# Patient Record
Sex: Female | Born: 1970 | Race: White | Hispanic: No | Marital: Married | Smoking: Former smoker
Health system: Southern US, Community
[De-identification: ages and names within clinical notes are randomized; demographics above are authoritative.]

## PROBLEM LIST (undated history)

## (undated) DIAGNOSIS — K219 Gastro-esophageal reflux disease without esophagitis: Secondary | ICD-10-CM

## (undated) DIAGNOSIS — I079 Rheumatic tricuspid valve disease, unspecified: Secondary | ICD-10-CM

## (undated) DIAGNOSIS — M797 Fibromyalgia: Secondary | ICD-10-CM

## (undated) DIAGNOSIS — D649 Anemia, unspecified: Secondary | ICD-10-CM

## (undated) DIAGNOSIS — I351 Nonrheumatic aortic (valve) insufficiency: Secondary | ICD-10-CM

## (undated) DIAGNOSIS — R112 Nausea with vomiting, unspecified: Secondary | ICD-10-CM

## (undated) DIAGNOSIS — G43909 Migraine, unspecified, not intractable, without status migrainosus: Secondary | ICD-10-CM

## (undated) DIAGNOSIS — Z8489 Family history of other specified conditions: Secondary | ICD-10-CM

## (undated) DIAGNOSIS — F32A Depression, unspecified: Secondary | ICD-10-CM

## (undated) DIAGNOSIS — E039 Hypothyroidism, unspecified: Secondary | ICD-10-CM

## (undated) DIAGNOSIS — K5792 Diverticulitis of intestine, part unspecified, without perforation or abscess without bleeding: Secondary | ICD-10-CM

## (undated) DIAGNOSIS — O039 Complete or unspecified spontaneous abortion without complication: Secondary | ICD-10-CM

## (undated) DIAGNOSIS — E079 Disorder of thyroid, unspecified: Secondary | ICD-10-CM

## (undated) DIAGNOSIS — Z9889 Other specified postprocedural states: Secondary | ICD-10-CM

## (undated) DIAGNOSIS — I2510 Atherosclerotic heart disease of native coronary artery without angina pectoris: Secondary | ICD-10-CM

## (undated) DIAGNOSIS — M1990 Unspecified osteoarthritis, unspecified site: Secondary | ICD-10-CM

## (undated) DIAGNOSIS — T7840XA Allergy, unspecified, initial encounter: Secondary | ICD-10-CM

## (undated) DIAGNOSIS — G5600 Carpal tunnel syndrome, unspecified upper limb: Secondary | ICD-10-CM

## (undated) DIAGNOSIS — J189 Pneumonia, unspecified organism: Secondary | ICD-10-CM

## (undated) DIAGNOSIS — I10 Essential (primary) hypertension: Secondary | ICD-10-CM

## (undated) DIAGNOSIS — F419 Anxiety disorder, unspecified: Secondary | ICD-10-CM

## (undated) DIAGNOSIS — F329 Major depressive disorder, single episode, unspecified: Secondary | ICD-10-CM

## (undated) HISTORY — DX: Essential (primary) hypertension: I10

## (undated) HISTORY — DX: Rheumatic tricuspid valve disease, unspecified: I07.9

## (undated) HISTORY — DX: Fibromyalgia: M79.7

## (undated) HISTORY — DX: Unspecified osteoarthritis, unspecified site: M19.90

## (undated) HISTORY — DX: Atherosclerotic heart disease of native coronary artery without angina pectoris: I25.10

## (undated) HISTORY — DX: Gastro-esophageal reflux disease without esophagitis: K21.9

## (undated) HISTORY — DX: Migraine, unspecified, not intractable, without status migrainosus: G43.909

## (undated) HISTORY — DX: Nonrheumatic aortic (valve) insufficiency: I35.1

## (undated) HISTORY — DX: Allergy, unspecified, initial encounter: T78.40XA

## (undated) HISTORY — DX: Diverticulitis of intestine, part unspecified, without perforation or abscess without bleeding: K57.92

## (undated) HISTORY — DX: Disorder of thyroid, unspecified: E07.9

## (undated) HISTORY — PX: DILATION AND CURETTAGE OF UTERUS: SHX78

## (undated) HISTORY — DX: Complete or unspecified spontaneous abortion without complication: O03.9

## (undated) HISTORY — PX: SIGMOIDECTOMY: SHX176

---

## 1992-11-30 HISTORY — PX: LAPAROSCOPY: SHX197

## 2003-05-17 ENCOUNTER — Inpatient Hospital Stay (HOSPITAL_COMMUNITY): Admission: AD | Admit: 2003-05-17 | Discharge: 2003-05-18 | Payer: Self-pay | Admitting: Obstetrics & Gynecology

## 2003-05-24 ENCOUNTER — Inpatient Hospital Stay (HOSPITAL_COMMUNITY): Admission: AD | Admit: 2003-05-24 | Discharge: 2003-05-24 | Payer: Self-pay | Admitting: Obstetrics and Gynecology

## 2003-06-18 ENCOUNTER — Other Ambulatory Visit: Admission: RE | Admit: 2003-06-18 | Discharge: 2003-06-18 | Payer: Self-pay | Admitting: Obstetrics & Gynecology

## 2004-10-01 ENCOUNTER — Other Ambulatory Visit: Admission: RE | Admit: 2004-10-01 | Discharge: 2004-10-01 | Payer: Self-pay | Admitting: Obstetrics and Gynecology

## 2005-01-15 ENCOUNTER — Ambulatory Visit: Payer: Self-pay | Admitting: Internal Medicine

## 2005-06-14 ENCOUNTER — Ambulatory Visit: Payer: Self-pay | Admitting: Internal Medicine

## 2005-07-20 ENCOUNTER — Ambulatory Visit: Payer: Self-pay | Admitting: Internal Medicine

## 2005-08-02 DIAGNOSIS — O039 Complete or unspecified spontaneous abortion without complication: Secondary | ICD-10-CM

## 2005-08-02 HISTORY — DX: Complete or unspecified spontaneous abortion without complication: O03.9

## 2005-09-06 ENCOUNTER — Ambulatory Visit: Payer: Self-pay | Admitting: Internal Medicine

## 2005-10-16 ENCOUNTER — Encounter (INDEPENDENT_AMBULATORY_CARE_PROVIDER_SITE_OTHER): Payer: Self-pay | Admitting: *Deleted

## 2005-10-16 ENCOUNTER — Ambulatory Visit (HOSPITAL_COMMUNITY): Admission: RE | Admit: 2005-10-16 | Discharge: 2005-10-16 | Payer: Self-pay | Admitting: Obstetrics and Gynecology

## 2005-12-17 ENCOUNTER — Ambulatory Visit: Payer: Self-pay | Admitting: Internal Medicine

## 2006-05-16 ENCOUNTER — Encounter: Admission: RE | Admit: 2006-05-16 | Discharge: 2006-05-16 | Payer: Self-pay | Admitting: Obstetrics and Gynecology

## 2007-01-02 ENCOUNTER — Ambulatory Visit: Payer: Self-pay | Admitting: Internal Medicine

## 2007-05-22 LAB — CONVERTED CEMR LAB: Pap Smear: NORMAL

## 2007-09-27 DIAGNOSIS — K219 Gastro-esophageal reflux disease without esophagitis: Secondary | ICD-10-CM | POA: Insufficient documentation

## 2007-09-28 ENCOUNTER — Ambulatory Visit: Payer: Self-pay | Admitting: Internal Medicine

## 2007-11-09 ENCOUNTER — Ambulatory Visit: Payer: Self-pay | Admitting: Internal Medicine

## 2007-12-04 ENCOUNTER — Ambulatory Visit: Payer: Self-pay | Admitting: Internal Medicine

## 2007-12-04 ENCOUNTER — Telehealth: Payer: Self-pay | Admitting: Internal Medicine

## 2007-12-19 ENCOUNTER — Telehealth: Payer: Self-pay | Admitting: Internal Medicine

## 2007-12-19 DIAGNOSIS — J3089 Other allergic rhinitis: Secondary | ICD-10-CM | POA: Insufficient documentation

## 2007-12-19 DIAGNOSIS — J309 Allergic rhinitis, unspecified: Secondary | ICD-10-CM

## 2007-12-21 ENCOUNTER — Telehealth: Payer: Self-pay | Admitting: Internal Medicine

## 2008-01-30 ENCOUNTER — Telehealth: Payer: Self-pay | Admitting: Internal Medicine

## 2008-02-05 ENCOUNTER — Ambulatory Visit: Payer: Self-pay | Admitting: Internal Medicine

## 2008-02-05 LAB — CONVERTED CEMR LAB
Eosinophils Relative: 3.2 % (ref 0.0–5.0)
Lymphocytes Relative: 26.9 % (ref 12.0–46.0)
Monocytes Relative: 6.4 % (ref 3.0–12.0)
Neutrophils Relative %: 62.6 % (ref 43.0–77.0)
Platelets: 282 10*3/uL (ref 150–400)
WBC: 6.5 10*3/uL (ref 4.5–10.5)

## 2008-02-23 ENCOUNTER — Ambulatory Visit: Payer: Self-pay | Admitting: Internal Medicine

## 2008-04-13 ENCOUNTER — Inpatient Hospital Stay (HOSPITAL_COMMUNITY): Admission: AD | Admit: 2008-04-13 | Discharge: 2008-04-17 | Payer: Self-pay | Admitting: Internal Medicine

## 2008-04-13 ENCOUNTER — Ambulatory Visit: Payer: Self-pay | Admitting: Internal Medicine

## 2008-04-13 ENCOUNTER — Encounter: Payer: Self-pay | Admitting: Emergency Medicine

## 2008-04-24 ENCOUNTER — Ambulatory Visit: Payer: Self-pay | Admitting: Internal Medicine

## 2008-04-24 ENCOUNTER — Telehealth: Payer: Self-pay | Admitting: Internal Medicine

## 2008-04-24 LAB — CONVERTED CEMR LAB
Basophils Relative: 1.2 % (ref 0.0–3.0)
Eosinophils Absolute: 0.2 10*3/uL (ref 0.0–0.7)
Eosinophils Relative: 2.9 % (ref 0.0–5.0)
HCT: 38.3 % (ref 36.0–46.0)
MCV: 86.1 fL (ref 78.0–100.0)
Monocytes Absolute: 0.5 10*3/uL (ref 0.1–1.0)
Monocytes Relative: 7.3 % (ref 3.0–12.0)
RBC: 4.45 M/uL (ref 3.87–5.11)
WBC: 7 10*3/uL (ref 4.5–10.5)

## 2008-04-30 ENCOUNTER — Ambulatory Visit: Payer: Self-pay | Admitting: Internal Medicine

## 2008-05-01 ENCOUNTER — Ambulatory Visit (HOSPITAL_BASED_OUTPATIENT_CLINIC_OR_DEPARTMENT_OTHER): Admission: RE | Admit: 2008-05-01 | Discharge: 2008-05-01 | Payer: Self-pay | Admitting: Internal Medicine

## 2008-05-01 ENCOUNTER — Telehealth: Payer: Self-pay | Admitting: Internal Medicine

## 2008-05-02 ENCOUNTER — Ambulatory Visit: Payer: Self-pay | Admitting: Gastroenterology

## 2008-05-02 ENCOUNTER — Encounter: Payer: Self-pay | Admitting: Nurse Practitioner

## 2008-05-02 LAB — CONVERTED CEMR LAB: Tissue Transglutaminase Ab, IgA: 1 units (ref <7)

## 2008-05-20 ENCOUNTER — Telehealth: Payer: Self-pay | Admitting: Gastroenterology

## 2008-05-21 ENCOUNTER — Ambulatory Visit: Payer: Self-pay | Admitting: Gastroenterology

## 2008-05-21 LAB — CONVERTED CEMR LAB
Basophils Relative: 0.5 % (ref 0.0–3.0)
HCT: 36.7 % (ref 36.0–46.0)
Hemoglobin: 12.6 g/dL (ref 12.0–15.0)
MCHC: 34.3 g/dL (ref 30.0–36.0)
Monocytes Absolute: 0.5 10*3/uL (ref 0.1–1.0)
Monocytes Relative: 6.3 % (ref 3.0–12.0)
Neutro Abs: 5 10*3/uL (ref 1.4–7.7)
RBC: 4.28 M/uL (ref 3.87–5.11)
RDW: 12.2 % (ref 11.5–14.6)
Sed Rate: 10 mm/hr (ref 0–22)

## 2008-05-29 ENCOUNTER — Ambulatory Visit: Payer: Self-pay | Admitting: Cardiovascular Disease

## 2008-05-31 ENCOUNTER — Telehealth: Payer: Self-pay | Admitting: Gastroenterology

## 2008-09-16 ENCOUNTER — Ambulatory Visit: Payer: Self-pay | Admitting: Internal Medicine

## 2008-10-04 ENCOUNTER — Ambulatory Visit: Payer: Self-pay | Admitting: Gastroenterology

## 2008-10-07 ENCOUNTER — Telehealth: Payer: Self-pay | Admitting: Internal Medicine

## 2008-10-08 ENCOUNTER — Telehealth: Payer: Self-pay | Admitting: Gastroenterology

## 2008-10-08 ENCOUNTER — Ambulatory Visit: Payer: Self-pay | Admitting: Internal Medicine

## 2008-10-11 ENCOUNTER — Ambulatory Visit: Payer: Self-pay | Admitting: Gastroenterology

## 2008-12-26 ENCOUNTER — Encounter (INDEPENDENT_AMBULATORY_CARE_PROVIDER_SITE_OTHER): Payer: Self-pay | Admitting: General Surgery

## 2008-12-26 ENCOUNTER — Inpatient Hospital Stay (HOSPITAL_COMMUNITY): Admission: RE | Admit: 2008-12-26 | Discharge: 2009-01-01 | Payer: Self-pay | Admitting: General Surgery

## 2009-01-13 ENCOUNTER — Telehealth: Payer: Self-pay | Admitting: Gastroenterology

## 2009-01-17 ENCOUNTER — Telehealth (INDEPENDENT_AMBULATORY_CARE_PROVIDER_SITE_OTHER): Payer: Self-pay | Admitting: *Deleted

## 2009-04-29 ENCOUNTER — Telehealth: Payer: Self-pay | Admitting: Internal Medicine

## 2009-05-05 ENCOUNTER — Encounter: Payer: Self-pay | Admitting: Gastroenterology

## 2009-05-05 ENCOUNTER — Ambulatory Visit (HOSPITAL_BASED_OUTPATIENT_CLINIC_OR_DEPARTMENT_OTHER): Admission: RE | Admit: 2009-05-05 | Discharge: 2009-05-05 | Payer: Self-pay | Admitting: General Surgery

## 2009-05-05 ENCOUNTER — Ambulatory Visit: Payer: Self-pay | Admitting: Diagnostic Radiology

## 2009-05-09 ENCOUNTER — Ambulatory Visit: Payer: Self-pay | Admitting: Internal Medicine

## 2009-05-19 ENCOUNTER — Encounter: Payer: Self-pay | Admitting: Internal Medicine

## 2009-07-01 ENCOUNTER — Telehealth: Payer: Self-pay | Admitting: Internal Medicine

## 2009-07-31 ENCOUNTER — Ambulatory Visit: Payer: Self-pay | Admitting: Gastroenterology

## 2009-07-31 LAB — CONVERTED CEMR LAB
AST: 16 units/L (ref 0–37)
Albumin: 4.3 g/dL (ref 3.5–5.2)
BUN: 9 mg/dL (ref 6–23)
Basophils Relative: 0.6 % (ref 0.0–3.0)
CRP, High Sensitivity: 0.9 (ref 0.00–5.00)
Calcium: 9.5 mg/dL (ref 8.4–10.5)
Creatinine, Ser: 0.7 mg/dL (ref 0.4–1.2)
Eosinophils Absolute: 0.2 10*3/uL (ref 0.0–0.7)
Eosinophils Relative: 3.4 % (ref 0.0–5.0)
Folate: 10.1 ng/mL
GFR calc non Af Amer: 99.79 mL/min (ref >60)
Glucose, Bld: 85 mg/dL (ref 70–99)
HCT: 39.9 % (ref 36.0–46.0)
Hemoglobin: 13.6 g/dL (ref 12.0–15.0)
Iron: 92 ug/dL (ref 42–145)
Lymphs Abs: 1.2 10*3/uL (ref 0.7–4.0)
MCHC: 34 g/dL (ref 30.0–36.0)
MCV: 89 fL (ref 78.0–100.0)
Monocytes Absolute: 0.4 10*3/uL (ref 0.1–1.0)
Neutro Abs: 3.5 10*3/uL (ref 1.4–7.7)
Neutrophils Relative %: 66.2 % (ref 43.0–77.0)
RBC: 4.48 M/uL (ref 3.87–5.11)
TSH: 3.58 microintl units/mL (ref 0.35–5.50)
Total Bilirubin: 0.5 mg/dL (ref 0.3–1.2)
Vitamin B-12: 265 pg/mL (ref 211–911)
WBC: 5.3 10*3/uL (ref 4.5–10.5)

## 2009-08-15 ENCOUNTER — Ambulatory Visit: Payer: Self-pay | Admitting: Gastroenterology

## 2009-08-15 ENCOUNTER — Telehealth: Payer: Self-pay | Admitting: Gastroenterology

## 2009-08-15 LAB — HM COLONOSCOPY

## 2010-02-10 ENCOUNTER — Ambulatory Visit: Payer: Self-pay | Admitting: Internal Medicine

## 2010-05-18 ENCOUNTER — Ambulatory Visit: Payer: Self-pay | Admitting: Internal Medicine

## 2010-05-18 DIAGNOSIS — J209 Acute bronchitis, unspecified: Secondary | ICD-10-CM | POA: Insufficient documentation

## 2010-05-26 ENCOUNTER — Telehealth: Payer: Self-pay | Admitting: Internal Medicine

## 2010-05-26 ENCOUNTER — Ambulatory Visit: Payer: Self-pay | Admitting: Internal Medicine

## 2010-05-26 DIAGNOSIS — J45909 Unspecified asthma, uncomplicated: Secondary | ICD-10-CM | POA: Insufficient documentation

## 2010-05-28 ENCOUNTER — Ambulatory Visit: Payer: Self-pay | Admitting: Internal Medicine

## 2010-05-30 ENCOUNTER — Inpatient Hospital Stay (HOSPITAL_COMMUNITY): Admission: AD | Admit: 2010-05-30 | Discharge: 2010-05-30 | Payer: Self-pay | Admitting: Obstetrics and Gynecology

## 2010-05-30 ENCOUNTER — Ambulatory Visit: Payer: Self-pay | Admitting: Obstetrics & Gynecology

## 2010-06-12 ENCOUNTER — Inpatient Hospital Stay (HOSPITAL_COMMUNITY): Admission: AD | Admit: 2010-06-12 | Discharge: 2010-06-13 | Payer: Self-pay | Admitting: Obstetrics and Gynecology

## 2010-06-25 ENCOUNTER — Encounter (INDEPENDENT_AMBULATORY_CARE_PROVIDER_SITE_OTHER): Payer: Self-pay | Admitting: Obstetrics and Gynecology

## 2010-06-25 ENCOUNTER — Inpatient Hospital Stay (HOSPITAL_COMMUNITY): Admission: AD | Admit: 2010-06-25 | Discharge: 2010-06-27 | Payer: Self-pay | Admitting: Obstetrics and Gynecology

## 2010-06-30 ENCOUNTER — Ambulatory Visit: Payer: Self-pay | Admitting: Internal Medicine

## 2010-06-30 DIAGNOSIS — M546 Pain in thoracic spine: Secondary | ICD-10-CM | POA: Insufficient documentation

## 2010-06-30 DIAGNOSIS — J069 Acute upper respiratory infection, unspecified: Secondary | ICD-10-CM | POA: Insufficient documentation

## 2010-09-03 NOTE — Procedures (Signed)
Summary: Colonoscopy  Patient: [name] Note: All result statuses are Final unless otherwise noted.  Tests: (1) Colonoscopy (COL)   COL Colonoscopy           DONE     [facility]     [address]     [place], [place]  [address]           COLONOSCOPY PROCEDURE REPORT           PATIENT:  Christine [name], Christine [name]  MR#:  [id number]     BIRTHDATE:  02/04/1971, 38 yrs. old  GENDER:  female           ENDOSCOPIST:  Vania Rea. Jarold Motto, MD, [facility]     Referred by:           PROCEDURE DATE:  08/15/2009     PROCEDURE:  Colonoscopy, Diagnostic     ASA CLASS:  Class II     INDICATIONS:  abdominal pain S/P SIGMOID RESECTION FOR     DIVERTICULITIS.           MEDICATIONS:   Fentanyl 50 mcg IV, Versed 6 mg IV           DESCRIPTION OF PROCEDURE:   After the risks benefits and     alternatives of the procedure were thoroughly explained, informed     consent was obtained.  Digital rectal exam was performed and     revealed no abnormalities.   The LB CF-[id number] endoscope     was introduced through the anus and advanced to the terminal ileum     which was intubated for a short distance, without limitations.     The quality of the prep was excellent, using MoviPrep.  The     instrument was then slowly withdrawn as the colon was fully     examined.     <<PROCEDUREIMAGES>>     FINDINGS:  There was a surgical anastomosis. WIDELY PATENT AND NOT     ULCERATED OR STENOTIC.  The terminal ileum appeared normal. SI     EXAMNED FOR 30 CM.  This was otherwise a normal examination of the     colon.  No polyps or cancers were seen.   Retroflexed vie     ws in the rectum revealed no abnormalities.    The scope was then     withdrawn from the patient and the procedure completed.           COMPLICATIONS:  None           ENDOSCOPIC IMPRESSION:     1) Anastomosis     2) Normal terminal ileum     3) Otherwise normal examination     4) No polyps or cancers     CONSTIPATION PREDOMINANT IBS.   RECOMMENDATIONS:     1) continue current medications           REPEAT EXAM:  No           ______________________________     Vania Rea. Jarold Motto, MD, Clementeen Graham           CC:  Thomos Lemons, DO           n.     eSIGNED:   Vania Rea. Nabria Nevin at 08/15/2009 03:46 PM           Valentina Gu, [id number]  Note: An exclamation mark (!) indicates a result that was not dispersed into the flowsheet. Document Creation Date: 08/15/2009 3:46 PM _______________________________________________________________________  (1)  Order result status: Final Collection or observation date-time: 08/15/2009 15:36 Requested date-time:  Receipt date-time:  Reported date-time:  Referring Physician:   Ordering Physician: Sheryn Bison [phone]) Specimen Source:  Source: Launa Grill Order Number: [id number] Lab site:

## 2010-09-03 NOTE — Progress Notes (Signed)
Summary: Status Update  Phone Note Call from Patient Call back at [phone]   Caller: Patient Call For: D. Thomos Lemons DO Summary of Call: patient called and left voice message stating she has completed the antibiotic that was given to her and she is not feeling any better. Her message states her cough has gotten worse and she is starting to have breathing difficulty. She states she is [redacted] weeks pregnant and not sure if there is anything else that she could take. Please advise Initial call taken by: Glendell Docker CMA,  May 26, 2010 11:25 AM  Follow-up for Phone Call        I suggest OV Follow-up by: D. Thomos Lemons DO,  May 26, 2010 1:26 PM  Additional Follow-up for Phone Call Additional follow up Details #1::        call returned to patient at [phone], she has been advised per Dr Artist Pais instructions. Appt. scheduled with Melissa @ 3:45pm Additional Follow-up by: Glendell Docker CMA,  May 26, 2010 2:08 PM

## 2010-09-03 NOTE — Progress Notes (Signed)
Summary: GYN appt  Phone Note Outgoing Call   Summary of Call: Per Dr. Jarold Motto,  Call pt and find out name of GYN.  Send records.  Pt has appt and will need laporoscopy.  Colon normal. Initial call taken by: Ashok Cordia RN,  August 15, 2009 3:54 PM  Follow-up for Phone Call        Left messasge for pt to call. Lupita Leash Surface RN  August 19, 2009 10:58 AM  Pt states she will be seeing Dr. Huntley Dec at [facility].  Records faxed. Follow-up by: Ashok Cordia RN,  August 19, 2009 4:28 PM

## 2010-09-03 NOTE — Assessment & Plan Note (Signed)
Summary: 2 day fu/dt   Vital Signs:  Patient profile:   40 year old female O2 Sat:      98 % Temp:     97.9 degrees F oral Pulse rate:   88 / minute Pulse rhythm:   regular Resp:     22 per minute BP sitting:   118 / 80  (right arm) Cuff size:   large  Vitals Entered By: Glendell Docker CMA (May 28, 2010 11:42 AM) CC: follow-up visit Is Patient Diabetic? No Pain Assessment Patient in pain? no      Comments symptoms have improved some, still bothered by cough, wheezing and shortness of breath   Primary Care Provider:  Dondra Spry DO  CC:  follow-up visit.  History of Present Illness: 40 y/o white female for f/u wheezing and SOB much better able to sleep last night w/o difficulty  no side effects from advair  she reports bad reflux symptoms worse over last 1 month taking occ tums  Allergies: 1)  ! Pcn 2)  ! Hydrocodone  Past History:  Past Medical History: Migraine Headache Miscarriage 2007  rhinitis -Skin test POS 02/23/08     Recurrent Diverticulitis   Allergic rhinitis   Past Surgical History: 5/94 Laproscopy x 2 miscarriage   - D & C  Hand-assisted laparoscopic sigmoidectomy with   splenic flexure takedown.     Family History: Family history of alcoholism, arthritis, hyperlipidemia, CAD, stroke, Htn Cousin has Crohn's disease Mother-Barrett's Esophagus No FH of Colon Cancer: Family History of Clotting disorder: Great Grandfather Family History of Colon Polyps: Father Family History of Diabetes: Father, Mother, MGF      Social History: Married, 2 daughters (9, 53) Former smoker.   Occupation: Housewife  Alcohol Use - yes   Daily Caffeine Use  Illicit Drug Use - no Patient does not get regular exercise.   Physical Exam  General:  alert, well-developed, and well-nourished.   Lungs:  normal respiratory effort.  faint ant expiratory wheeze Heart:  normal rate, regular rhythm, and no gallop.     Impression &  Recommendations:  Problem # 1:  REACTIVE AIRWAY DISEASE (ICD-493.90) Assessment Improved continue using advair x 1-2 weeks no need for prednisone for now reflux is likely exacerbating wheezing.   pt will use OTC zantac 150 mg once to twice per day  anti reflux measures reviewed  Her updated medication list for this problem includes:    Advair Diskus 250-50 Mcg/dose Aepb (Fluticasone-salmeterol) ..... One puff twice daily1  Problem # 2:  GASTROESOPHAGEAL REFLUX DISEASE (ICD-530.81)  use otc zantac as needed anti relux measures  Labs Reviewed: Hgb: 13.6 (07/31/2009)   Hct: 39.9 (07/31/2009)  Her updated medication list for this problem includes:    Ranitidine Hcl 150 Mg Tabs (Ranitidine hcl) ..... One by mouth two times a day as needed  Complete Medication List: 1)  Advair Diskus 250-50 Mcg/dose Aepb (Fluticasone-salmeterol) .... One puff twice daily1 2)  Ranitidine Hcl 150 Mg Tabs (Ranitidine hcl) .... One by mouth two times a day as needed  Patient Instructions: 1)  Raise head of bed at least 8 inches 2)  Avoid caffeine 3)  Low protein, low fat meal at night 4)  Call our office if your symptoms do not  improve or gets worse.   Orders Added: 1)  Est. Patient Level III [[address]]    Current Allergies (reviewed today): ! PCN ! HYDROCODONE

## 2010-09-03 NOTE — Assessment & Plan Note (Signed)
Summary: uri or sinus infection?/mhf   Vital Signs:  Patient profile:   40 year old female Height:      69 inches Weight:      204.25 pounds BMI:     30.27 O2 Sat:      98 % on Room air Temp:     98.0 degrees F oral Pulse rate:   92 / minute Pulse rhythm:   regular Resp:     20 per minute BP sitting:   116 / 74  (right arm) Cuff size:   large  Vitals Entered By: Glendell Docker CMA (February 10, 2010 10:07 AM)  O2 Flow:  Room air CC: Rm 3-URI ? Is Patient Diabetic? No   Primary Care Provider:  Dondra Spry DO  CC:  Rm 3-URI ?Marland Kitchen  History of Present Illness: 40 y/o  female c/o left ear pain, head congestion, eye irritation, productive cough and nasal drainage green in color, cough for the past week  ear ache for the past 2 days, currently [redacted] weeks pregnant ( somewhat unexpected but pleeasant surprise ) Daughter - abby had uri  Preventive Screening-Counseling & Management  Alcohol-Tobacco     Smoking Status: quit  Allergies: 1)  ! Pcn 2)  ! Hydrocodone  Past History:  Past Medical History: Migraine Headache Miscarriage 2007 rhinitis -Skin test POS 02/23/08     Recurrent Diverticulitis  Allergic rhinitis  Social History: Married, 2 daughters (9, 59) Former smoker.   Occupation: Housewife  Alcohol Use - yes  Daily Caffeine Use Illicit Drug Use - no Patient does not get regular exercise.   Physical Exam  General:  alert and well-developed.   Ears:  left ear retracted and slightly red Neck:  supple and no masses.   Lungs:  normal respiratory effort and normal breath sounds.   Heart:  normal rate, regular rhythm, and no gallop.     Impression & Recommendations:  Problem # 1:  ACUTE SEROUS OTITIS MEDIA (ICD-381.01) take abx as directed.  remote hx of allergy to PCN (rash).  she has tolerated ceftin in the past. she is 21 weeks preg.  ceftin is cat b  Complete Medication List: 1)  Cefuroxime Axetil 500 Mg Tabs (Cefuroxime axetil) .... One by mouth two times  a day  Patient Instructions: 1)  Call our office if your symptoms do not  improve or gets worse. Prescriptions: CEFUROXIME AXETIL 500 MG TABS (CEFUROXIME AXETIL) one by mouth two times a day  #20 x 0   Entered and Authorized by:   D. Thomos Lemons DO   Signed by:   D. Thomos Lemons DO on 02/10/2010   Method used:   [facility] to        [facility] [phone]* (retail)       [address]       [place], [place]  [address]       Ph: [phone]       Fax: [phone]   RxID:   [id number] CEFUROXIME AXETIL 500 MG TABS (CEFUROXIME AXETIL) one by mouth two times a day  #20 x 0   Entered and Authorized by:   D. Thomos Lemons DO   Signed by:   D. Thomos Lemons DO on 02/10/2010   Method used:   Electronically to        [facility] [address]* (retail)       [address]  [address]       [place], [place]  [address]       Ph: [phone] or [phone]       Fax: [phone]   RxID:   [id number]   Current Allergies (reviewed today): ! PCN ! HYDROCODONE   Preventive Care Screening  Pap Smear:    Date:  09/29/2009    Results:  normal

## 2010-09-03 NOTE — Assessment & Plan Note (Signed)
Summary: COUGH & SOB/DK--Rm 5   Vital Signs:  Patient profile:   40 year old female Height:      69 inches Weight:      229 pounds BMI:     33.94 O2 Sat:      97 % on Room air Temp:     98.0 degrees F oral Pulse rate:   86 / minute Pulse rhythm:   regular Resp:     16 per minute BP sitting:   138 / 84  (right arm) Cuff size:   large  Vitals Entered By: Mervin Kung CMA Duncan Dull) (May 26, 2010 3:49 PM)  O2 Flow:  Room air CC: Rm 5  Pt states she has completed abx for bronchitis but the cough feels worse. Is Patient Diabetic? No Pain Assessment Patient in pain? no        Primary Care Maysin Carstens:  Dondra Spry DO  CC:  Rm 5  Pt states she has completed abx for bronchitis but the cough feels worse.Marland Kitchen  History of Present Illness: Ms Bicknell is a 40 year old pregnant female (37 weeks) who presents today for follow up of her bronchtitis.  She saw Dr. Artist Pais on 10/17 and was prescribed cefuroxime which she completed 2 days ago.  She continues to cough.  Cough is wet and productive at times of greenish colored sputum.  Denies fever.  She is followed by Dr. Huntley Dec ([facility]) and has an appointment scheduled for tomorrow.    Allergies: 1)  ! Pcn 2)  ! Hydrocodone  Past History:  Past Medical History: Last updated: 05/18/2010 Migraine Headache Miscarriage 2007 rhinitis -Skin test POS 02/23/08     Recurrent Diverticulitis   Allergic rhinitis  Past Surgical History: Last updated: 05/18/2010 5/94 Laproscopy x 2 miscarriage   - D & C  Hand-assisted laparoscopic sigmoidectomy with   splenic flexure takedown.    Review of Systems       see HPI  Physical Exam  General:  Well-developed,well-nourished,in no acute distress; alert,appropriate and cooperative throughout examination Head:  Normocephalic and atraumatic without obvious abnormalities. No apparent alopecia or balding. Neck:  No deformities, masses, or tenderness noted. Lungs:  Bilateral expiratory  wheeze, no increased work of breathing. No accessory muscle use.   Heart:  Normal rate and regular rhythm. S1 and S2 normal without gallop, murmur, click, rub or other extra sounds. Extremities:  No clubbing, cyanosis, edema, or deformity noted    Impression & Recommendations:  Problem # 1:  REACTIVE AIRWAY DISEASE (ICD-493.90) Assessment New No history of asthma.  Suspect reactive airway disease in setting of recent bronchitis.  Patient was given nebulizer in the office with significant improvement noted in wheezing post-neb.  No hypoxia. No fever.  Will plan to treat with Advair and close follow up.  If not improvement with Advair, will need oral steroids.   Case was discussed with Dr. Artist Pais.  Her updated medication list for this problem includes:    Advair Diskus 250-50 Mcg/dose Aepb (Fluticasone-salmeterol) ..... One puff twice daily1  Complete Medication List: 1)  Advair Diskus 250-50 Mcg/dose Aepb (Fluticasone-salmeterol) .... One puff twice daily1  Other Orders: Nebulizer Tx ([address]) Albuterol Sulfate Sol 1mg  unit dose (U0454)  Patient Instructions: 1)  Follwo up with with Dr. Artist Pais on Thursday of this week. 2)  Call if your symptoms worsen or do not improve. 3)  Go to ER if you develop severe shortness of breath not relieved by advair.  Prescriptions: ADVAIR DISKUS  250-50 MCG/DOSE AEPB (FLUTICASONE-SALMETEROL) one puff twice daily1  #2 x 0   Entered and Authorized by:   Lemont Fillers FNP   Signed by:   Lemont Fillers FNP on 05/26/2010   Method used:   Samples Given   RxID:   [id number]    Medication Administration  Medication # 1:    Medication: Albuterol Sulfate Sol 1mg  unit dose    Diagnosis: ACUTE BRONCHITIS (ICD-466.0)    Dose: 2.5mg  / 3 mL    Route: inhaled    Exp Date: 01/30/2011    Lot #: [id number]    Mfr: [employer]    Patient tolerated medication without complications    Given by: Mervin Kung CMA Duncan Dull) (May 26, 2010 4:17  PM)  Orders Added: 1)  Nebulizer Tx [[address]] 2)  Albuterol Sulfate Sol 1mg  unit dose [J7613] 3)  Est. Patient Level III [[address]] a   Current Allergies (reviewed today): ! PCN ! HYDROCODONE

## 2010-09-03 NOTE — Assessment & Plan Note (Signed)
Summary: cough & congestion/dt   Vital Signs:  Patient profile:   40 year old female Height:      69 inches Weight:      226 pounds BMI:     33.50 O2 Sat:      98 % on Room air Temp:     98.1 degrees F oral Pulse rate:   78 / minute Pulse rhythm:   regular Resp:     18 per minute BP sitting:   116 / 80  (right arm) Cuff size:   regular  Vitals Entered By: Glendell Docker CMA (May 18, 2010 11:40 AM)  O2 Flow:  Room air CC: Cough Is Patient Diabetic? No Pain Assessment Patient in pain? no        Primary Care Provider:  Dondra Spry DO  CC:  Cough.  History of Present Illness: 40 y/o white female c/o productive cough green in color for the past week, taken dimetap with no improvement. cough is worse at night. [redacted] weeks pregnant denies sinus congestion or pain no fever  no SOB  Preventive Screening-Counseling & Management  Alcohol-Tobacco     Smoking Status: quit  Allergies: 1)  ! Pcn 2)  ! Hydrocodone  Past History:  Past Medical History: Migraine Headache Miscarriage 2007 rhinitis -Skin test POS 02/23/08     Recurrent Diverticulitis   Allergic rhinitis  Past Surgical History: 5/94 Laproscopy x 2 miscarriage   - D & C  Hand-assisted laparoscopic sigmoidectomy with   splenic flexure takedown.    Physical Exam  General:  alert, well-developed, and well-nourished.   Ears:  left TM slightly retracted and red Lungs:  normal respiratory effort and normal breath sounds.   Heart:  normal rate, regular rhythm, and no gallop.     Impression & Recommendations:  Problem # 1:  ACUTE BRONCHITIS (ICD-466.0)  The following medications were removed from the medication list:    Cefuroxime Axetil 500 Mg Tabs (Cefuroxime axetil) ..... One by mouth two times a day Her updated medication list for this problem includes:    Cefuroxime Axetil 500 Mg Tabs (Cefuroxime axetil) ..... One by mouth two times a day  Take antibiotics and other medications as directed.  Encouraged to push clear liquids, get enough rest, and take acetaminophen as needed. To be seen in 5-7 days if no improvement, sooner if worse.  Complete Medication List: 1)  Cefuroxime Axetil 500 Mg Tabs (Cefuroxime axetil) .... One by mouth two times a day  Patient Instructions: 1)  Call our office if your symptoms do not  improve or gets worse. Prescriptions: CEFUROXIME AXETIL 500 MG TABS (CEFUROXIME AXETIL) one by mouth two times a day  #14 x 0   Entered and Authorized by:   D. Thomos Lemons DO   Signed by:   D. Thomos Lemons DO on 05/18/2010   Method used:   [facility]        [facility] [phone]* (retail)       [address]       [place], [place]  [address]       Ph: [phone]       Fax: [phone]   RxID:   [id number]    Orders Added: 1)  Est. Patient Level III [[address]]   Immunization History:  Influenza Immunization History:    Influenza:  historical (05/05/2010)   Immunization History:  Influenza Immunization History:    Influenza:  Historical (05/05/2010)  Current Allergies (reviewed today): ! PCN ! HYDROCODONE

## 2010-09-03 NOTE — Assessment & Plan Note (Signed)
Summary: cough and congestion/mhf   Vital Signs:  Patient profile:   40 year old female Height:      69 inches Weight:      217.25 pounds O2 Sat:      97 % on Room air Temp:     98.6 degrees F oral Pulse rate:   65 / minute Resp:     20 per minute BP sitting:   142 / 90  (right arm) Cuff size:   regular  Vitals Entered By: Glendell Docker CMA (June 30, 2010 3:42 PM)  O2 Flow:  Room air CC: Cough & Congestion Is Patient Diabetic? No Pain Assessment Patient in pain? no        Primary Care Ranferi Clingan:  Dondra Spry DO  CC:  Cough & Congestion.  History of Present Illness: 40 y/o white female c/o productive cough, head,nasal congestion, back pain between shoulder blade, Delivered 11/24, denies temp, has not checked, has been taking Motrin around the clock since delivery, she is breast feeding heartburn is much better since delivering her baby baby doing well  pt c/o mid thoracic back pain pt holding baby to breast feed  Allergies: 1)  ! Pcn 2)  ! Hydrocodone  Past History:  Past Medical History: Migraine Headache Miscarriage 2007  rhinitis -Skin test POS 02/23/08     Recurrent Diverticulitis    Allergic rhinitis   Past Surgical History: 5/94 Laproscopy x 2 miscarriage   - D & C  Hand-assisted laparoscopic sigmoidectomy with   splenic flexure takedown.      Social History: Married, 2 daughters (9, 54) Former smoker.   Occupation: Housewife   Alcohol Use - yes   Daily Caffeine Use  Illicit Drug Use - no Patient does not get regular exercise.   Physical Exam  General:  alert, well-developed, and well-nourished.   Ears:  Rt and Lt TM retracted,  no redness Lungs:  normal respiratory effort and normal breath sounds.   Heart:  normal rate, regular rhythm, and no gallop.   Msk:  T7-T9 rotated left -  increased muscle tone in left thoracic spine   Impression & Recommendations:  Problem # 1:  URI (ICD-465.9)  Instructed on symptomatic treatment.  Call if symptoms persist or worsen.   Problem # 2:  REACTIVE AIRWAY DISEASE (ICD-493.90) Assessment: Improved stopped using advair 2 weeks ago.  some return of wheezing and cough restart advair  Her updated medication list for this problem includes:    Advair Diskus 250-50 Mcg/dose Aepb (Fluticasone-salmeterol) ..... One puff twice daily  Problem # 3:  BACK PAIN, THORACIC REGION, LEFT (ICD-724.1) Assessment: New pain from breast feeding baby utilized myofascial release techniques and HVLA to T Spine pt tolerated well.    Patient advised to call office if symptoms persist or worsen.  Orders: OMT 1-2 Body Regions [id number])  Complete Medication List: 1)  Advair Diskus 250-50 Mcg/dose Aepb (Fluticasone-salmeterol) .... One puff twice daily 2)  Ranitidine Hcl 150 Mg Tabs (Ranitidine hcl) .... One by mouth two times a day as needed 3)  Prenatal Vitamins 0.8 Mg Tabs (Prenatal multivit-min-fe-fa) .... Take 1 tablet by mouth once a day 4)  Stool Softener 100 Mg Caps (Docusate sodium) .Marland Kitchen.. 1-2 capsules by mouth once daily as needed  Patient Instructions: 1)  Call our office if your symptoms do not  improve or gets worse. 2)  Please schedule a follow-up appointment in 3 months. Prescriptions: ADVAIR DISKUS 250-50 MCG/DOSE AEPB (FLUTICASONE-SALMETEROL) one puff twice daily  #  1 x 3   Entered and Authorized by:   D. Thomos Lemons DO   Signed by:   D. Thomos Lemons DO on 06/30/2010   Method used:   [facility]ron[facility] to        [facility] [phone]* (retail)       [address]       [place], [place]  [address]       Ph: [phone]       Fax: [phone]   RxID:   [id number]    Orders Added: 1)  OMT 1-2 Body Regions [98925] 2)  Est. Patient Level III [244[address]]

## 2010-10-13 LAB — COMPREHENSIVE METABOLIC PANEL
AST: 26 U/L (ref 0–37)
Albumin: 2.8 g/dL — ABNORMAL LOW (ref 3.5–5.2)
Alkaline Phosphatase: 130 U/L — ABNORMAL HIGH (ref 39–117)
BUN: 6 mg/dL (ref 6–23)
CO2: 21 mEq/L (ref 19–32)
Chloride: 104 mEq/L (ref 96–112)
Creatinine, Ser: 0.58 mg/dL (ref 0.4–1.2)
GFR calc Af Amer: >60 mL/min (ref >60)
GFR calc non Af Amer: >60 mL/min (ref >60)
Potassium: 4.3 mEq/L (ref 3.5–5.1)
Total Bilirubin: 0.5 mg/dL (ref 0.3–1.2)

## 2010-10-13 LAB — CBC
HCT: 34.9 % — ABNORMAL LOW (ref 36.0–46.0)
Hemoglobin: 12.1 g/dL (ref 12.0–15.0)
Hemoglobin: 14.1 g/dL (ref 12.0–15.0)
MCH: 31.7 pg (ref 26.0–34.0)
MCH: 32.4 pg (ref 26.0–34.0)
MCHC: 34.8 g/dL (ref 30.0–36.0)
MCV: 92.4 fL (ref 78.0–100.0)
Platelets: 169 10*3/uL (ref 150–400)
RBC: 4.46 MIL/uL (ref 3.87–5.11)
RDW: 14 % (ref 11.5–15.5)
WBC: 9.5 10*3/uL (ref 4.0–10.5)

## 2010-10-14 LAB — URINALYSIS, ROUTINE W REFLEX MICROSCOPIC
Bilirubin Urine: NEGATIVE
Glucose, UA: NEGATIVE mg/dL
Ketones, ur: NEGATIVE mg/dL
Protein, ur: NEGATIVE mg/dL
Urobilinogen, UA: 0.2 mg/dL (ref 0.0–1.0)

## 2010-10-14 LAB — CBC
MCH: 31.8 pg (ref 26.0–34.0)
MCHC: 34.7 g/dL (ref 30.0–36.0)
Platelets: 185 10*3/uL (ref 150–400)
RBC: 4.13 MIL/uL (ref 3.87–5.11)
RDW: 13.7 % (ref 11.5–15.5)

## 2010-10-14 LAB — COMPREHENSIVE METABOLIC PANEL
AST: 23 U/L (ref 0–37)
Albumin: 2.8 g/dL — ABNORMAL LOW (ref 3.5–5.2)
Calcium: 9.2 mg/dL (ref 8.4–10.5)
Chloride: 101 mEq/L (ref 96–112)
Creatinine, Ser: 0.54 mg/dL (ref 0.4–1.2)
GFR calc Af Amer: >60 mL/min (ref >60)
Total Protein: 6.3 g/dL (ref 6.0–8.3)

## 2010-10-14 LAB — URINE MICROSCOPIC-ADD ON

## 2010-11-10 LAB — BASIC METABOLIC PANEL
BUN: 3 mg/dL — ABNORMAL LOW (ref 6–23)
BUN: 7 mg/dL (ref 6–23)
BUN: 9 mg/dL (ref 6–23)
CO2: 29 mEq/L (ref 19–32)
Calcium: 8.7 mg/dL (ref 8.4–10.5)
Calcium: 8.8 mg/dL (ref 8.4–10.5)
Chloride: 103 mEq/L (ref 96–112)
Creatinine, Ser: 0.61 mg/dL (ref 0.4–1.2)
Creatinine, Ser: 0.72 mg/dL (ref 0.4–1.2)
Creatinine, Ser: 0.73 mg/dL (ref 0.4–1.2)
GFR calc Af Amer: >60 mL/min (ref >60)
GFR calc non Af Amer: >60 mL/min (ref >60)
GFR calc non Af Amer: >60 mL/min (ref >60)
Glucose, Bld: 100 mg/dL — ABNORMAL HIGH (ref 70–99)
Glucose, Bld: 101 mg/dL — ABNORMAL HIGH (ref 70–99)
Glucose, Bld: 125 mg/dL — ABNORMAL HIGH (ref 70–99)
Potassium: 3.7 mEq/L (ref 3.5–5.1)

## 2010-11-10 LAB — CBC
HCT: 31.5 % — ABNORMAL LOW (ref 36.0–46.0)
MCHC: 34.2 g/dL (ref 30.0–36.0)
MCV: 86.2 fL (ref 78.0–100.0)
MCV: 87 fL (ref 78.0–100.0)
Platelets: 197 10*3/uL (ref 150–400)
Platelets: 216 10*3/uL (ref 150–400)
Platelets: 233 10*3/uL (ref 150–400)
RBC: 3.77 MIL/uL — ABNORMAL LOW (ref 3.87–5.11)
RDW: 12.7 % (ref 11.5–15.5)
RDW: 13.2 % (ref 11.5–15.5)
RDW: 13.3 % (ref 11.5–15.5)
WBC: 5.9 10*3/uL (ref 4.0–10.5)
WBC: 7.1 10*3/uL (ref 4.0–10.5)

## 2010-11-10 LAB — DIFFERENTIAL
Basophils Absolute: 0 10*3/uL (ref 0.0–0.1)
Eosinophils Absolute: 0.3 10*3/uL (ref 0.0–0.7)
Eosinophils Relative: 4 % (ref 0–5)
Lymphocytes Relative: 28 % (ref 12–46)
Lymphs Abs: 2 10*3/uL (ref 0.7–4.0)
Neutrophils Relative %: 60 % (ref 43–77)

## 2010-11-10 LAB — URINALYSIS, ROUTINE W REFLEX MICROSCOPIC
Nitrite: NEGATIVE
Specific Gravity, Urine: 1.02 (ref 1.005–1.030)
Urobilinogen, UA: 0.2 mg/dL (ref 0.0–1.0)
pH: 5.5 (ref 5.0–8.0)

## 2010-11-10 LAB — URINE MICROSCOPIC-ADD ON

## 2010-11-10 LAB — TYPE AND SCREEN
ABO/RH(D): O POS
Antibody Screen: NEGATIVE

## 2010-11-10 LAB — PHOSPHORUS: Phosphorus: 4.7 mg/dL — ABNORMAL HIGH (ref 2.3–4.6)

## 2010-11-10 LAB — PREGNANCY, URINE: Preg Test, Ur: NEGATIVE

## 2010-11-10 LAB — MAGNESIUM: Magnesium: 1.9 mg/dL (ref 1.5–2.5)

## 2010-11-27 ENCOUNTER — Telehealth: Payer: Self-pay | Admitting: Gastroenterology

## 2010-11-27 NOTE — Telephone Encounter (Signed)
Pt has a form that needs to be filled out for her insurance company. Instructed pt to bring it to the office and drop it off to be filled out. Pt knows it takes a while for these to be done downstairs.

## 2010-11-27 NOTE — Telephone Encounter (Signed)
Left message for pt to call back  °

## 2010-12-15 NOTE — H&P (Signed)
**Note Christine via Obfuscation** [name], [name] NO.:  [id number]   MEDICAL RECORD NO.:  [id number]          PATIENT TYPE:  INP   LOCATION:  5152                         FACILITY:  [facility]   PHYSICIAN:  Gordy Savers, MDDATE OF BIRTH:  Nov 15, 1970   DATE OF ADMISSION:  04/13/2008  DATE OF DISCHARGE:                              HISTORY & PHYSICAL   CHIEF COMPLAINT:  Abdominal pain.   HISTORY OF PRESENT ILLNESS:  The patient is a 40 year old white female  with remote history of peptic ulcer disease.  For the past week, she has  had some upper abdominal dyspepsia with discomfort, bloating, and some  burning discomfort.  She has had some occasional diarrhea over the past  week.  One day prior to admission, she had the onset of lower abdominal  pain most marked in the left lower quadrant.  Due to worsening pain, she  was seen at the [facility] where evaluation  included a CT scan of the pelvis.  This revealed changes consistent with  acute sigmoid diverticulitis.  There were also a few foci of  extraluminal air suggesting some localized micro perforations, but no  abscess formation.  A small amount of free fluid noted.  Uterus and  appendix and ovaries were all normal.  The patient is now admitted for  further evaluation and treatment of suspected acute sigmoid  diverticulitis.   The patient has had a history of prior colonoscopy a number of years  ago.  No prior history of diverticulosis.  No diverticular lesions were  noted on the pelvic CT scan.  Associated symptoms include anorexia and  some nausea.   PAST MEDICAL HISTORY:  The patient has a history of seasonal allergic  rhinitis.  Other problems include history of hypertension, remote peptic  ulcer disease, and also a history of gastroesophageal reflux disease.  She has history of migraine headaches.  She is a gravida 4, para 2,  abortus 2.  She had a miscarriage in 2007 and underwent laparotomy in  1994 for  an ectopic pregnancy.   ALLERGIES:  PENICILLIN and HYDROCODONE.   MEDICAL REGIMEN:  Xyzal.   FAMILY HISTORY:  Positive for alcoholism, osteoarthritis, coronary  artery disease, and cerebrovascular disease.  Also hypertension.   SOCIAL HISTORY:  She is married, nonsmoker, social drinker, and has 2  daughters.   PHYSICAL EXAMINATION:  GENERAL:  A well-developed, healthy-appearing  female comfortable at rest.  SKIN:  Warm and dry without rash.  HEENT:  Head and neck revealed normal pupil responses.  Conjunctiva  clear.  Anicteric.  Ear, nose, and throat normal.  NECK:  No adenopathy or bruits.  CHEST:  Clear.  CARDIOVASCULAR:  Revealed normal S1-S2.  No murmurs appreciated.  ABDOMEN:  Revealed diminished breath sounds.  There is mild generalized  tenderness, most marked in the left lower quadrant.  No guarding or  rebound tenderness noted.  EXTREMITIES:  Negative.  Peripheral pulses  full.   LABORATORY STUDIES:  Included a CBC that revealed a white count of 9.2.  Pelvic CT revealed findings consistent with sigmoid diverticulitis.   IMPRESSION:  Probable  acute sigmoid diverticulitis.   DISPOSITION:  The patient will be admitted to hospital for parenteral  antibiotic therapy pain control.      Gordy Savers, MD  Electronically Signed     PFK/MEDQ  D:  04/13/2008  T:  04/14/2008  Job:  [id number]

## 2010-12-15 NOTE — Op Note (Signed)
NAMECATERIN, Christine [name]            ACCOUNT NO.:  [id number]   MEDICAL RECORD NO.:  [id number]          PATIENT TYPE:  INP   LOCATION:  0005                         FACILITY:  [facility]   PHYSICIAN:  Almond Lint, MD       DATE OF BIRTH:  26-Jun-1971   DATE OF PROCEDURE:  12/26/2008  DATE OF DISCHARGE:                               OPERATIVE REPORT   PREOPERATIVE DIAGNOSIS:  Diverticulitis.   POSTOPERATIVE DIAGNOSIS:  Diverticulitis.   PROCEDURE PERFORMED:  Hand-assisted laparoscopic sigmoidectomy with  splenic flexure takedown.   ANESTHESIA:  General and local.   FINDINGS:  Inflamed sigmoid.   SPECIMENS:  Sigmoid to pathology.   ESTIMATED BLOOD LOSS:  150 mL.   COMPLICATIONS:  None known.   PROCEDURE:  Christine [name] was identified in the holding area and taken to  the operating room where she was placed supine on operating room table.  General endotracheal anesthesia was induced.  The patient was then  placed into lithotomy position.  The abdomen and perineum were prepped  and draped in a sterile fashion.  Time out was performed according to  the surgical safety check list.  When all was correct, we continued.   The skin in the left upper quadrant was anesthetized with 1% lidocaine  plain and 0.25% Marcaine with epinephrine.  An incision was made with a  #11 blade.  The OptiView 5-mm port was advanced through the abdominal  wall under direct visualization.  Pneumoperitoneum was then achieved to  a pressure of 15 mmHg.  The abdomen was examined, and there were no  significant adhesions seen within the abdomen other than the sigmoid to  the lateral abdominal wall.  A 10-mm port was placed through her old  laparoscopic incision just inferior to the umbilicus.  An port was  placed in the suprapubic area as well as in the right lower quadrant.  Additionally, another port was placed in the left abdomen.  The sigmoid  was identified and retracted with a Glassman.  Sharp dissection was  used  to take the sigmoid from the lateral abdominal wall.  Once this was  mobilized, attention was then directed to the retroperitoneum.   The ureter was identified as it had been tethered up slightly laterally  to the abdominal wall as well as to her fallopian tube.  This was  dissected free from the mesocolon, taking care not to skeletonized it.  Also, the ovarian vein was seen, and this also was taken down off of the  mesocolon.  The white line of Toldt was taken down along the descending  colon, and the descending colon was reflected medially.  The ureter was  seen coursing all the way up the psoas muscle.  The splenic flexure was  then taken down by retracting the omentum toward the head, placing the  patient in left side down and reverse Trendelenburg position.  Harmonic  scalpel was used to take the omentum off of the splenic flexure.  The  splenic flexure was then taken down from the retroperitoneal attachments  with the harmonic as well.  Once it was seen that  there was adequate  mobilization of splenic flexure, attention was then directed back to the  pelvis.  The mesocolon on both sides of the distal sigmoid was opened in  order to identify an appropriate location for transection of the distal  sigmoid/proximal rectum.  A window was made in the mesocolon and the  Kingsley Spittle passed all the way underneath the colon.  This was retracted  toward the abdominal wall.  The remaining attachments to the distal  colon were taken with the harmonic.  Splenic flexure was then again  retracted toward the pelvis to make sure there was adequate  mobilization.  It appeared that there was.  The hand port was placed in  the midline order to assist with the mobilization of the sigmoid in the  pelvis.  Also, this was this was helpful in identifying the part of the  sigmoid to transect.  Once this was accomplished, the pneumoperitoneum  was evacuated.   The colon was transected at the distal  sigmoid with the Contour stapler.  The colon was then transected with the GIA at the distal descending  colon.  Kelly clamps and 2-0 ties were used to take the IMA and the  remaining vessels to the specimen to be removed.  The distal portion of  the colon would not then reach to the pelvis, and so the  pneumoperitoneum was reachieved with the hand port, and the IMV was  taken with clips and with the harmonic in order to more fully advance  the colon to the pelvis.  With this dissection, the distal end of the  sigmoid became ischemic.  Once it was assured that the mobilization was  achieved, the pneumoperitoneum was very evacuated, and approximately 4  inches of colon were transected with good bleeding and good blood  supply.  This part of colon was very pink.   The sizers were used to determine that 33 EEA would fit easily within  the colon.  The anvil was sewn into place with a 0 Prolene pursestring  suture.  The epiploic appendices were cleaned off of the end of the  colon in order to facilitate a good anastomosis.  The other portion of  the 73 EEA was placed through the rectum taking care to ensure that this  was not in the vagina.  The spike was advanced through the rectal wall  and the anvil attached into place.  This was done under direct  visualization taking care not to incorporate any other fat or abdominal  contents in the anastomosis.  The stapler was closed down and then  stapled with pressure held for 30 seconds.  The stapler was then opened  partially and removed through the rectum.  The donuts were evaluated,  and these were intact x2.  Proctoscope was advanced through the rectum,  and some gas was used to inflate the rectum under irrigation.  There was  no bubbling seen in the pelvis.  The area was then irrigated copiously.   The laparoscopic equipment was placed back in the abdomen, and there was  no bleeding seen in the abdomen.  There was also no injuries to any   other organs seen.  The pneumoperitoneum was then evacuated.  The  umbilical incision was closed with a 0 Vicryl UR6 on the fascia, taking  care to make sure there was no additional fascial defects seen.  The  fascia of the hand port was closed with 2-0 looped PDS sutures.  The  skin  of all the incisions was closed using staples.  The abdominal wall  was cleaned, dried and dressed with Band-Aids on the small port  incisions and with an island dressing on the hand port incision.  The  patient was awakened from anesthesia and taken to the PACU in stable  condition.      Almond Lint, MD  Electronically Signed     FB/MEDQ  D:  12/26/2008  T:  12/26/2008  Job:  [id number]

## 2010-12-15 NOTE — Discharge Summary (Signed)
[name], [name]            ACCOUNT NO.:  [id number]   MEDICAL RECORD NO.:  [id number]          PATIENT TYPE:  INP   LOCATION:  5152                         FACILITY:  [facility]   PHYSICIAN:  Valerie A. Felicity Coyer, MDDATE OF BIRTH:  [birth year]   DATE OF ADMISSION:  04/13/2008  DATE OF DISCHARGE:  04/17/2008                               DISCHARGE SUMMARY   PRIMARY CARE PHYSICIAN:  Barbette Hair. Artist Pais, DO   DISCHARGE DIAGNOSES:  1. Abdominal pain in setting of sigmoid diverticulitis with small      perforation.  2. Mild anemia.  3. Hypertension history, currently stable without antihypertensives.  4. Remote history of peptic ulcer disease.  5. History of migraine headaches.   HISTORY OF PRESENT ILLNESS:  Christine [name] is a 40 year old female who was  admitted on April 13, 2008, with chief complaint of abdominal pain.  For the week prior to this admission she had some upper abdominal  dyspepsia with discomfort and bloating.  Felt some burning discomfort.  She also had some occasional diarrhea over the past week.  On the day  prior of her admission, she had the onset of lower abdominal pain, was  marked in the left lower quadrant.  Due to worsening of pain, she was  sent to [facility], where evaluation included CT  scan of the pelvis.  This revealed changes consistent with the acute  sigmoid diverticulitis.  She was noted to have a small amount of free  fluid.  She was admitted for further evaluation and treatment.   PAST MEDICAL HISTORY:  1. Seasonal allergic rhinitis.  2. Hypertension.  3. Peptic ulcer disease.  4. GERD.  5. Migraine headaches.   COURSE OF HOSPITALIZATION:  Sigmoid diverticulitis with small  perforation.  The patient was admitted and underwent a CT of the abdomen  and pelvis.  Sigmoid diverticulitis was seen with some  extra luminal  air, which suggested localized perforation.  The patient was initially  n.p.o.  She is currently tolerating  p.o.'s without difficulty.  She is  maintained on antibiotics for which she had been transitioned today to  oral antibiotics.  Her white count is normal, and she has been afebrile.  She reports no significant abdominal tenderness.  At this time, we plan  to continue proton pump inhibitor due to peptic ulcer disease and to  continue oral Flagyl and Cipro to complete a total of 14-day course.   MEDICATIONS AT THE TIME OF DISCHARGE:  1. Cipro 500 mg p.o. b.i.d. x9 days.  2. Flagyl 500 mg p.o. t.i.d. x9 days.  3. Xyzal 5 mg p.o. daily.   PERTINENT LABORATORY DATA:  At the time of discharge, BUN 4, creatinine  0.68.  Sodium 138, potassium 4.0, hemoglobin 12, hematocrit 35.4, white  blood cell count 6.3, and platelets 217.   FOLLOWUP:  She is to follow up with her primary care Brindy Higginbotham in 1-2  weeks.  She is also instructed to call MD should she develop fever  greater than 101 or worsening abdominal tenderness/pain.   Greater than 30 minutes were spent on discharge planning.  Sandford Craze, NP      Raenette Rover. Felicity Coyer, MD  Electronically Signed    MO/MEDQ  D:  04/17/2008  T:  04/18/2008  Job:  [id number]   cc:   Barbette Hair. Artist Pais, DO

## 2010-12-15 NOTE — Discharge Summary (Signed)
[name], [name]            ACCOUNT NO.:  [id number]   MEDICAL RECORD NO.:  [id number]          PATIENT TYPE:  INP   LOCATION:  5152                         FACILITY:  [facility]   PHYSICIAN:  Valerie A. Felicity [name], MDDATE OF BIRTH:  [birth year]   DATE OF ADMISSION:  04/13/2008  DATE OF DISCHARGE:  04/17/2008                               DISCHARGE SUMMARY   ADDENDUM   The patient also requested some p.r.n. Zofran for home as she felt  somewhat nauseated from the oral Cipro and Flagyl.  We have called in a  prescription to the [facility] in [place] on [address] for  Zofran 4 mg p.o. q.8 h. p.r.n., dispense 30 tablets.      Christine Craze, NP      Christine Rover. Felicity Coyer, MD  Electronically Signed    MO/MEDQ  D:  04/17/2008  T:  04/18/2008  Job:  [id number] [id number]

## 2010-12-18 NOTE — Discharge Summary (Signed)
[name], [name]            ACCOUNT NO.:  [id number]   MEDICAL RECORD NO.:  [id number]          PATIENT TYPE:  INP   LOCATION:  1528                         [facility]ACILITY:  [facility]   PHYSICIAN:  Almond Lint, MD       DATE OF BIRTH:  1970/11/02   DATE OF ADMISSION:  12/26/2008  DATE OF DISCHARGE:  01/01/2009                               DISCHARGE SUMMARY   FINAL DIAGNOSIS:  Diverticulitis, recurrent.   DISCHARGE MEDICATIONS:  1. Xyzal 5 mg once a day.  2. Excedrin Migraine p.r.n.  3. Percocet 5/325 one to two tabs p.o. q.4 hours p.r.n. pain.   DISCHARGE INSTRUCTIONS:  No heavy lifting for 3 to 6 weeks.  May shower.  No swimming or bathing for 2 weeks.  Keep wounds clean and dry.  Ambulate.   HOSPITAL COURSE:  [name] is a 40 year old female who was admitted  to the hospital following a hand-assisted laparoscopic sigmoid  colectomy.  She had some nausea over night but the pain was controlled  well with PCA.  She was on the  Entereg protocol.  Her foley was  discontinued and she was able to void spontaneously.  Over the weekend,  her diet was advanced.  She was given some doses of Toradol.  She began  developing some flatus and required Imitrex for headache. She was  discharged to home on postop day 6 in good condition.      Almond Lint, MD  Electronically Signed     FB/MEDQ  D:  01/15/2009  T:  01/15/2009  Job:  [id number]

## 2010-12-18 NOTE — Op Note (Signed)
[name], Christine [name]            ACCOUNT NO.:  [id number]   MEDICAL RECORD NO.:  [id number]          PATIENT TYPE:  AMB   LOCATION:  SDC                           FACILITY:  [facility]   PHYSICIAN:  Guy Sandifer. Henderson Cloud, M.D. DATE OF BIRTH:  05-08-1971   DATE OF PROCEDURE:  10/16/2005  DATE OF DISCHARGE:                                 OPERATIVE REPORT   PREOPERATIVE DIAGNOSES:  Missed abortion.   POSTOPERATIVE DIAGNOSIS:  Missed abortion.   OPERATION/PROCEDURE:  1.  Dilation and evacuation.  2.  1% Xylocaine paracervical block.   SURGEON:  Guy Sandifer. Henderson Cloud, M.D.   ANESTHESIA:  MAC.   SPECIMENS:  Products of conception.   ESTIMATED BLOOD LOSS:  Less than 50 mL.   INDICATIONS AND CONSENT:  This patient is s 40 year old married white  female, gravida 4, para 2, who has missed abortion.  Details are dictated in  the history and physical.  After discussing options of management, dilation  and evacuation was discussed.  Potential risks and complications were  discussed preoperatively including but not limited infection, uterine  perforation, organ damage, bleeding requiring transfusion of blood products,  possible HIV and hepatitis acquisition, DVT, PE, pneumonia, intrauterine  synechia and  secondary infertility, and hysterectomy.  All questions have  been answered and consent is signed on the chart.   DESCRIPTION OF PROCEDURE:  The patient was taken to the operating room where  she is identified and placed in the dorsal lithotomy position and given  intravenous sedation.  She has been placed in the dorsal lithotomy position  where she is prepped, bladder straight catheterized and she is draped in the  sterile fashion.  Examination reveals the uterus to be six to eight weeks in  size.  Bivalve speculum was placed and the anterior cervical lip was  injected with 1% Xylocaine and grasped with a single-tooth tenaculum.  Paracervical block had been placed at 2, 4, 5, 7, and 10 o'clock  positions  with approximately 20 mL total 1% plain Xylocaine.  Cervix is gently,  progressively dilated to a 31 dilator.  A #7 curved curet was is then placed  in the intrauterine cavity.  Suction curettage is carried out for obvious  products of conception.  Twenty units of Pitocin are added to the 1 L of IV  fluids after the initial pass of the suction curet.  Alternating suction and  sharp curettage is carried out until the cavity is clean.  Good  hemostasis is noted.  Instruments are removed and no bleeding is noted.  Blood type is O positive.  All counts correct.  The patient is taken to the  recovery room in stable condition.   The patient will be discharged home on Methergine 0.2 mg t.i.d. for six  doses.      Guy Sandifer Henderson Cloud, M.D.  Electronically Signed     JET/MEDQ  D:  10/16/2005  T:  10/18/2005  Job:  [id number]

## 2010-12-18 NOTE — H&P (Signed)
Christine [name], Christine [name]            ACCOUNT NO.:  [id number]   MEDICAL RECORD NO.:  [id number]          PATIENT TYPE:  AMB   LOCATION:  SDC                           FACILITY:  [facility]   PHYSICIAN:  Guy Sandifer. Henderson Cloud, M.D. DATE OF BIRTH:  [birth year]   DATE OF ADMISSION:  DATE OF DISCHARGE:                                HISTORY & PHYSICAL   DATE OF ADMISSION:  October 16, 2005.   CHIEF COMPLAINT:  Miscarriage.   HISTORY OF PRESENT ILLNESS:  This patient is a 40 year old married white  female G4, P2 who has been followed for a slowly growing intrauterine  pregnancy with low heart rate.  On October 15, 2005 ultrasound reveals an  intrauterine pregnancy with a crown-rump length of 6 weeks, 5 days, no fetal  heart beat is noted on prolonged examination.  After discussion of options  and management with the patient she is presenting for dilatation and  evacuation.  The potential risks and complications have been discussed  preoperatively.   PAST MEDICAL HISTORY:  1.  Pregnancy-induced hypertension.  2.  Cervical dysplasia.   PAST SURGICAL HISTORY:  Laparoscopic salpingostomy for ectopic pregnancy in  1994.   OBSTETRICAL HISTORY:  1.  Ectopic.  2.  Vaginal delivery.  3.  Miscarriage.  4.  Miscarriage.   FAMILY HISTORY:  Heart disease maternal grandfather, paternal grandfather,  rheumatic fever grandfather.  Chronic hypertension father and grandparents.  Diabetes grandfather and aunt, Grave's disease grandmother.   MEDICATIONS:  Prenatal vitamins.   ALLERGIES:  PENICILLIN, CODEINE, SULFA.   SOCIAL HISTORY:  Denies tobacco, alcohol or drug abuse.   REVIEW OF SYSTEMS:  NEUROLOGICAL:  Denies headache.  CORONARY:  Denies chest  pain.  PULMONARY:  Denies shortness of breath.  GASTROINTESTINAL:  Denies  recent changes in bowel habits.   PHYSICAL EXAMINATION:  VITAL SIGNS:  Height 5 feet 9 inches, weight  __________ pounds.  Blood pressure 110/74.  HEENT:  Without thyromegaly.  LUNGS:   Clear to auscultation.  HEART:  Regular rate and rhythm.  BACK:  Without costovertebral angle tenderness.  BREASTS:  Not examined.  ABDOMEN:  Soft, nontender without masses.  PELVIC:  Cervix closed, thick and high, 6 to 8 weeks in size.  Adnexa  nontender without masses.  EXTREMITIES:  Grossly within normal limits.  NEUROLOGICAL:  Grossly within normal limits.   LABORATORY DATA:  Blood type is O positive.   ASSESSMENT:  Spontaneous abortion.   PLAN:  Dilatation and evacuation.      Guy Sandifer Henderson Cloud, M.D.  Electronically Signed     JET/MEDQ  D:  10/15/2005  T:  10/15/2005  Job:  [id number]

## 2010-12-18 NOTE — H&P (Signed)
Christine [name], Christine [name]            ACCOUNT NO.:  [id number]   MEDICAL RECORD NO.:  [id number]          PATIENT TYPE:  INP   LOCATION:  1528                         FACILITY[facility]   PHYSICIAN:  Almond Lint, MD       DATE OF BIRTH:  29-Jul-1971   DATE OF ADMISSION:  12/26/2008  DATE OF DISCHARGE:  01/01/2009                              HISTORY & PHYSICAL   CHIEF COMPLAINT:  Abdominal pain.   HISTORY OF PRESENT ILLNESS:  Ms. Christine [name] is a very pleasant 40 year old  female with recurrent diverticulitis.  She has had multiple episodes of  outpatient treatment with oral antibiotics.  She continues to have  recurrence of her pain when she stops her antibiotics.  She presents for  elective resection.  She denies weight loss, fevers or chills currently.  She also has not had any change in her bowel habits recently.   PAST MEDICAL HISTORY:  Significant for allergies and migraine headaches.   SOCIAL HISTORY:  She is accompanied by her husband.  She does not drink  alcohol.   REVIEW OF SYSTEMS:  Otherwise negative x11 systems.   PHYSICAL EXAMINATION:  GENERAL:  She is alert and oriented x3 in no  acute distress.  Mood and affect are normal.  VITAL SIGNS:  Stable.  Afebrile.  HEART:  Regular.  LUNGS:  Clear.  ABDOMEN:  Soft, nontender, nondistended.  EXTREMITIES:  Warm and well perfused.   ASSESSMENT:  Ms. Christine [name] is a 40 year old female with recurrent  diverticulitis presenting for elective resection.   PLAN:  Operating room and postoperative pain control.      Almond Lint, MD  Electronically Signed     FB/MEDQ  D:  01/15/2009  T:  01/15/2009  Job:  [id number]

## 2010-12-25 ENCOUNTER — Ambulatory Visit (INDEPENDENT_AMBULATORY_CARE_PROVIDER_SITE_OTHER): Payer: 59 | Admitting: Family Medicine

## 2010-12-25 ENCOUNTER — Encounter: Payer: Self-pay | Admitting: Family Medicine

## 2010-12-25 ENCOUNTER — Encounter: Payer: Self-pay | Admitting: Internal Medicine

## 2010-12-25 ENCOUNTER — Telehealth: Payer: Self-pay | Admitting: Internal Medicine

## 2010-12-25 DIAGNOSIS — J209 Acute bronchitis, unspecified: Secondary | ICD-10-CM

## 2010-12-25 DIAGNOSIS — R062 Wheezing: Secondary | ICD-10-CM

## 2010-12-25 DIAGNOSIS — J309 Allergic rhinitis, unspecified: Secondary | ICD-10-CM

## 2010-12-25 MED ORDER — CROMOLYN SODIUM 5.2 MG/ACT NA AERS: 1.0000 | INHALATION_SPRAY | Freq: Four times a day (QID) | NASAL | Status: DC

## 2010-12-25 NOTE — Progress Notes (Signed)
OFFICE NOTE  12/25/2010  CC:  Chief Complaint  Patient presents with  . Wheezing     HPI:   Patient is a 40 y.o. Caucasian female who is here for wheezing. Reports gradual worsening of her seasonal allergic rhinitis symptoms the last few weeks---sneezing, nasal congest/drainage, head fullness, eyes itchy/watery, and then about 2d ago developed cough, chest tightness, and intermittent wheezing.  No fever.  No ST.  No SOB. No n/v/d or rash.  No abd pain or musculoskeletal complaints. She is breastfeeding her 49mo old son and is limited as far as allergy meds she can take safely.  Pertinent PMH:  Asthmatic bronchitis ---1st episode.  When pregnant in 2011.  Improved significantly with advair short term.   Seasonal allergic rhinitis GERD  MEDS;   No outpatient prescriptions prior to visit.  Prenatal vitamin. Zyrtec 10mg  qd  PE: Blood pressure 119/81, pulse 90, temperature 98.3 F (36.8 C), temperature source Oral, height 5\' 9"  (1.753 m), weight 197 lb (89.359 kg), SpO2 98.00%. VS: noted--normal. Gen: alert, NAD, NONTOXIC APPEARING. HEENT: eyes without injection, drainage, or swelling.  Ears: EACs clear, TMs with normal light reflex and landmarks.  Nose: Clear rhinorrhea, with some dried, crusty exudate adherent to mildly injected mucosa.  No purulent d/c.  No paranasal sinus TTP.  No facial swelling.  Throat and mouth without focal lesion.  No pharyngial swelling, erythema, or exudate.   Neck: supple, no LAD.   LUNGS: CTA bilat, nonlabored resps.  With forced expiration maneuver she does have mild coarse end-exp wheezes diffusely, with mild prolongation of exp phase.  No resp distress.  Airflow is good. CV: RRR, no m/r/g. EXT: no c/c/e SKIN: no rash    IMPRESSION AND PLAN:  ACUTE BRONCHITIS Recommended restart of advair 250/50, 1 puff bid--sample given for 1 wk supply.  Then take 100/50 dose for 1 week. At that point she needs to f/u with PCP to discuss possible trial of daily  asthma preventative med. Proventil HFA sample given today and I taught her how to use it: 2 puffs q4h prn.  ALLERGIC RHINITIS Difficult to control for her b/c of her breastfeeding status.  The only nasal allergy med okay in lactation is cromolyn, so I did rx this for her to try. Also, all of the nonsedating antihistamines that are available OTC are okay in lactating women, so she'll try different ones in the future.     FOLLOW UP:  Return in about 2 weeks (around 01/08/2011) for asthma rechecke3.

## 2010-12-25 NOTE — Telephone Encounter (Signed)
Call returned to paitent at [phone].  She state she has been aggravated by seasonal allergies. She states she is currently nursing. She has taken, zyrtec with no relief. She has had her symptoms  for the last 7-10 days of cough-yellow green, wheezing, chest tightness, back pain, head congestion, and wheezing.  After speaking with Sandford Craze she was advised that she will need evaluation. She was offered an appointment with [facility] office and has accepted. She was sent to appointments to schedule.

## 2010-12-25 NOTE — Assessment & Plan Note (Signed)
Recommended restart of advair 250/50, 1 puff bid--sample given for 1 wk supply.  Then take 100/50 dose for 1 week. At that point she needs to f/u with PCP to discuss possible trial of daily asthma preventative med. Proventil HFA sample given today and I taught her how to use it: 2 puffs q4h prn.

## 2010-12-25 NOTE — Telephone Encounter (Signed)
Pt states that she see's dr. Artist Pais usually twice a year for allergy related problems. She states that she is having seasonal allergies problems that consist of wheezing,coughing, and chest congestion. Pt wants to know if it would be all right just to use the inhaler that was given to her or if she needs to come in for visit?

## 2010-12-25 NOTE — Patient Instructions (Signed)
Take proventil inhaler 2 puffs every 4 hours as needed for chest tightness, wheezing, or shortness of breath. Take advair 1 puff twice daily until you run out.

## 2010-12-25 NOTE — Assessment & Plan Note (Signed)
Difficult to control for her b/c of her breastfeeding status.  The only nasal allergy med okay in lactation is cromolyn, so I did rx this for her to try. Also, all of the nonsedating antihistamines that are available OTC are okay in lactating women, so she'll try different ones in the future.

## 2011-05-03 LAB — BASIC METABOLIC PANEL
Calcium: 8.9
GFR calc non Af Amer: >60
Glucose, Bld: 102 — ABNORMAL HIGH
Potassium: 4
Sodium: 138

## 2011-05-03 LAB — CBC
Hemoglobin: 12
Platelets: 217
RDW: 12.6
WBC: 6.3

## 2011-05-05 LAB — COMPREHENSIVE METABOLIC PANEL
AST: 22
BUN: 9
CO2: 28
Glucose, Bld: 106 — ABNORMAL HIGH
Potassium: 4.1
Sodium: 141
Total Bilirubin: 0.7

## 2011-05-05 LAB — GC/CHLAMYDIA PROBE AMP, GENITAL: Chlamydia, DNA Probe: NEGATIVE

## 2011-05-05 LAB — CBC
HCT: 35.1 — ABNORMAL LOW
HCT: 41.7
Hemoglobin: 11.9 — ABNORMAL LOW
Hemoglobin: 14.5
MCHC: 34.8
MCV: 87.5
Platelets: 211
RBC: 4.01
RBC: 4.92
WBC: 8.2

## 2011-05-05 LAB — DIFFERENTIAL
Basophils Absolute: 0
Lymphocytes Relative: 12
Monocytes Absolute: 0.6
Monocytes Relative: 7
Neutro Abs: 7.4
Neutrophils Relative %: 79 — ABNORMAL HIGH

## 2011-05-05 LAB — WET PREP, GENITAL
Clue Cells Wet Prep HPF POC: NONE SEEN
Trich, Wet Prep: NONE SEEN
Yeast Wet Prep HPF POC: NONE SEEN

## 2011-05-05 LAB — URINALYSIS, ROUTINE W REFLEX MICROSCOPIC
Glucose, UA: NEGATIVE
Hgb urine dipstick: NEGATIVE
Protein, ur: NEGATIVE
pH: 5.5

## 2011-05-28 ENCOUNTER — Ambulatory Visit (INDEPENDENT_AMBULATORY_CARE_PROVIDER_SITE_OTHER): Payer: 59 | Admitting: Internal Medicine

## 2011-05-28 ENCOUNTER — Other Ambulatory Visit: Payer: Self-pay | Admitting: Internal Medicine

## 2011-05-28 ENCOUNTER — Encounter: Payer: Self-pay | Admitting: Internal Medicine

## 2011-05-28 ENCOUNTER — Ambulatory Visit (HOSPITAL_BASED_OUTPATIENT_CLINIC_OR_DEPARTMENT_OTHER)
Admission: RE | Admit: 2011-05-28 | Discharge: 2011-05-28 | Disposition: A | Discharge status: Home / Self Care | Payer: 59 | Source: Ambulatory Visit | Attending: Internal Medicine | Admitting: Internal Medicine

## 2011-05-28 VITALS — BP 100/70 | HR 71 | Temp 98.1°F | Resp 18 | Ht 69.0 in | Wt 198.0 lb

## 2011-05-28 DIAGNOSIS — E041 Nontoxic single thyroid nodule: Secondary | ICD-10-CM

## 2011-05-28 DIAGNOSIS — E049 Nontoxic goiter, unspecified: Secondary | ICD-10-CM | POA: Insufficient documentation

## 2011-05-28 DIAGNOSIS — Z23 Encounter for immunization: Secondary | ICD-10-CM

## 2011-05-28 DIAGNOSIS — M7020 Olecranon bursitis, unspecified elbow: Secondary | ICD-10-CM

## 2011-05-30 ENCOUNTER — Encounter: Payer: Self-pay | Admitting: Internal Medicine

## 2011-05-30 DIAGNOSIS — E041 Nontoxic single thyroid nodule: Secondary | ICD-10-CM | POA: Insufficient documentation

## 2011-05-30 DIAGNOSIS — M7020 Olecranon bursitis, unspecified elbow: Secondary | ICD-10-CM | POA: Insufficient documentation

## 2011-05-30 NOTE — Assessment & Plan Note (Signed)
Obtain tsh/ft4 and schedule thyroid US.

## 2011-05-30 NOTE — Assessment & Plan Note (Signed)
Attempt short course of ibuprofen otc prn with food and no other nsaids

## 2011-05-30 NOTE — Progress Notes (Signed)
  Subjective:    Patient ID: Christine [name], female    DOB: 1971-04-09, 40 y.o.   MRN: [id number]  HPI Pt presents to clinic for evaluation of possible neck mass. Notes small ST prominence right side of neck near thryoid. Area is nontender and not increasing in size. May have occurred after URI. +family hx of thyroid problems. Also notes right lateral elbow pain worse with pronation. No injury or trauma but has been lifting her son with that arm. Currently breast feeding. No other alleviating or exacerbating factors. No other complaints.  Past Medical History  Diagnosis Date  . Migraine, unspecified, without mention of intractable migraine without mention of status migrainosus   . Miscarriage 2007  . Diverticulitis     recurrent  . Allergy   . Asthma     Acute asthmatic bronchitis 2011 while pregnant   Past Surgical History  Procedure Date  . Laparoscopy 5/94    x 2  . Dilation and curettage of uterus     miscarriage  . Sigmoidectomy     Hand-assisted laparoscopic sigmoidectomy with splenic flexure takedown.    reports that she has quit smoking. She has never used smokeless tobacco. She reports that she drinks alcohol. She reports that she does not use illicit drugs. family history includes Alcohol abuse in her other; Arthritis in her other; Barrett's esophagus in her mother; Clotting disorder in her other; Colon polyps in her father; Coronary artery disease in her other; Crohn's disease in her cousin; Diabetes in her father, maternal grandfather, and mother; Hyperlipidemia in her other; Hypertension in her other; and Stroke in her other.  There is no history of Colon cancer. Allergies  Allergen Reactions  . Hydrocodone   . Penicillins        Review of Systems see hpi     Objective:   Physical Exam  Nursing note and vitals reviewed. Constitutional: She appears well-developed and well-nourished. No distress.  HENT:  Head: Normocephalic and atraumatic.  Right Ear: External  ear normal.  Left Ear: External ear normal.  Eyes: Conjunctivae are normal.  Neck: Neck supple. Thyromegaly present.       Mild right thyroid soft tissue prominence. NT.  Musculoskeletal:       Right elbow: no erythema, warmth or effusion. + tenderness right lateral epicondyle. FROM.  Neurological: She is alert.  Skin: Skin is warm. She is not diaphoretic.  Psychiatric: She has a normal mood and affect.          Assessment & Plan:

## 2011-05-31 ENCOUNTER — Telehealth: Payer: Self-pay | Admitting: *Deleted

## 2011-05-31 NOTE — Telephone Encounter (Signed)
Patient called and left voice message requesting lab results from Friday.   Tsh was entered as clinic collect instead of lab collect. Test shows that blood need to be collected. Spoke with Jennette Kettle with [employer] test has been added.  He stated results of Free T4 were 0.91.

## 2011-06-02 NOTE — Telephone Encounter (Signed)
Patient advised of lab results. No additional questions at this time. She will follow up with Dr Rodena Medin as instructed.

## 2011-06-05 LAB — ANTI-MICROSOMAL ANTIBODY LIVER / KIDNEY: LKM1 Ab: <=20 U

## 2011-06-10 ENCOUNTER — Ambulatory Visit (INDEPENDENT_AMBULATORY_CARE_PROVIDER_SITE_OTHER): Payer: 59 | Admitting: Internal Medicine

## 2011-06-10 ENCOUNTER — Encounter: Payer: Self-pay | Admitting: Internal Medicine

## 2011-06-10 DIAGNOSIS — E069 Thyroiditis, unspecified: Secondary | ICD-10-CM

## 2011-06-10 NOTE — Progress Notes (Signed)
  Subjective:    Patient ID: Christine [name], female    DOB: 10-Jun-1971, 40 y.o.   MRN: [id number]  HPI Pt presents to clinic for followup of multiple medical problems. Pt recently noted right side neck prominence. [place] suggestive of thryoiditis. TSH mildly elevated and ft4 nl. Possible mild fatigue. The neck area may be slightly tender. No recent preceeding uri. No past h/o thyroid d/o. No other complaints.  Past Medical History  Diagnosis Date  . Migraine, unspecified, without mention of intractable migraine without mention of status migrainosus   . Miscarriage 2007  . Diverticulitis     recurrent  . Allergy   . Asthma     Acute asthmatic bronchitis 2011 while pregnant   Past Surgical History  Procedure Date  . Laparoscopy 5/94    x 2  . Dilation and curettage of uterus     miscarriage  . Sigmoidectomy     Hand-assisted laparoscopic sigmoidectomy with splenic flexure takedown.    reports that she has quit smoking. She has never used smokeless tobacco. She reports that she drinks alcohol. She reports that she does not use illicit drugs. family history includes Alcohol abuse in her other; Arthritis in her other; Barrett's esophagus in her mother; Clotting disorder in her other; Colon polyps in her father; Coronary artery disease in her other; Crohn's disease in her cousin; Diabetes in her father, maternal grandfather, and mother; Hyperlipidemia in her other; Hypertension in her other; and Stroke in her other.  There is no history of Colon cancer. Allergies  Allergen Reactions  . Hydrocodone   . Penicillins      Review of Systems see hpi     Objective:   Physical Exam  Nursing note and vitals reviewed. Constitutional: She appears well-developed and well-nourished. No distress.  HENT:  Head: Normocephalic.  Neurological: She is alert.  Skin: She is not diaphoretic.  Psychiatric: She has a normal mood and affect.          Assessment & Plan:

## 2011-06-13 DIAGNOSIS — E039 Hypothyroidism, unspecified: Secondary | ICD-10-CM | POA: Insufficient documentation

## 2011-06-13 NOTE — Assessment & Plan Note (Signed)
Endocrinology consult.

## 2011-10-12 ENCOUNTER — Ambulatory Visit (INDEPENDENT_AMBULATORY_CARE_PROVIDER_SITE_OTHER): Payer: 59 | Admitting: Internal Medicine

## 2011-10-12 ENCOUNTER — Encounter: Payer: Self-pay | Admitting: Internal Medicine

## 2011-10-12 VITALS — BP 110/78 | HR 84 | Temp 98.4°F | Resp 16 | Wt 207.1 lb

## 2011-10-12 DIAGNOSIS — R1011 Right upper quadrant pain: Secondary | ICD-10-CM

## 2011-10-12 DIAGNOSIS — J329 Chronic sinusitis, unspecified: Secondary | ICD-10-CM

## 2011-10-12 MED ORDER — LEVOFLOXACIN 500 MG PO TABS: 500.0000 mg | ORAL_TABLET | Freq: Every day | ORAL | Status: AC

## 2011-10-12 MED ORDER — FLUTICASONE PROPIONATE 50 MCG/ACT NA SUSP: 2.0000 | Freq: Every day | NASAL | Status: DC

## 2011-10-12 MED ORDER — LEVOCETIRIZINE DIHYDROCHLORIDE 5 MG PO TABS: 5.0000 mg | ORAL_TABLET | Freq: Every evening | ORAL | Status: DC

## 2011-10-12 NOTE — Patient Instructions (Signed)
Please schedule your abdominal ultrasound for a time when you are fasting. The order has been entered

## 2011-10-12 NOTE — Progress Notes (Signed)
  Subjective:    Patient ID: Christine [name], female    DOB: Jan 11, 1971, 41 y.o.   MRN: [id number]  HPI Pt presents to clinic for evaluation of cough. Notes two month h/o intermittent cough. Sometimes productive for green or yellow sputum. H/o rhinitis off xyzal after having failed zyrtec, claritin, allegra in the past for at least 30 days. Wonders if sinus related. Does also note chronic intermittent ruq abd pain that radiates to back. Has associated nausea with emesis. +bloating/gas. Seeing endocrinology for hypothyroidism maintained on synthroid.  Past Medical History  Diagnosis Date  . Migraine, unspecified, without mention of intractable migraine without mention of status migrainosus   . Miscarriage 2007  . Diverticulitis     recurrent  . Allergy   . Asthma     Acute asthmatic bronchitis 2011 while pregnant   Past Surgical History  Procedure Date  . Laparoscopy 5/94    x 2  . Dilation and curettage of uterus     miscarriage  . Sigmoidectomy     Hand-assisted laparoscopic sigmoidectomy with splenic flexure takedown.    reports that she has quit smoking. She has never used smokeless tobacco. She reports that she drinks alcohol. She reports that she does not use illicit drugs. family history includes Alcohol abuse in her other; Arthritis in her other; Barrett's esophagus in her mother; Clotting disorder in her other; Colon polyps in her father; Coronary artery disease in her other; Crohn's disease in her cousin; Diabetes in her father, maternal grandfather, and mother; Hyperlipidemia in her other; Hypertension in her other; and Stroke in her other.  There is no history of Colon cancer. Allergies  Allergen Reactions  . Hydrocodone   . Penicillins        Review of Systems see hpi    Objective:   Physical Exam  Constitutional: She appears well-developed and well-nourished. No distress.  HENT:  Head: Normocephalic and atraumatic.  Right Ear: External ear normal.  Left Ear:  External ear normal.  Nose: Mucosal edema present.  Mouth/Throat: Oropharynx is clear and moist. No oropharyngeal exudate.  Eyes: Conjunctivae are normal. No scleral icterus.  Neck: Neck supple.  Pulmonary/Chest: Effort normal and breath sounds normal. No respiratory distress. She has no wheezes. She has no rales.  Neurological: She is alert.  Skin: Skin is warm and dry. She is not diaphoretic.  Psychiatric: She has a normal mood and affect.  Abd: soft, nd, nt. +bs        Assessment & Plan:

## 2011-10-13 ENCOUNTER — Other Ambulatory Visit (HOSPITAL_BASED_OUTPATIENT_CLINIC_OR_DEPARTMENT_OTHER): Payer: 59

## 2011-10-14 ENCOUNTER — Ambulatory Visit (HOSPITAL_BASED_OUTPATIENT_CLINIC_OR_DEPARTMENT_OTHER)
Admission: RE | Admit: 2011-10-14 | Discharge: 2011-10-14 | Disposition: A | Discharge status: Home / Self Care | Payer: 59 | Source: Ambulatory Visit | Attending: Internal Medicine | Admitting: Internal Medicine

## 2011-10-14 DIAGNOSIS — M549 Dorsalgia, unspecified: Secondary | ICD-10-CM

## 2011-10-14 DIAGNOSIS — R1011 Right upper quadrant pain: Secondary | ICD-10-CM

## 2011-10-14 DIAGNOSIS — R110 Nausea: Secondary | ICD-10-CM

## 2011-10-17 DIAGNOSIS — J329 Chronic sinusitis, unspecified: Secondary | ICD-10-CM | POA: Insufficient documentation

## 2011-10-17 DIAGNOSIS — R1010 Upper abdominal pain, unspecified: Secondary | ICD-10-CM | POA: Insufficient documentation

## 2011-10-17 NOTE — Assessment & Plan Note (Signed)
Attempt levaquin and flonase. Resume xyzal

## 2011-10-17 NOTE — Assessment & Plan Note (Signed)
Schedule ruq [place]. If neg consider HIDA scan

## 2011-10-27 ENCOUNTER — Telehealth: Payer: Self-pay | Admitting: *Deleted

## 2011-10-27 NOTE — Telephone Encounter (Signed)
Patient called and left voice message stating she was seen last week and she is not feeling any better. Her message states she still has sinus pressure, sore throat, and a lingering cough. She states she has completed the antibiotics that were prescribed and would like to know if Dr Rodena Medin would prescribe another round for her to take.

## 2011-10-27 NOTE — Telephone Encounter (Signed)
Repeat levaquin 

## 2011-10-28 MED ORDER — LEVOFLOXACIN 500 MG PO TABS: 500.0000 mg | ORAL_TABLET | Freq: Every day | ORAL | Status: DC

## 2011-10-28 NOTE — Telephone Encounter (Signed)
Call placed to patient at [phone], she was informed to repeat antibiotic. Rx sent to pharmacy

## 2011-11-01 ENCOUNTER — Telehealth: Payer: Self-pay | Admitting: Internal Medicine

## 2011-11-01 NOTE — Telephone Encounter (Signed)
Patient states that she is on her 2nd round of antibiotics. She started 2nd round last Thursday and is not feeling any better. She states that she is now having chest congestion/wheezing. She would like to know if Dr. Rodena Medin could prescribe her something else?

## 2011-11-01 NOTE — Telephone Encounter (Signed)
Had two rounds of abx. May need cxr. pls offer appt

## 2011-11-01 NOTE — Telephone Encounter (Signed)
Call placed to patient at [phone], she was informed per Dr Rodena Medin instructions. She has scheduled for Tuesday 11/02/2011 @ 10:15 am.

## 2011-11-02 ENCOUNTER — Ambulatory Visit (HOSPITAL_BASED_OUTPATIENT_CLINIC_OR_DEPARTMENT_OTHER)
Admission: RE | Admit: 2011-11-02 | Discharge: 2011-11-02 | Disposition: A | Discharge status: Home / Self Care | Payer: 59 | Source: Ambulatory Visit | Attending: Internal Medicine | Admitting: Internal Medicine

## 2011-11-02 ENCOUNTER — Encounter: Payer: Self-pay | Admitting: Internal Medicine

## 2011-11-02 ENCOUNTER — Ambulatory Visit (INDEPENDENT_AMBULATORY_CARE_PROVIDER_SITE_OTHER): Payer: 59 | Admitting: Internal Medicine

## 2011-11-02 VITALS — BP 112/72 | HR 90 | Temp 98.1°F | Resp 18

## 2011-11-02 DIAGNOSIS — R062 Wheezing: Secondary | ICD-10-CM | POA: Insufficient documentation

## 2011-11-02 DIAGNOSIS — R0989 Other specified symptoms and signs involving the circulatory and respiratory systems: Secondary | ICD-10-CM

## 2011-11-02 DIAGNOSIS — R05 Cough: Secondary | ICD-10-CM

## 2011-11-02 DIAGNOSIS — R059 Cough, unspecified: Secondary | ICD-10-CM

## 2011-11-02 MED ORDER — GUAIFENESIN-CODEINE 100-10 MG/5ML PO SYRP: 5.0000 mL | ORAL_SOLUTION | Freq: Three times a day (TID) | ORAL | Status: AC | PRN

## 2011-11-02 MED ORDER — CEFUROXIME AXETIL 500 MG PO TABS: 500.0000 mg | ORAL_TABLET | Freq: Two times a day (BID) | ORAL | Status: AC

## 2011-11-02 MED ORDER — ALBUTEROL SULFATE HFA 108 (90 BASE) MCG/ACT IN AERS: 2.0000 | INHALATION_SPRAY | Freq: Four times a day (QID) | RESPIRATORY_TRACT | Status: DC | PRN

## 2011-11-02 NOTE — Progress Notes (Signed)
  Subjective:    Patient ID: Christine [name], female    DOB: 12-11-1970, 41 y.o.   MRN: [id number]  HPI Pt presents to clinic for evaluation of cough. Recent seen and treated for possible bronchitis with levaquin-course extended after phone call indicating persistent sx's. States now after several days of abx had definite improvement however most recently worsened with subjective fever, intermittent wheezing and left ear pain. Albuterol mdi has expired. 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 No other alleviating or exacerbating factors.   Past Medical History  Diagnosis Date  . Migraine, unspecified, without mention of intractable migraine without mention of status migrainosus   . Miscarriage 2007  . Diverticulitis     recurrent  . Allergy   . Asthma     Acute asthmatic bronchitis 2011 while pregnant   Past Surgical History  Procedure Date  . Laparoscopy 5/94    x 2  . Dilation and curettage of uterus     miscarriage  . Sigmoidectomy     Hand-assisted laparoscopic sigmoidectomy with splenic flexure takedown.    reports that she has quit smoking. She has never used smokeless tobacco. She reports that she drinks alcohol. She reports that she does not use illicit drugs. family history includes Alcohol abuse in her other; Arthritis in her other; Barrett's esophagus in her mother; Clotting disorder in her other; Colon polyps in her father; Coronary artery disease in her other; Crohn's disease in her cousin; Diabetes in her father, maternal grandfather, and mother; Hyperlipidemia in her other; Hypertension in her other; and Stroke in her other.  There is no history of Colon cancer. Allergies  Allergen Reactions  . Hydrocodone   . Penicillins      Review of Systems see hpi     Objective:   Physical Exam  Nursing note and vitals reviewed. Constitutional: She appears well-developed and well-nourished. No distress.  HENT:  Head: Normocephalic and atraumatic.  Right Ear: Tympanic  membrane, external ear and ear canal normal.  Left Ear: Tympanic membrane, external ear and ear canal normal.  Nose: Nose normal.  Mouth/Throat: Oropharynx is clear and moist. No oropharyngeal exudate.  Eyes: Conjunctivae are normal. No scleral icterus.  Neck: Neck supple.  Pulmonary/Chest: Effort normal and breath sounds normal. No respiratory distress. She has no wheezes. She has no rales.  Lymphadenopathy:    She has no cervical adenopathy.  Neurological: She is alert.  Skin: Skin is warm and dry. She is not diaphoretic.  Psychiatric: She has a normal mood and affect.          Assessment & Plan:

## 2011-11-02 NOTE — Assessment & Plan Note (Signed)
?  unresolving initial infection vs two separate infections in close temporal proximity. Recommend cautious approach with cxr and change to alternative abx. Given albuterol mdi prn and codeine cough syrup prn. Followup if no improvement or worsening.

## 2012-07-04 ENCOUNTER — Emergency Department (HOSPITAL_BASED_OUTPATIENT_CLINIC_OR_DEPARTMENT_OTHER)
Admission: EM | Admit: 2012-07-04 | Discharge: 2012-07-04 | Disposition: A | Discharge status: Home / Self Care | Payer: 59 | Attending: Emergency Medicine | Admitting: Emergency Medicine

## 2012-07-04 ENCOUNTER — Telehealth: Payer: Self-pay | Admitting: *Deleted

## 2012-07-04 ENCOUNTER — Encounter (HOSPITAL_BASED_OUTPATIENT_CLINIC_OR_DEPARTMENT_OTHER): Payer: Self-pay | Admitting: Family Medicine

## 2012-07-04 DIAGNOSIS — Z79899 Other long term (current) drug therapy: Secondary | ICD-10-CM | POA: Insufficient documentation

## 2012-07-04 DIAGNOSIS — R109 Unspecified abdominal pain: Secondary | ICD-10-CM

## 2012-07-04 DIAGNOSIS — Z87891 Personal history of nicotine dependence: Secondary | ICD-10-CM | POA: Insufficient documentation

## 2012-07-04 DIAGNOSIS — K219 Gastro-esophageal reflux disease without esophagitis: Secondary | ICD-10-CM

## 2012-07-04 DIAGNOSIS — R079 Chest pain, unspecified: Secondary | ICD-10-CM

## 2012-07-04 DIAGNOSIS — J45909 Unspecified asthma, uncomplicated: Secondary | ICD-10-CM | POA: Insufficient documentation

## 2012-07-04 DIAGNOSIS — Z8669 Personal history of other diseases of the nervous system and sense organs: Secondary | ICD-10-CM | POA: Insufficient documentation

## 2012-07-04 DIAGNOSIS — Z9889 Other specified postprocedural states: Secondary | ICD-10-CM | POA: Insufficient documentation

## 2012-07-04 DIAGNOSIS — Z8719 Personal history of other diseases of the digestive system: Secondary | ICD-10-CM | POA: Insufficient documentation

## 2012-07-04 DIAGNOSIS — R1013 Epigastric pain: Secondary | ICD-10-CM | POA: Insufficient documentation

## 2012-07-04 LAB — URINALYSIS, ROUTINE W REFLEX MICROSCOPIC
Glucose, UA: NEGATIVE mg/dL
Ketones, ur: NEGATIVE mg/dL
Leukocytes, UA: NEGATIVE
Nitrite: NEGATIVE
Specific Gravity, Urine: 1.005 (ref 1.005–1.030)
pH: 6 (ref 5.0–8.0)

## 2012-07-04 LAB — COMPREHENSIVE METABOLIC PANEL
ALT: 19 U/L (ref 0–35)
AST: 17 U/L (ref 0–37)
Albumin: 4.2 g/dL (ref 3.5–5.2)
Alkaline Phosphatase: 71 U/L (ref 39–117)
CO2: 24 mEq/L (ref 19–32)
Chloride: 103 mEq/L (ref 96–112)
Creatinine, Ser: 0.7 mg/dL (ref 0.50–1.10)
GFR calc non Af Amer: >90 mL/min (ref >90)
Potassium: 3.9 mEq/L (ref 3.5–5.1)
Sodium: 137 mEq/L (ref 135–145)
Total Bilirubin: 0.4 mg/dL (ref 0.3–1.2)

## 2012-07-04 LAB — CBC
MCH: 29.6 pg (ref 26.0–34.0)
MCHC: 34.8 g/dL (ref 30.0–36.0)
MCV: 85.1 fL (ref 78.0–100.0)
Platelets: 223 10*3/uL (ref 150–400)
RDW: 13 % (ref 11.5–15.5)

## 2012-07-04 LAB — TROPONIN I: Troponin I: <0.3 ng/mL (ref <0.30)

## 2012-07-04 MED ORDER — OXYCODONE-ACETAMINOPHEN 5-325 MG PO TABS
1.0000 | ORAL_TABLET | Freq: Once | ORAL | Status: DC
  Filled 2012-07-04 (×2): qty 1

## 2012-07-04 MED ORDER — PANTOPRAZOLE SODIUM 40 MG PO TBEC: 40.0000 mg | DELAYED_RELEASE_TABLET | Freq: Every day | ORAL | Status: DC

## 2012-07-04 MED ORDER — GI COCKTAIL ~~LOC~~
30.0000 mL | Freq: Once | ORAL | Status: AC
  Administered 2012-07-04: 30 mL via ORAL
  Filled 2012-07-04: qty 30

## 2012-07-04 MED ORDER — FAMOTIDINE 20 MG PO TABS
20.0000 mg | ORAL_TABLET | Freq: Once | ORAL | Status: AC
  Administered 2012-07-04: 20 mg via ORAL
  Filled 2012-07-04: qty 1

## 2012-07-04 NOTE — ED Notes (Signed)
Pt refused Percocet, states pain is tolerable and she doesn't want to feel sedated.

## 2012-07-04 NOTE — ED Notes (Signed)
MD at bedside. 

## 2012-07-04 NOTE — ED Notes (Addendum)
Pt c/o central chest pressure, dull pain to upper back and tingling down right arm since last night. Pt sts pain 4/10 at present. Pt denies dizziness, n/v, shob at present. Pt reports initially felt like "heartburn". Pt also sts she has h/o gallbladder issues and c/o RUQ pain intermittently.

## 2012-07-04 NOTE — Telephone Encounter (Signed)
FYI: Pt called c/o chest pain & pressure that radiates to R shoulder & thoracic back [feels she may have a gallbladder issue] and has been seen in past for this problem. Pt instructed to get medical A&E at urgent facility, either UC or ED, where they have capability to do Imaging and/or other Stat testing not available in PCPs office; pt understood & agreed, will make f/u appt after being seen in urgent care facility/SLS

## 2012-07-04 NOTE — ED Provider Notes (Addendum)
History     CSN: [id number]  Arrival date & time 07/04/12  1016   First MD Initiated Contact with Patient 07/04/12 1044      Chief Complaint  Patient presents with  . Chest Pain    (Consider location/radiation/quality/duration/timing/severity/associated sxs/prior treatment) Patient is a 41 y.o. female presenting with chest pain. The history is provided by the patient.  Chest Pain Primary symptoms include abdominal pain. Pertinent negatives for primary symptoms include no fever, no shortness of breath, no cough, no palpitations and no vomiting.   pt c/o pain, epigastric area, lower sternal region, for past few days. Constant in past day. Dull, burning, midline. Occasionally feels in back, between scapula. No tearing or ripping pain, states is like heartburn. Occurs at rest. No consistent relation to eating. No relation to activity or exertion. No associated nv, diaphoresis or sob. No palpitations. No unusual doe or fatigue.  Hx cad in parents in older age, no premature cad. Pain is not pleuritic. No leg pain or swelling, no dvt or pe hx. Notes hx gi eval for same symptoms in past, states prior remote egd w gerd/esophagitis. Indicates had u/s last spring negative for gallstones. No hx pud, pancreatitis. Denies fever or chills.     Past Medical History  Diagnosis Date  . Migraine, unspecified, without mention of intractable migraine without mention of status migrainosus   . Miscarriage 2007  . Diverticulitis     recurrent  . Allergy   . Asthma     Acute asthmatic bronchitis 2011 while pregnant    Past Surgical History  Procedure Date  . Laparoscopy 5/94    x 2  . Dilation and curettage of uterus     miscarriage  . Sigmoidectomy     Hand-assisted laparoscopic sigmoidectomy with splenic flexure takedown.    Family History  Problem Relation Age of Onset  . Barrett's esophagus Mother   . Diabetes Mother   . Colon polyps Father   . Diabetes Father   . Diabetes Maternal  Grandfather   . Colon cancer Neg Hx   . Alcohol abuse Other   . Arthritis Other   . Hyperlipidemia Other   . Hypertension Other   . Stroke Other   . Coronary artery disease Other   . Clotting disorder Other     Mudlogger  . Crohn's disease Cousin     History  Substance Use Topics  . Smoking status: Former Games developer  . Smokeless tobacco: Never Used  . Alcohol Use: Yes    OB History    Grav Para Term Preterm Abortions TAB SAB Ect Mult Living                  Review of Systems  Constitutional: Negative for fever and chills.  HENT: Negative for neck pain.   Eyes: Negative for redness.  Respiratory: Negative for cough and shortness of breath.   Cardiovascular: Positive for chest pain. Negative for palpitations and leg swelling.  Gastrointestinal: Positive for abdominal pain. Negative for vomiting and diarrhea.  Genitourinary: Negative for flank pain.  Musculoskeletal: Negative for myalgias.  Skin: Negative for rash.  Neurological: Negative for headaches.  Hematological: Does not bruise/bleed easily.  Psychiatric/Behavioral: Negative for confusion.    Allergies  Hydrocodone and Penicillins  Home Medications   Current Outpatient Rx  Name  Route  Sig  Dispense  Refill  . ALBUTEROL SULFATE HFA 108 (90 BASE) MCG/ACT IN AERS   Inhalation   Inhale 2 puffs into the lungs  every 6 (six) hours as needed for wheezing.   1 Inhaler   6   . LEVOCETIRIZINE DIHYDROCHLORIDE 5 MG PO TABS   Oral   Take 1 tablet (5 mg total) by mouth every evening. preauth if necessary. Has failed multiple other antihistamines for >30days   30 tablet   11   . RANITIDINE HCL 150 MG PO TABS   Oral   Take 150 mg by mouth daily as needed.         Marland Kitchen SYNTHROID 50 MCG PO TABS   Oral   Take 1 tablet by mouth daily.           BP 150/90  Pulse 82  Temp 98.4 F (36.9 C) (Oral)  Resp 18  SpO2 100%  LMP 06/09/2012  Physical Exam  Nursing note and vitals reviewed. Constitutional: She  appears well-developed and well-nourished. No distress.  Eyes: Conjunctivae normal are normal. No scleral icterus.  Neck: Neck supple. No tracheal deviation present.  Cardiovascular: Normal rate, regular rhythm, normal heart sounds and intact distal pulses.  Exam reveals no gallop and no friction rub.   No murmur heard. Pulmonary/Chest: Effort normal and breath sounds normal. No respiratory distress. She exhibits no tenderness.  Abdominal: Soft. Normal appearance. She exhibits no distension and no mass. There is no tenderness. There is no rebound and no guarding.  Genitourinary:       No cva tenderness  Musculoskeletal: She exhibits no edema and no tenderness.  Neurological: She is alert.  Skin: Skin is warm and dry. No rash noted.  Psychiatric: She has a normal mood and affect.    ED Course  Procedures (including critical care time)   Labs Reviewed  PREGNANCY, URINE  URINALYSIS, ROUTINE W REFLEX MICROSCOPIC  CBC  COMPREHENSIVE METABOLIC PANEL  LIPASE, BLOOD    Results for orders placed during the hospital encounter of 07/04/12  PREGNANCY, URINE      Component Value Range   Preg Test, Ur NEGATIVE  NEGATIVE  URINALYSIS, ROUTINE W REFLEX MICROSCOPIC      Component Value Range   Color, Urine YELLOW  YELLOW   APPearance CLEAR  CLEAR   Specific Gravity, Urine 1.005  1.005 - 1.030   pH 6.0  5.0 - 8.0   Glucose, UA NEGATIVE  NEGATIVE mg/dL   Hgb urine dipstick NEGATIVE  NEGATIVE   Bilirubin Urine NEGATIVE  NEGATIVE   Ketones, ur NEGATIVE  NEGATIVE mg/dL   Protein, ur NEGATIVE  NEGATIVE mg/dL   Urobilinogen, UA 0.2  0.0 - 1.0 mg/dL   Nitrite NEGATIVE  NEGATIVE   Leukocytes, UA NEGATIVE  NEGATIVE  COMPREHENSIVE METABOLIC PANEL      Component Value Range   Sodium 137  135 - 145 mEq/L   Potassium 3.9  3.5 - 5.1 mEq/L   Chloride 103  96 - 112 mEq/L   CO2 24  19 - 32 mEq/L   Glucose, Bld 94  70 - 99 mg/dL   BUN 11  6 - 23 mg/dL   Creatinine, Ser 1.61  0.50 - 1.10 mg/dL    Calcium 9.4  8.4 - 09.6 mg/dL   Total Protein 8.0  6.0 - 8.3 g/dL   Albumin 4.2  3.5 - 5.2 g/dL   AST 17  0 - 37 U/L   ALT 19  0 - 35 U/L   Alkaline Phosphatase 71  39 - 117 U/L   Total Bilirubin 0.4  0.3 - 1.2 mg/dL   GFR calc non Af Amer >  90  >90 mL/min   GFR calc Af Amer >90  >90 mL/min  CBC      Component Value Range   WBC 6.9  4.0 - 10.5 K/uL   RBC 4.49  3.87 - 5.11 MIL/uL   Hemoglobin 13.3  12.0 - 15.0 g/dL   HCT 40.9  81.1 - 91.4 %   MCV 85.1  78.0 - 100.0 fL   MCH 29.6  26.0 - 34.0 pg   MCHC 34.8  30.0 - 36.0 g/dL   RDW 78.2  95.6 - 21.3 %   Platelets 223  150 - 400 K/uL  LIPASE, BLOOD      Component Value Range   Lipase 47  11 - 59 U/L      MDM  Pt notes hx gerd, prior remote hx egd for same/esophagitis. Pepcid, gi cocktail, percocet 1 po (has ride, does not have to drive).   Date: 07/04/2012  Rate: 75  Rhythm: normal sinus rhythm  QRS Axis: normal  Intervals: normal  ST/T Wave abnormalities: normal  Conduction Disutrbances:none  Narrative Interpretation:   Old EKG Reviewed: none available  After constant symptoms >24 hrs, ecg normal, trop neg.  Pt notes hx gerd/esophagitis, not currently on any regular acid blocker rx.  Will rx protonix.   Also discussed gi f/u.  U/s earlier in year neg for gallstones (u/s tech not currently available in ed),  Discussed plan for gi f/u, possible repeat u/s, hida etc.   Pt appears stable for d/c. Symptoms improved w gi meds.   Pt states has seen [facility] gi in past, will refer back.          Suzi Roots, MD 07/04/12 1132  Suzi Roots, MD 07/04/12 [phone]

## 2012-07-06 ENCOUNTER — Ambulatory Visit (INDEPENDENT_AMBULATORY_CARE_PROVIDER_SITE_OTHER): Payer: 59 | Admitting: Internal Medicine

## 2012-07-06 ENCOUNTER — Encounter: Payer: Self-pay | Admitting: Internal Medicine

## 2012-07-06 VITALS — BP 120/78 | HR 78 | Temp 98.4°F | Resp 16 | Wt 209.0 lb

## 2012-07-06 DIAGNOSIS — R1010 Upper abdominal pain, unspecified: Secondary | ICD-10-CM

## 2012-07-06 DIAGNOSIS — R109 Unspecified abdominal pain: Secondary | ICD-10-CM

## 2012-07-06 MED ORDER — SUCRALFATE 1 GM/10ML PO SUSP: ORAL | Status: DC

## 2012-07-06 NOTE — Progress Notes (Signed)
  Subjective:    Patient ID: Christine [name], female    DOB: 1971-04-13, 41 y.o.   MRN: [id number]  HPI Pt presents to clinic for evaluation of abdominal pain. Continues to note chronic intermittent right upper and upper mid abdominal pain that radiates to the back. No associated n/v. Underwent neg abd [place] earlier this year. S/p recent ED visit with addition of protonix. Has taken for 2 days.  Past Medical History  Diagnosis Date  . Migraine, unspecified, without mention of intractable migraine without mention of status migrainosus   . Miscarriage 2007  . Diverticulitis     recurrent  . Allergy   . Asthma     Acute asthmatic bronchitis 2011 while pregnant   Past Surgical History  Procedure Date  . Laparoscopy 5/94    x 2  . Dilation and curettage of uterus     miscarriage  . Sigmoidectomy     Hand-assisted laparoscopic sigmoidectomy with splenic flexure takedown.    reports that she has quit smoking. She has never used smokeless tobacco. She reports that she drinks alcohol. She reports that she does not use illicit drugs. family history includes Alcohol abuse in her other; Arthritis in her other; Barrett's esophagus in her mother; Clotting disorder in her other; Colon polyps in her father; Coronary artery disease in her other; Crohn's disease in her cousin; Diabetes in her father, maternal grandfather, and mother; Hyperlipidemia in her other; Hypertension in her other; and Stroke in her other.  There is no history of Colon cancer. Allergies  Allergen Reactions  . Hydrocodone   . Penicillins      Review of Systems see hpi     Objective:   Physical Exam  Constitutional: She appears well-developed and well-nourished. No distress.  HENT:  Head: Normocephalic and atraumatic.  Eyes: Conjunctivae normal are normal. No scleral icterus.  Abdominal: Soft. Bowel sounds are normal. She exhibits no distension and no mass. There is no hepatosplenomegaly. There is tenderness in the right  upper quadrant and epigastric area. There is no rebound and no guarding.  Neurological: She is alert.  Skin: Skin is warm and dry. She is not diaphoretic.  Psychiatric: She has a normal mood and affect.          Assessment & Plan:

## 2012-07-06 NOTE — Assessment & Plan Note (Signed)
Discussed GB etiology versus gastric. With neg [place] discussed HIDA however also has epigastric tenderness. Recommend continuation of PPI. Add short term carafate. Recommend GI consult and pt states has appt next week.

## 2012-07-11 ENCOUNTER — Other Ambulatory Visit (HOSPITAL_BASED_OUTPATIENT_CLINIC_OR_DEPARTMENT_OTHER): Payer: Self-pay | Admitting: Endocrinology

## 2012-07-11 DIAGNOSIS — E010 Iodine-deficiency related diffuse (endemic) goiter: Secondary | ICD-10-CM

## 2012-07-13 ENCOUNTER — Ambulatory Visit (HOSPITAL_BASED_OUTPATIENT_CLINIC_OR_DEPARTMENT_OTHER): Payer: 59

## 2012-07-13 ENCOUNTER — Encounter: Payer: Self-pay | Admitting: Gastroenterology

## 2012-07-13 ENCOUNTER — Ambulatory Visit (HOSPITAL_BASED_OUTPATIENT_CLINIC_OR_DEPARTMENT_OTHER)
Admission: RE | Admit: 2012-07-13 | Discharge: 2012-07-13 | Disposition: A | Discharge status: Home / Self Care | Payer: 59 | Source: Ambulatory Visit | Attending: Endocrinology | Admitting: Endocrinology

## 2012-07-13 ENCOUNTER — Ambulatory Visit (INDEPENDENT_AMBULATORY_CARE_PROVIDER_SITE_OTHER): Payer: 59 | Admitting: Gastroenterology

## 2012-07-13 ENCOUNTER — Other Ambulatory Visit: Payer: Self-pay | Admitting: *Deleted

## 2012-07-13 VITALS — BP 126/84 | HR 89 | Ht 69.0 in | Wt 211.4 lb

## 2012-07-13 DIAGNOSIS — E669 Obesity, unspecified: Secondary | ICD-10-CM

## 2012-07-13 DIAGNOSIS — K219 Gastro-esophageal reflux disease without esophagitis: Secondary | ICD-10-CM

## 2012-07-13 DIAGNOSIS — E039 Hypothyroidism, unspecified: Secondary | ICD-10-CM | POA: Insufficient documentation

## 2012-07-13 DIAGNOSIS — Z9889 Other specified postprocedural states: Secondary | ICD-10-CM

## 2012-07-13 DIAGNOSIS — E010 Iodine-deficiency related diffuse (endemic) goiter: Secondary | ICD-10-CM

## 2012-07-13 DIAGNOSIS — E049 Nontoxic goiter, unspecified: Secondary | ICD-10-CM | POA: Insufficient documentation

## 2012-07-13 DIAGNOSIS — Z9049 Acquired absence of other specified parts of digestive tract: Secondary | ICD-10-CM

## 2012-07-13 DIAGNOSIS — R1013 Epigastric pain: Secondary | ICD-10-CM

## 2012-07-13 MED ORDER — PANTOPRAZOLE SODIUM 40 MG PO TBEC: 40.0000 mg | DELAYED_RELEASE_TABLET | Freq: Two times a day (BID) | ORAL | Status: DC

## 2012-07-13 MED ORDER — HYOSCYAMINE SULFATE ER 0.375 MG PO TB12: 0.3750 mg | ORAL_TABLET | Freq: Two times a day (BID) | ORAL | Status: DC | PRN

## 2012-07-13 MED ORDER — SUCRALFATE 1 GM/10ML PO SUSP: 1.0000 g | Freq: Three times a day (TID) | ORAL | Status: DC

## 2012-07-13 NOTE — Progress Notes (Signed)
History of Present Illness:  This is a 41 year old Caucasian female has had recurrent subxiphoid pain radiating into her back which has been so severe she's had 2 emergency room visits with negative EKG, ultrasound, and general exam.  She's been placed on Protonix 40 mg a day with when necessary Carafate suspension and continues to have subxiphoid pain radiating into her mid back and right subscapular area.  She denies any hepatobiliary or lower gastrointestinal symptoms.  She has had previous sigmoid resection for recurrent diverticulitis, as had colonoscopy within the last 2 years..  She denies abuse of alcohol, cigarettes, or NSAIDs.  Reviewed her chart I cannot see a previous endoscopic exam.  Patient denies dysphagia, melena, or hematochezia.  A previously saw this patient and December 2010 for constipation, but she denies constipation problems at this time.  You have her ultrasound exam shows no abnormalities, and CBC, metabolic profile, and urinalysis have been unremarkable.  I have reviewed this patient's present history, medical and surgical past history, allergies and medications.     ROS: The remainder of the 10 point ROS is negative     Physical Exam: Blood pressure 126/84, pulse 89 and regular, and weight 211 pounds the BMI of 31.2 to.  Oxygen saturation 98%. General well developed well nourished patient in no acute distress, appearing their stated age Eyes PERRLA, no icterus, fundoscopic exam per opthamologist Skin no lesions noted Neck supple, no adenopathy, no thyroid enlargement, no tenderness Chest clear to percussion and auscultation Heart no significant murmurs, gallops or rubs noted Abdomen no hepatosplenomegaly masses or tenderness, BS normal.  Some mild epigastric tenderness noted but no rebound. Extremities no acute joint lesions, edema, phlebitis or evidence of cellulitis. Neurologic patient oriented x 3, cranial nerves intact, no focal neurologic deficits  noted. Psychological mental status normal and normal affect.  Assessment and plan: GERD which actually been present for several years with recent worsening possibly related to mild obesity.  I have increased her Protonix to 40 mg twice a day with when necessary Carafate, when necessary sublingual Levsin, and we'll proceed with endoscopic exam, and have also scheduled her for an esophageal high-resolution manometry.  Reflux regime reviewed with patient in detail.  Do not think she needs followup colonoscopy at this time.  Please copy her primary care  No diagnosis found.

## 2012-07-13 NOTE — Patient Instructions (Addendum)
You have been scheduled for an esophageal manometry at [facility] on 09-11-2012 at 11:30 AM. Please arrive 30 minutes prior to your procedure for registration. You will need to go to outpatient registration (1st floor of the hospital) first. Make certain to bring your insurance cards as well as a complete list of medications.  Please remember the following:  1) Nothing to eat or drink after 12:00 midnight on the night before your test.  2) Hold all diabetic medications/insulin the morning of the test. You may eat and take your medications after the test.  3) For 3 days prior to your test do not take: Dexilant, Prevacid, Nexium, Protonix, Aciphex, Zegerid, Pantoprazole, Prilosec or omeprazole.  4) For 2 days prior to your test, do not take: Reglan, Tagamet, Zantac, Axid or Pepcid.  5) You MAY use an antacid such as Rolaids or Tums up to 12 hours prior to your test.  It will take at least 2 weeks to receive the results of this test from your physician. ------------------------------------------ ABOUT ESOPHAGEAL MANOMETRY Esophageal manometry (muh-NOM-uh-tree) is a test that gauges how well your esophagus works. Your esophagus is the long, muscular tube that connects your throat to your stomach. Esophageal manometry measures the rhythmic muscle contractions (peristalsis) that occur in your esophagus when you swallow. Esophageal manometry also measures the coordination and force exerted by the muscles of your esophagus.  During esophageal manometry, a thin, flexible tube (catheter) that contains sensors is passed through your nose, down your esophagus and into your stomach. Esophageal manometry can be helpful in diagnosing some mostly uncommon disorders that affect your esophagus.  Why it's done Esophageal manometry is used to evaluate the movement (motility) of food through the esophagus and into the stomach. The test measures how well the circular bands of muscle (sphincters) at the top  and bottom of your esophagus open and close, as well as the pressure, strength and pattern of the wave of esophageal muscle contractions that moves food along.  What you can expect Esophageal manometry is an outpatient procedure done without sedation. Most people tolerate it well. You may be asked to change into a hospital gown before the test starts.  During esophageal manometry  While you are sitting up, a member of your health care team sprays your throat with a numbing medication or puts numbing gel in your nose or both.  A catheter is guided through your nose into your esophagus. The catheter may be sheathed in a water-filled sleeve. It doesn't interfere with your breathing. However, your eyes may water, and you may gag. You may have a slight nosebleed from irritation.  After the catheter is in place, you may be asked to lie on your back on an exam table, or you may be asked to remain seated.  You then swallow small sips of water. As you do, a computer connected to the catheter records the pressure, strength and pattern of your esophageal muscle contractions.  During the test, you'll be asked to breathe slowly and smoothly, remain as still as possible, and swallow only when you're asked to do so.  A member of your health care team may move the catheter down into your stomach while the catheter continues its measurements.  The catheter then is slowly withdrawn. The test usually lasts 20 to 30 minutes.  After esophageal manometry  When your esophageal manometry is complete, you may return to your normal activities  This test typically takes 30-45 minutes to complete. _______________________________________________________________________________________________________   We  have sent the following medications to your pharmacy for you to pick up at your convenience: Levbid and Carafate. Please take as directed   Please take Protonix twice daily. You were given a new prescription.  You have  been scheduled for an endoscopy with propofol. Please follow written instructions given to you at your visit today. If you use inhalers (even only as needed) or a CPAP machine, please bring them with you on the day of your procedure.  CC: Dr. Rodena Medin   ____________________________________________________________________________________________________________________  Diet for Gastroesophageal Reflux Disease, Adult Reflux (acid reflux) is when acid from your stomach flows up into the esophagus. When acid comes in contact with the esophagus, the acid causes irritation and soreness (inflammation) in the esophagus. When reflux happens often or so severely that it causes damage to the esophagus, it is called gastroesophageal reflux disease (GERD). Nutrition therapy can help ease the discomfort of GERD. FOODS OR DRINKS TO AVOID OR LIMIT  Smoking or chewing tobacco. Nicotine is one of the most potent stimulants to acid production in the gastrointestinal tract.  Caffeinated and decaffeinated coffee and black tea.  Regular or low-calorie carbonated beverages or energy drinks (caffeine-free carbonated beverages are allowed).   Strong spices, such as black pepper, white pepper, red pepper, cayenne, curry powder, and chili powder.  Peppermint or spearmint.  Chocolate.  High-fat foods, including meats and fried foods. Extra added fats including oils, butter, salad dressings, and nuts. Limit these to less than 8 tsp per day.  Fruits and vegetables if they are not tolerated, such as citrus fruits or tomatoes.  Alcohol.  Any food that seems to aggravate your condition. If you have questions regarding your diet, call your caregiver or a registered dietitian. OTHER THINGS THAT MAY HELP GERD INCLUDE:   Eating your meals slowly, in a relaxed setting.  Eating 5 to 6 small meals per day instead of 3 large meals.  Eliminating food for a period of time if it causes distress.  Not lying down until 3  hours after eating a meal.  Keeping the head of your bed raised 6 to 9 inches (15 to 23 cm) by using a foam wedge or blocks under the legs of the bed. Lying flat may make symptoms worse.  Being physically active. Weight loss may be helpful in reducing reflux in overweight or obese adults.  Wear loose fitting clothing EXAMPLE MEAL PLAN This meal plan is approximately 2,000 calories based on https://www.bernard.org/ meal planning guidelines. Breakfast   cup cooked oatmeal.  1 cup strawberries.  1 cup low-fat milk.  1 oz almonds. Snack  1 cup cucumber slices.  6 oz yogurt (made from low-fat or fat-free milk). Lunch  2 slice whole-wheat bread.  2 oz sliced [place].  2 tsp mayonnaise.  1 cup blueberries.  1 cup snap peas. Snack  6 whole-wheat crackers.  1 oz string cheese. Dinner   cup brown rice.  1 cup mixed veggies.  1 tsp olive oil.  3 oz grilled fish. Document Released: 07/19/2005 Document Revised: 10/11/2011 Document Reviewed: 06/04/2011 [facility] Patient Information 2013 [place], [place].

## 2012-07-17 ENCOUNTER — Ambulatory Visit (AMBULATORY_SURGERY_CENTER): Payer: 59 | Admitting: Gastroenterology

## 2012-07-17 ENCOUNTER — Encounter: Payer: Self-pay | Admitting: Gastroenterology

## 2012-07-17 VITALS — BP 126/73 | HR 59 | Temp 98.2°F | Resp 21 | Ht 69.0 in | Wt 211.0 lb

## 2012-07-17 DIAGNOSIS — D130 Benign neoplasm of esophagus: Secondary | ICD-10-CM

## 2012-07-17 DIAGNOSIS — R1013 Epigastric pain: Secondary | ICD-10-CM

## 2012-07-17 DIAGNOSIS — K2980 Duodenitis without bleeding: Secondary | ICD-10-CM

## 2012-07-17 DIAGNOSIS — K219 Gastro-esophageal reflux disease without esophagitis: Secondary | ICD-10-CM

## 2012-07-17 MED ORDER — SODIUM CHLORIDE 0.9 % IV SOLN: 500.0000 mL | INTRAVENOUS | Status: DC

## 2012-07-17 NOTE — Op Note (Signed)
[facility] [address] [place], [address]   ENDOSCOPY PROCEDURE REPORT  PATIENT: Christine, [name]  MR#: [id number] BIRTHDATE: 11-26-1970 , 41  yrs. old GENDER: Female ENDOSCOPIST:Franziska Podgurski Hale Bogus, MD, Clementeen Graham REFERRED BY: Charlynn Court, M.D. PROCEDURE DATE:  07/17/2012 PROCEDURE:   EGD w/ biopsy and EGD w/ biopsy for H.pylori ASA CLASS:    Class II INDICATIONS: Epigastric pain. MEDICATION: propofol (Diprivan) 300mg  IV TOPICAL ANESTHETIC:   Cetacaine Spray  DESCRIPTION OF PROCEDURE:   After the risks and benefits of the procedure were explained, informed consent was obtained.  [facility] [id number]  endoscope was introduced through the mouth  and advanced to the second portion of the duodenum .  The instrument was slowly withdrawn as the mucosa was fully examined.      ESOPHAGUS: The mucosa of the esophagus appeared normal.  Multiple biopsies were taken in the distal and mid esophagus to rule out eosinophilic esophagitis.  STOMACH: The mucosa of the stomach appeared normal.  DUODENUM: Mild duodenal inflammation was found in the duodenal bulb. Retroflexed views revealed no abnormalities.    The scope was then withdrawn from the patient and the procedure completed.  COMPLICATIONS: There were no complications.   ENDOSCOPIC IMPRESSION: 1.   The mucosa of the esophagus appeared normal; multiple biopsies were taken in the distal and mid esophagus to rule out eosinophilic esophagitis .Dx. most c/w nonerosive GERD. 2.   The mucosa of the stomach appeared normal 3.   Duodenal inflammation was found in the duodenal bulb  RECOMMENDATIONS: 1.  Await biopsy results 2.  Continue PPI 3. May need esophageal manometry exam depending on clinical course   _______________________________ eSigned:  Mardella Layman, MD, [facility] 07/17/2012 4:02 PM   standard discharge   PATIENT NAME:  Christine, [name] MR#: [id number]

## 2012-07-17 NOTE — Progress Notes (Signed)
Called to room to assist during endoscopic procedure.  Patient ID and intended procedure confirmed with present staff. Received instructions for my participation in the procedure from the performing physician.  

## 2012-07-17 NOTE — Patient Instructions (Addendum)

## 2012-07-17 NOTE — Progress Notes (Addendum)
Patient did not have preoperative order for IV antibiotic SSI prophylaxis. (G8918)  Patient did not experience any of the following events: a burn prior to discharge; a fall within the facility; wrong site/side/patient/procedure/implant event; or a hospital transfer or hospital admission upon discharge from the facility. (G8907)  

## 2012-07-18 ENCOUNTER — Telehealth: Payer: Self-pay | Admitting: *Deleted

## 2012-07-18 LAB — HELICOBACTER PYLORI SCREEN-BIOPSY: UREASE: NEGATIVE

## 2012-07-18 NOTE — Telephone Encounter (Signed)
  Follow up Call-  Call back number 07/17/2012  Post procedure Call Back phone  # [phone]  Permission to leave phone message Yes     Patient questions:  Do you have a fever, pain , or abdominal swelling? no Pain Score  0 *  Have you tolerated food without any problems? yes  Have you been able to return to your normal activities? yes  Do you have any questions about your discharge instructions: Diet   no Medications  no Follow up visit  no  Do you have questions or concerns about your Care? no  Actions: * If pain score is 4 or above: No action needed, pain <4.

## 2012-07-19 ENCOUNTER — Encounter: Payer: 59 | Admitting: Gastroenterology

## 2012-08-01 ENCOUNTER — Encounter: Payer: Self-pay | Admitting: Gastroenterology

## 2012-08-18 ENCOUNTER — Other Ambulatory Visit: Payer: Self-pay | Admitting: Obstetrics and Gynecology

## 2012-08-18 DIAGNOSIS — N63 Unspecified lump in unspecified breast: Secondary | ICD-10-CM

## 2012-08-18 DIAGNOSIS — N644 Mastodynia: Secondary | ICD-10-CM

## 2012-08-28 ENCOUNTER — Ambulatory Visit
Admission: RE | Admit: 2012-08-28 | Discharge: 2012-08-28 | Disposition: A | Discharge status: Home / Self Care | Payer: 59 | Source: Ambulatory Visit | Attending: Obstetrics and Gynecology | Admitting: Obstetrics and Gynecology

## 2012-08-28 DIAGNOSIS — N644 Mastodynia: Secondary | ICD-10-CM

## 2012-08-28 DIAGNOSIS — N63 Unspecified lump in unspecified breast: Secondary | ICD-10-CM

## 2012-09-11 ENCOUNTER — Encounter (HOSPITAL_COMMUNITY): Admission: RE | Payer: Self-pay | Source: Ambulatory Visit

## 2012-09-11 SURGERY — MANOMETRY, ESOPHAGUS

## 2012-09-12 NOTE — OR Nursing (Signed)
Spoke with Tresa Endo at Dr. Norval Gable office to let them know that Ms. Ragone did not show up for her scheduled manometry at [facility] yesterday, 2/10.  Called phone number on file for pt, and number was either busy or disconnected multiple times. Ennis Forts, RN

## 2012-09-16 ENCOUNTER — Other Ambulatory Visit: Payer: Self-pay

## 2012-09-28 ENCOUNTER — Ambulatory Visit (HOSPITAL_COMMUNITY): Admission: RE | Admit: 2012-09-28 | Payer: 59 | Source: Ambulatory Visit | Admitting: Gastroenterology

## 2012-10-20 ENCOUNTER — Telehealth: Payer: Self-pay | Admitting: Internal Medicine

## 2012-10-20 MED ORDER — LEVOCETIRIZINE DIHYDROCHLORIDE 5 MG PO TABS: 5.0000 mg | ORAL_TABLET | Freq: Every evening | ORAL | Status: DC

## 2012-10-20 NOTE — Telephone Encounter (Signed)
Refill sent.

## 2012-10-20 NOTE — Telephone Encounter (Signed)
Refill- levocetirizine 5mg  tablets. Take one tablet by mouth every evening. Qty 30 last fill 2.9.14

## 2012-11-21 ENCOUNTER — Encounter: Payer: Self-pay | Admitting: Family

## 2012-11-21 ENCOUNTER — Ambulatory Visit (INDEPENDENT_AMBULATORY_CARE_PROVIDER_SITE_OTHER): Payer: 59 | Admitting: Family

## 2012-11-21 VITALS — BP 114/82 | HR 94 | Temp 98.6°F | Resp 16 | Wt 212.0 lb

## 2012-11-21 DIAGNOSIS — J329 Chronic sinusitis, unspecified: Secondary | ICD-10-CM

## 2012-11-21 MED ORDER — LEVOCETIRIZINE DIHYDROCHLORIDE 5 MG PO TABS: 5.0000 mg | ORAL_TABLET | Freq: Every evening | ORAL | Status: DC

## 2012-11-21 MED ORDER — FLUTICASONE PROPIONATE 50 MCG/ACT NA SUSP: 2.0000 | Freq: Every day | NASAL | Status: DC

## 2012-11-21 MED ORDER — DOXYCYCLINE HYCLATE 100 MG PO TABS: 100.0000 mg | ORAL_TABLET | Freq: Two times a day (BID) | ORAL | Status: DC

## 2012-11-21 NOTE — Progress Notes (Signed)
Subjective:    Patient ID: Christine [name], female    DOB: August 12, 1970, 42 y.o.   MRN: [id number]  HPI  Christine [name] is a 42 yr old female who presents today with chief complaint of runny nose x 3 weeks.   Has hx of chronic allergic rhinitis. Nasal congestion has worsened over the last 4-5 days.  Now has associated headache, facial pain, yellow/green nasal discharge and cough.  Using xyxal and flonase without significant improvement. Has had chills but no documented fever.    Review of Systems See HPI  Past Medical History  Diagnosis Date  . Migraine, unspecified, without mention of intractable migraine without mention of status migrainosus   . Miscarriage 2007  . Diverticulitis     recurrent  . Allergy   . Asthma     Acute asthmatic bronchitis 2011 while pregnant    History   Social History  . Marital Status: Married    Spouse Name: N/A    Number of Children: 2  . Years of Education: N/A   Occupational History  . Not on file.   Social History Main Topics  . Smoking status: Former Games developer  . Smokeless tobacco: Never Used  . Alcohol Use: Yes  . Drug Use: No  . Sexually Active: Not on file   Other Topics Concern  . Not on file   Social History Narrative   Married, 2 daughters   Occupation: Housewife   Daily Caffeine Use   Patient does not get regular exercise.    Past Surgical History  Procedure Laterality Date  . Laparoscopy  5/94    x 2  . Dilation and curettage of uterus      miscarriage  . Sigmoidectomy      Hand-assisted laparoscopic sigmoidectomy with splenic flexure takedown.    Family History  Problem Relation Age of Onset  . Barrett's esophagus Mother   . Diabetes Mother   . Colon polyps Father   . Diabetes Father   . Diabetes Maternal Grandfather   . Colon cancer Neg Hx   . Alcohol abuse Other   . Arthritis Other   . Hyperlipidemia Other   . Hypertension Other   . Stroke Other   . Coronary artery disease Other   . Clotting disorder  Other     Mudlogger  . Crohn's disease Cousin     Allergies  Allergen Reactions  . Hydrocodone   . Penicillins     Current Outpatient Prescriptions on File Prior to Visit  Medication Sig Dispense Refill  . pantoprazole (PROTONIX) 40 MG tablet Take 1 tablet (40 mg total) by mouth 2 (two) times daily.  60 tablet  6  . SYNTHROID 50 MCG tablet Take 1 tablet by mouth daily. 75mg       . hyoscyamine (LEVBID) 0.375 MG 12 hr tablet Take 1 tablet (0.375 mg total) by mouth every 12 (twelve) hours as needed for cramping.  60 tablet  0   No current facility-administered medications on file prior to visit.    BP 114/82  Pulse 94  Temp(Src) 98.6 F (37 C) (Oral)  Resp 16  Wt 212 lb (96.163 kg)  BMI 31.29 kg/m2  SpO2 99%       Objective:   Physical Exam  Constitutional: She is oriented to person, place, and time. She appears well-developed and well-nourished. No distress.  HENT:  Head: Normocephalic and atraumatic.  Right Ear: Tympanic membrane and ear canal normal.  Left Ear: Tympanic membrane and ear  canal normal.  Mouth/Throat: No oropharyngeal exudate, posterior oropharyngeal edema or posterior oropharyngeal erythema.  + frontal and maxillary sinus tenderness to palpation  Cardiovascular: Normal rate and regular rhythm.   No murmur heard. Pulmonary/Chest: Effort normal and breath sounds normal. No respiratory distress. She has no wheezes. She has no rales. She exhibits no tenderness.  Lymphadenopathy:    She has no cervical adenopathy.  Neurological: She is alert and oriented to person, place, and time.  Psychiatric: She has a normal mood and affect. Her behavior is normal. Judgment and thought content normal.          Assessment & Plan:

## 2012-11-21 NOTE — Patient Instructions (Addendum)

## 2012-11-21 NOTE — Assessment & Plan Note (Signed)
Will rx with doxycycline.  Continue xyzal and flonase.

## 2013-02-12 IMAGING — US US BREAST*L*
1 series · 5 of 5 positions shown · non-contrast
Comparison: 05/16/2006

CLINICAL DATA: Asymmetric fullness in the left axilla and 1-week
history of left nipple pain.  Due for annual exam.

DIGITAL DIAGNOSTIC BILATERAL MAMMOGRAM WITH CAD AND LEFT BREAST
ULTRASOUND:

[Series 1: us breast*left* · 5 of 5 slices shown]
[im 1/5]
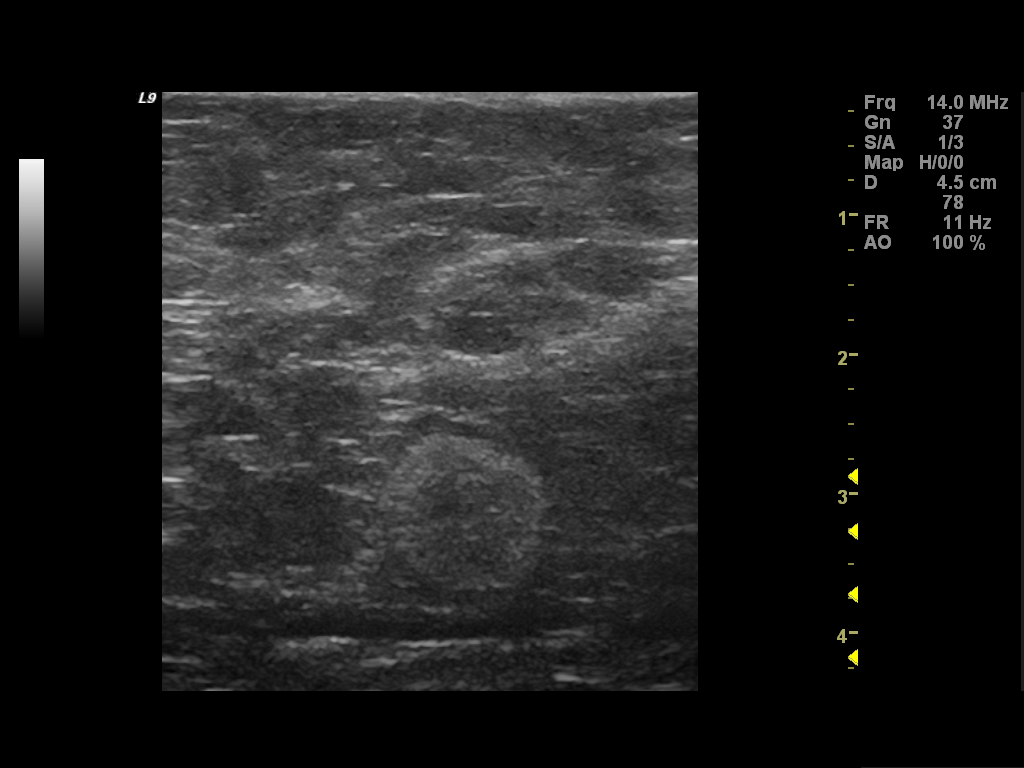
[im 2/5]
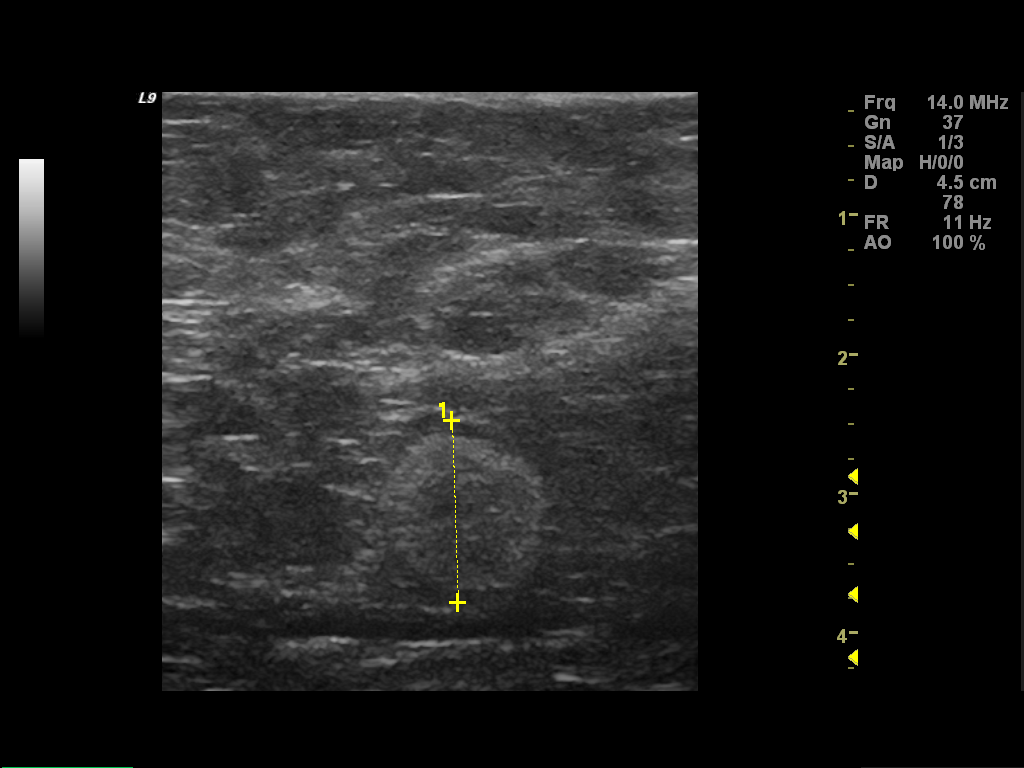
[im 3/5]
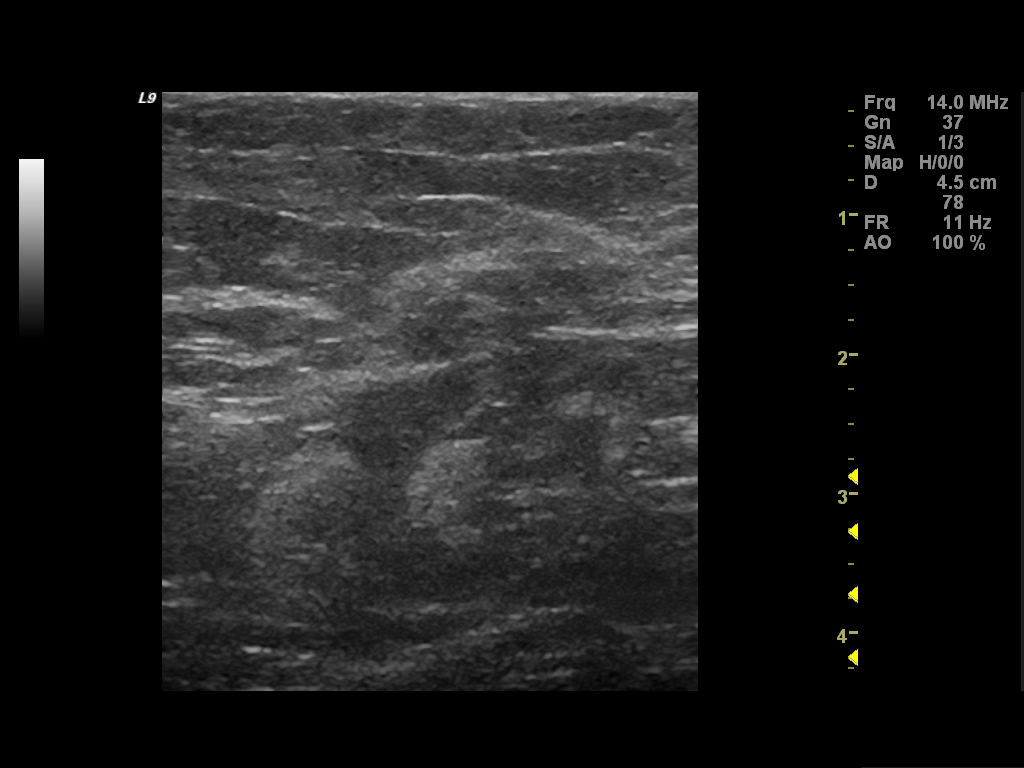
[im 4/5]
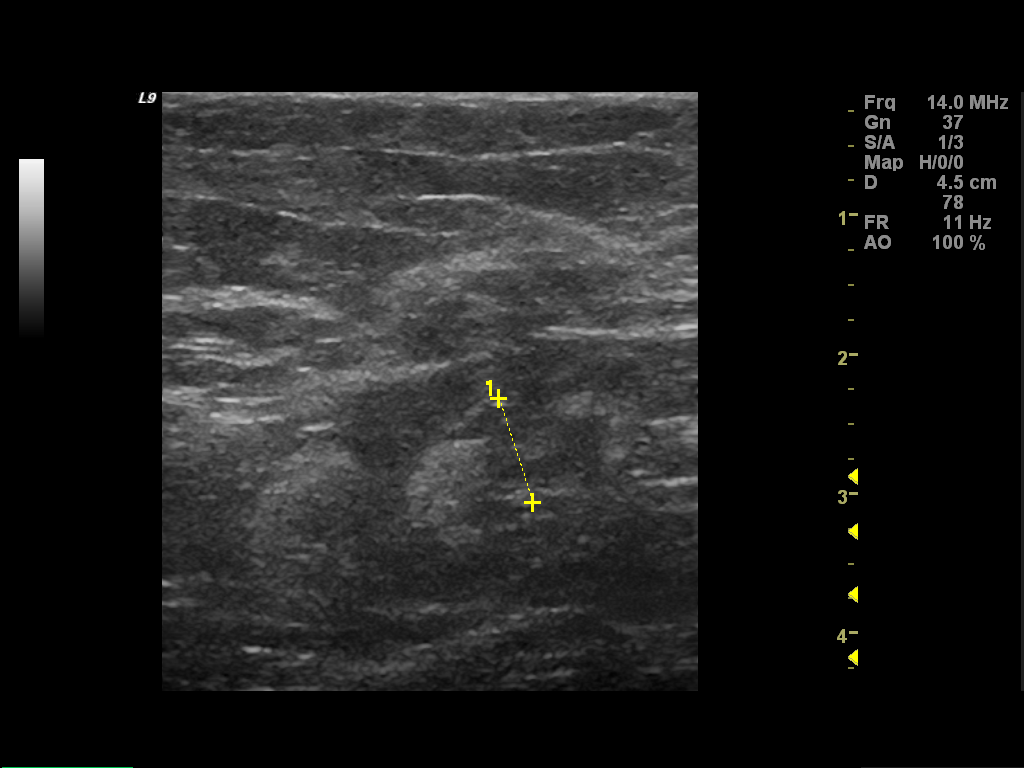
[im 5/5]
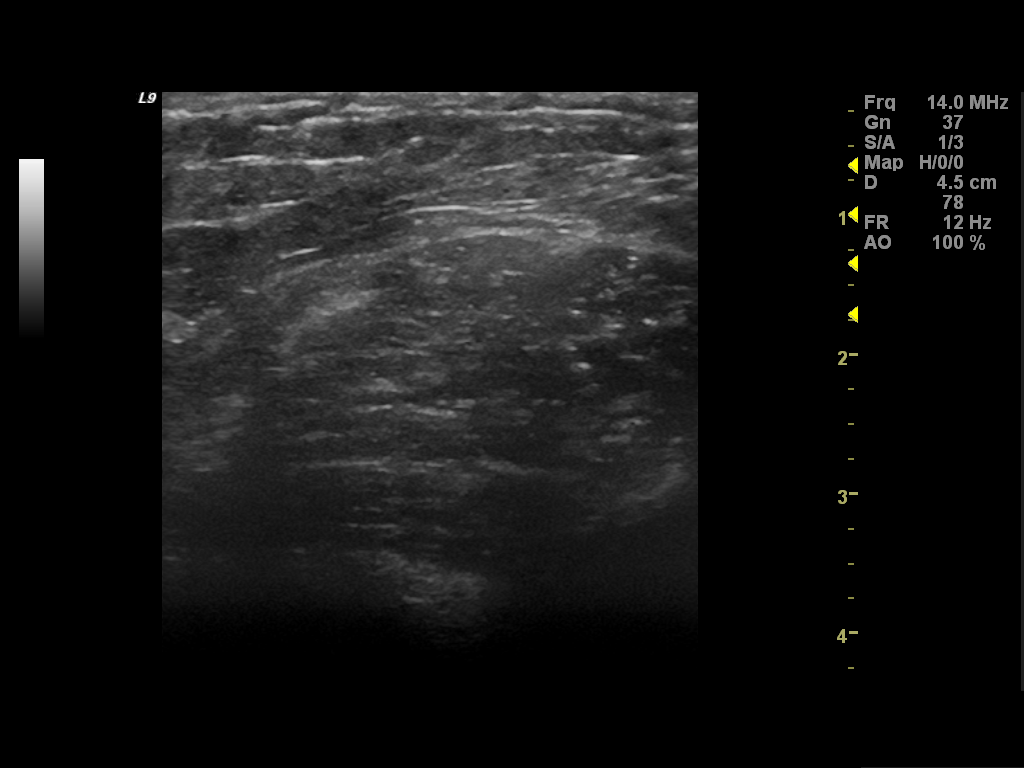

[5 of 5 positions shown; findings below may reference images not displayed]

FINDINGS: ACR Breast Density Category 2: There is a scattered fibroglandular
pattern.

No mass, distortion, or suspicious microcalcification is identified
in either breast to suggest malignancy.

Mammographic images were processed with CAD.

On physical exam, there is soft fullness in the left axilla.  I do
not palpate a mass.  The skin appears normal.

Ultrasound is performed, showing normal axillary lymph nodes with
normal fatty hila.  No lymphadenopathy or mass is identified.  The
area of patient concern corresponds to normal muscle and
subcutaneous fat.
IMPRESSION: No evidence of malignancy in either breast.  No suspicious findings
in the left axilla.

RECOMMENDATION:
Bilateral screening mammogram in 1 year.

I have discussed the findings and recommendations with the patient.
Results were also provided in writing at the conclusion of the
visit.

BI-RADS CATEGORY 1:  Negative.

## 2013-03-26 ENCOUNTER — Encounter: Payer: Self-pay | Admitting: Family

## 2013-03-26 ENCOUNTER — Ambulatory Visit (INDEPENDENT_AMBULATORY_CARE_PROVIDER_SITE_OTHER): Payer: 59 | Admitting: Family

## 2013-03-26 VITALS — BP 120/80 | HR 99 | Temp 98.6°F | Resp 16 | Wt 210.0 lb

## 2013-03-26 DIAGNOSIS — N938 Other specified abnormal uterine and vaginal bleeding: Secondary | ICD-10-CM

## 2013-03-26 DIAGNOSIS — N912 Amenorrhea, unspecified: Secondary | ICD-10-CM

## 2013-03-26 DIAGNOSIS — N926 Irregular menstruation, unspecified: Secondary | ICD-10-CM | POA: Insufficient documentation

## 2013-03-26 DIAGNOSIS — L299 Pruritus, unspecified: Secondary | ICD-10-CM | POA: Insufficient documentation

## 2013-03-26 DIAGNOSIS — N949 Unspecified condition associated with female genital organs and menstrual cycle: Secondary | ICD-10-CM

## 2013-03-26 LAB — POCT URINE HCG BY VISUAL COLOR COMPARISON TESTS: Preg Test, Ur: NEGATIVE

## 2013-03-26 MED ORDER — PREDNISONE 10 MG PO TABS: ORAL_TABLET | ORAL | Status: DC

## 2013-03-26 NOTE — Patient Instructions (Addendum)
Please call if symptoms worsen, or if not improve in 2-3 days. Start prednisone. Continue xyzal, prn benadryl.

## 2013-03-26 NOTE — Progress Notes (Signed)
Subjective:    Patient ID: Christine [name], female    DOB: 1970-11-22, 42 y.o.   MRN: [id number]  HPI  Christine [name] is a 42 yr old female who presents today with chief complaint of itching.  Itching is present "all over" and pt reports that itching is worse at night.  Symptoms started last Saturday night.  She [place]. Denies associated nausea/vomitting.  Denies rash or lesions.  Reports that benadryl helps her symptoms briefly.  She continues xyzal.  Has had previous hx of similar symptoms when she has been around cats. She denies new toiletries.    She reports that she has had skin testing in the past which revealed only environmental allergies. She denies + skin testing for food allergies, though notes that sometimes fruit can cause tingling. She did have a bite of a peach last week which was followed by tongue tingling, but this occurred after the start of her skin itching. She specifically denies associated tongue/lip swelling, sob or wheezing.   She reports that her last menstrual period was 34 days ago.  She generally has 30-31 day cycles.  Review of Systems  See HPI  Past Medical History  Diagnosis Date  . Migraine, unspecified, without mention of intractable migraine without mention of status migrainosus   . Miscarriage 2007  . Diverticulitis     recurrent  . Allergy   . Asthma     Acute asthmatic bronchitis 2011 while pregnant    History   Social History  . Marital Status: Married    Spouse Name: N/A    Number of Children: 2  . Years of Education: N/A   Occupational History  . Not on file.   Social History Main Topics  . Smoking status: Former Games developer  . Smokeless tobacco: Never Used  . Alcohol Use: Yes  . Drug Use: No  . Sexual Activity: Not on file   Other Topics Concern  . Not on file   Social History Narrative   Married, 2 daughters   Occupation: Housewife   Daily Caffeine Use   Patient does not get regular exercise.     Past Surgical History  Procedure Laterality Date  . Laparoscopy  5/94    x 2  . Dilation and curettage of uterus      miscarriage  . Sigmoidectomy      Hand-assisted laparoscopic sigmoidectomy with splenic flexure takedown.    Family History  Problem Relation Age of Onset  . Barrett's esophagus Mother   . Diabetes Mother   . Colon polyps Father   . Diabetes Father   . Diabetes Maternal Grandfather   . Colon cancer Neg Hx   . Alcohol abuse Other   . Arthritis Other   . Hyperlipidemia Other   . Hypertension Other   . Stroke Other   . Coronary artery disease Other   . Clotting disorder Other     Mudlogger  . Crohn's disease Cousin     Allergies  Allergen Reactions  . Hydrocodone   . Penicillins     Current Outpatient Prescriptions on File Prior to Visit  Medication Sig Dispense Refill  . fluticasone (FLONASE) 50 MCG/ACT nasal spray Place 2 sprays into the nose daily.  16 g  6  . levocetirizine (XYZAL) 5 MG tablet Take 1 tablet (5 mg total) by mouth every evening. preauth if necessary. Has failed multiple other antihistamines for >30days  30 tablet  5  . pantoprazole (  PROTONIX) 40 MG tablet Take 1 tablet (40 mg total) by mouth 2 (two) times daily.  60 tablet  6   No current facility-administered medications on file prior to visit.    BP 120/80  Pulse 99  Temp(Src) 98.6 F (37 C) (Oral)  Resp 16  Wt 210 lb 0.6 oz (95.274 kg)  BMI 31 kg/m2  SpO2 99%  LMP 02/21/2013       Objective:   Physical Exam  Constitutional: She is oriented to person, place, and time. She appears well-developed and well-nourished.  HENT:  Head: Normocephalic and atraumatic.  Cardiovascular: Normal rate and regular rhythm.   No murmur heard. Pulmonary/Chest: Effort normal and breath sounds normal. No respiratory distress. She has no wheezes. She has no rales. She exhibits no tenderness.  Musculoskeletal: She exhibits no edema.  Neurological: She is alert and oriented to  person, place, and time.  Skin: Skin is warm and dry.  No rash noted.    Psychiatric: She has a normal mood and affect. Her behavior is normal. Judgment and thought content normal.          Assessment & Plan:

## 2013-03-26 NOTE — Assessment & Plan Note (Signed)
Urine HCG - negative.

## 2013-03-26 NOTE — Assessment & Plan Note (Signed)
No rash, improving.  Continue xyzal and benadryl prn. Add short course of prednisone.  If rash or worsening symptoms, consider referral to allergist.

## 2013-04-13 ENCOUNTER — Ambulatory Visit (INDEPENDENT_AMBULATORY_CARE_PROVIDER_SITE_OTHER): Payer: 59 | Admitting: Family

## 2013-04-13 ENCOUNTER — Encounter: Payer: Self-pay | Admitting: Family

## 2013-04-13 VITALS — BP 126/78 | HR 90 | Temp 98.1°F | Resp 16 | Wt 212.0 lb

## 2013-04-13 DIAGNOSIS — J329 Chronic sinusitis, unspecified: Secondary | ICD-10-CM

## 2013-04-13 MED ORDER — LEVOFLOXACIN 500 MG PO TABS: 500.0000 mg | ORAL_TABLET | Freq: Every day | ORAL | Status: DC

## 2013-04-13 NOTE — Patient Instructions (Addendum)
Call if symptoms worsen or if not improved in 2-3 days.  

## 2013-04-13 NOTE — Assessment & Plan Note (Signed)
Pen allergic, will rx with levaquin as she has tolerated this in the past.

## 2013-04-13 NOTE — Progress Notes (Signed)
Subjective:    Patient ID: Christine [name], female    DOB: [birth year], 42 y.o.   MRN: [id number]  HPI  Cough sore throat, sneezing x 1 week.  Concerned re: sinus infection. Will be leaving to get out of town on Sunday. Denies associated fever.  Reports nasal congestion white to green in color. Has started flonase x 1 week which has helped with the post nasal drip. Energy is poor.    Review of Systems See HPI  Past Medical History  Diagnosis Date  . Migraine, unspecified, without mention of intractable migraine without mention of status migrainosus   . Miscarriage 2007  . Diverticulitis     recurrent  . Allergy   . Asthma     Acute asthmatic bronchitis 2011 while pregnant    History   Social History  . Marital Status: Married    Spouse Name: N/A    Number of Children: 2  . Years of Education: N/A   Occupational History  . Not on file.   Social History Main Topics  . Smoking status: Former Games developer  . Smokeless tobacco: Never Used  . Alcohol Use: Yes  . Drug Use: No  . Sexual Activity: Not on file   Other Topics Concern  . Not on file   Social History Narrative   Married, 2 daughters   Occupation: Housewife   Daily Caffeine Use   Patient does not get regular exercise.    Past Surgical History  Procedure Laterality Date  . Laparoscopy  5/94    x 2  . Dilation and curettage of uterus      miscarriage  . Sigmoidectomy      Hand-assisted laparoscopic sigmoidectomy with splenic flexure takedown.    Family History  Problem Relation Age of Onset  . Barrett's esophagus Mother   . Diabetes Mother   . Colon polyps Father   . Diabetes Father   . Diabetes Maternal Grandfather   . Colon cancer Neg Hx   . Alcohol abuse Other   . Arthritis Other   . Hyperlipidemia Other   . Hypertension Other   . Stroke Other   . Coronary artery disease Other   . Clotting disorder Other     Mudlogger  . Crohn's disease Cousin     Allergies  Allergen Reactions   . Hydrocodone   . Penicillins     Current Outpatient Prescriptions on File Prior to Visit  Medication Sig Dispense Refill  . fluticasone (FLONASE) 50 MCG/ACT nasal spray Place 2 sprays into the nose daily.  16 g  6  . levocetirizine (XYZAL) 5 MG tablet Take 1 tablet (5 mg total) by mouth every evening. preauth if necessary. Has failed multiple other antihistamines for >30days  30 tablet  5  . levothyroxine (SYNTHROID) 100 MCG tablet Take 100 mcg by mouth daily before breakfast.      . pantoprazole (PROTONIX) 40 MG tablet Take 1 tablet (40 mg total) by mouth 2 (two) times daily.  60 tablet  6  . predniSONE (DELTASONE) 10 MG tablet 4 tabs once daily for 4 days.  16 tablet  0   No current facility-administered medications on file prior to visit.    BP 126/78  Pulse 90  Temp(Src) 98.1 F (36.7 C) (Oral)  Resp 16  Wt 212 lb 0.6 oz (96.181 kg)  BMI 31.3 kg/m2  SpO2 99%  LMP 03/28/2013       Objective:   Physical Exam  Constitutional: She is oriented  to person, place, and time. She appears well-developed and well-nourished. No distress.  HENT:  Head: Normocephalic and atraumatic.  Right Ear: Tympanic membrane and ear canal normal.  Left Ear: Tympanic membrane and ear canal normal.  Mouth/Throat: No oropharyngeal exudate or posterior oropharyngeal edema.  + frontal and maxillary sinus tenderness to palpation.     Cardiovascular: Normal rate and regular rhythm.   No murmur heard. Pulmonary/Chest: Effort normal and breath sounds normal. No respiratory distress. She has no wheezes. She has no rales. She exhibits no tenderness.  Neurological: She is alert and oriented to person, place, and time.  Psychiatric: She has a normal mood and affect. Her behavior is normal. Judgment and thought content normal.          Assessment & Plan:

## 2013-06-07 ENCOUNTER — Other Ambulatory Visit: Payer: Self-pay

## 2013-06-20 ENCOUNTER — Encounter: Payer: Self-pay | Admitting: Physician Assistant

## 2013-06-20 ENCOUNTER — Ambulatory Visit (INDEPENDENT_AMBULATORY_CARE_PROVIDER_SITE_OTHER): Payer: 59 | Admitting: Physician Assistant

## 2013-06-20 VITALS — BP 124/78 | HR 88 | Temp 98.4°F | Ht 69.0 in | Wt 216.0 lb

## 2013-06-20 DIAGNOSIS — Z23 Encounter for immunization: Secondary | ICD-10-CM

## 2013-06-20 DIAGNOSIS — K219 Gastro-esophageal reflux disease without esophagitis: Secondary | ICD-10-CM

## 2013-06-20 DIAGNOSIS — J209 Acute bronchitis, unspecified: Secondary | ICD-10-CM

## 2013-06-20 MED ORDER — AZITHROMYCIN 250 MG PO TABS: ORAL_TABLET | ORAL | Status: DC

## 2013-06-20 NOTE — Assessment & Plan Note (Signed)
Resume protonix BID as prescribed

## 2013-06-20 NOTE — Patient Instructions (Signed)
Please take antibiotic as prescribed.  Increase fluid intake.  Saline nasal spray.  Continue allergy medications.  Increase Protonix back tow twice daily.  Call or return if symptoms not improving.

## 2013-06-20 NOTE — Assessment & Plan Note (Signed)
Rx Azithromycin.  Increase fluids.  Saline nasal spray.  Rest.  Continue Flonase and Xyzal.  Humidifier in bedroom.  Call if symptoms are not improving as re-evaluation and possible CXR may be warranted.

## 2013-06-20 NOTE — Progress Notes (Signed)
Patient ID: Christine [name], female   DOB: 01/02/1971, 42 y.o.   MRN: [id number]  Patient presents to clinic today c/o cough that was initially dry but now productive of green sputum x 3 weeks.  Patient denies fever, chills, nausea, sinus pressure or pain.  Denies shortness of breath but states she had mild wheezing with onset of symptoms that has since cleared.  Patient  Denies asthma but endorses significant allergies.  Has been taking flonase and xyzal.  Patient also with history of GERD requiring BID dosing of Protonix.  States she has been taking medication QD.  Patient endorses occasional heartburn symptoms.  Is followed by GI and has had EGD in the past.   Past Medical History  Diagnosis Date  . Migraine, unspecified, without mention of intractable migraine without mention of status migrainosus   . Miscarriage 2007  . Diverticulitis     recurrent  . Allergy   . Asthma     Acute asthmatic bronchitis 2011 while pregnant    Current Outpatient Prescriptions on File Prior to Visit  Medication Sig Dispense Refill  . fluticasone (FLONASE) 50 MCG/ACT nasal spray Place 2 sprays into the nose daily.  16 g  6  . levothyroxine (SYNTHROID) 100 MCG tablet Take 100 mcg by mouth daily before breakfast.      . pantoprazole (PROTONIX) 40 MG tablet Take 1 tablet (40 mg total) by mouth 2 (two) times daily.  60 tablet  6  . levocetirizine (XYZAL) 5 MG tablet Take 1 tablet (5 mg total) by mouth every evening. preauth if necessary. Has failed multiple other antihistamines for >30days  30 tablet  5  . predniSONE (DELTASONE) 10 MG tablet 4 tabs once daily for 4 days.  16 tablet  0   No current facility-administered medications on file prior to visit.    Allergies  Allergen Reactions  . Hydrocodone   . Penicillins     Family History  Problem Relation Age of Onset  . Barrett's esophagus Mother   . Diabetes Mother   . Colon polyps Father   . Diabetes Father   . Diabetes Maternal Grandfather   .  Colon cancer Neg Hx   . Alcohol abuse Other   . Arthritis Other   . Hyperlipidemia Other   . Hypertension Other   . Stroke Other   . Coronary artery disease Other   . Clotting disorder Other     Mudlogger  . Crohn's disease Cousin     History   Social History  . Marital Status: Married    Spouse Name: N/A    Number of Children: 2  . Years of Education: N/A   Social History Main Topics  . Smoking status: Former Games developer  . Smokeless tobacco: Never Used  . Alcohol Use: Yes  . Drug Use: No  . Sexual Activity: None   Other Topics Concern  . None   Social History Narrative   Married, 2 daughters   Occupation: Housewife   Daily Caffeine Use   Patient does not get regular exercise.   ROS See HPI.  All other ROS are negative.  Filed Vitals:   06/20/13 1117  BP: 124/78  Pulse: 88  Temp: 98.4 F (36.9 C)   Physical Exam  Vitals reviewed. Constitutional: She is oriented to person, place, and time and well-developed, well-nourished, and in no distress.  HENT:  Head: Normocephalic and atraumatic.  Right Ear: External ear normal.  Left Ear: External ear normal.  Nose: Nose  normal.  Mouth/Throat: Oropharynx is clear and moist. No oropharyngeal exudate.  Tympanic membranes within normal limits bilaterally.  Eyes: Conjunctivae and EOM are normal. Pupils are equal, round, and reactive to light.  Neck: Neck supple.  Cardiovascular: Normal rate, regular rhythm, normal heart sounds and intact distal pulses.   Pulmonary/Chest: Effort normal and breath sounds normal. No respiratory distress. She has no wheezes. She has no rales. She exhibits no tenderness.  Lymphadenopathy:    She has no cervical adenopathy.  Neurological: She is alert and oriented to person, place, and time.  Skin: Skin is warm and dry. No rash noted.  Psychiatric: Affect normal.    Recent Results (from the past 2160 hour(s))  POCT URINE HCG BY VISUAL COLOR COMPARISON TESTS     Status: None    Collection Time    03/26/13  2:07 PM      Result Value Range   Preg Test, Ur Negative      Assessment/Plan: GASTROESOPHAGEAL REFLUX DISEASE Resume protonix BID as prescribed  Acute bronchitis Rx Azithromycin.  Increase fluids.  Saline nasal spray.  Rest.  Continue Flonase and Xyzal.  Humidifier in bedroom.  Call if symptoms are not improving as re-evaluation and possible CXR may be warranted.

## 2013-06-20 NOTE — Progress Notes (Signed)
Pre-visit discussion using our clinic review tool. No additional management support is needed unless otherwise documented below in the visit note.  

## 2013-08-14 ENCOUNTER — Other Ambulatory Visit: Payer: Self-pay | Admitting: Family

## 2013-09-04 ENCOUNTER — Other Ambulatory Visit: Payer: Self-pay | Admitting: Gastroenterology

## 2013-09-17 ENCOUNTER — Telehealth: Payer: Self-pay | Admitting: Family

## 2013-09-17 DIAGNOSIS — K219 Gastro-esophageal reflux disease without esophagitis: Secondary | ICD-10-CM

## 2013-09-17 DIAGNOSIS — R1013 Epigastric pain: Secondary | ICD-10-CM

## 2013-09-17 MED ORDER — PANTOPRAZOLE SODIUM 40 MG PO TBEC: 40.0000 mg | DELAYED_RELEASE_TABLET | Freq: Two times a day (BID) | ORAL | Status: DC

## 2013-09-17 NOTE — Telephone Encounter (Signed)
Refill pantoprazole

## 2013-11-14 ENCOUNTER — Other Ambulatory Visit: Payer: Self-pay | Admitting: Family

## 2013-11-15 NOTE — Telephone Encounter (Signed)
Rx request to pharmacy, #[id number]; PATIENT DUE FOR FOLLOW-UP OFFICE VISIT/SLS

## 2013-12-13 ENCOUNTER — Other Ambulatory Visit: Payer: Self-pay | Admitting: Family

## 2013-12-13 NOTE — Telephone Encounter (Signed)
Rx request to pharmacy; Office Visit Needed Prior to Future Refills/SLS

## 2014-01-01 ENCOUNTER — Ambulatory Visit (INDEPENDENT_AMBULATORY_CARE_PROVIDER_SITE_OTHER): Payer: 59 | Admitting: Physician Assistant

## 2014-01-01 ENCOUNTER — Encounter: Payer: Self-pay | Admitting: Physician Assistant

## 2014-01-01 ENCOUNTER — Other Ambulatory Visit (HOSPITAL_BASED_OUTPATIENT_CLINIC_OR_DEPARTMENT_OTHER): Payer: 59

## 2014-01-01 ENCOUNTER — Ambulatory Visit (HOSPITAL_BASED_OUTPATIENT_CLINIC_OR_DEPARTMENT_OTHER)
Admission: RE | Admit: 2014-01-01 | Discharge: 2014-01-01 | Disposition: A | Discharge status: Home / Self Care | Payer: 59 | Source: Ambulatory Visit | Attending: Physician Assistant | Admitting: Physician Assistant

## 2014-01-01 VITALS — BP 130/84 | HR 81 | Temp 99.0°F | Ht 69.0 in | Wt 213.0 lb

## 2014-01-01 DIAGNOSIS — R10816 Epigastric abdominal tenderness: Secondary | ICD-10-CM

## 2014-01-01 DIAGNOSIS — K219 Gastro-esophageal reflux disease without esophagitis: Secondary | ICD-10-CM

## 2014-01-01 LAB — CBC
HCT: 39 % (ref 36.0–46.0)
Hemoglobin: 13.2 g/dL (ref 12.0–15.0)
MCH: 28.8 pg (ref 26.0–34.0)
MCHC: 33.8 g/dL (ref 30.0–36.0)
MCV: 85 fL (ref 78.0–100.0)
Platelets: 253 10*3/uL (ref 150–400)
RBC: 4.59 MIL/uL (ref 3.87–5.11)
RDW: 14.5 % (ref 11.5–15.5)
WBC: 7.7 10*3/uL (ref 4.0–10.5)

## 2014-01-01 MED ORDER — OMEPRAZOLE 20 MG PO CPDR: 20.0000 mg | DELAYED_RELEASE_CAPSULE | Freq: Every day | ORAL | Status: DC

## 2014-01-01 NOTE — Assessment & Plan Note (Signed)
With mild epigastric tenderness. Protonix too expensive. Will Rx and that resolved. Patient instructed to take 20 mg twice daily for one week. Then decrease to 20 mg daily. Monitor diet. Trigger foods discussed. We'll obtain labs to include CBC, CMP, lipase and H. pylori IgG. We'll also obtain abdominal ultrasound. Patient instructed that if symptoms acutely worsen, she is to proceed to the emergency department. Patient voices understanding.

## 2014-01-01 NOTE — Patient Instructions (Signed)
Please take Omeprazole -- 20 mg twice daily for one week. Then decrease to 20 mg daily.   Avoid late-night eating. Take two TUMS before bed.  Elevate the head of your bed.  I will call you with your lab results.  I will also call you with your ultrasound results once they are in.

## 2014-01-01 NOTE — Progress Notes (Signed)
Pre visit review using our clinic review tool, if applicable. No additional management support is needed unless otherwise documented below in the visit note. 

## 2014-01-01 NOTE — Progress Notes (Signed)
Patient presents to clinic today c/o heartburn, acid reflux and epigastric tenderness that has been present for several weeks. Patient denies nausea, vomiting, fever, chills. Patient has endorses some loose stools, but has a history of irritable bowel syndrome. Patient also history significant GERD, previously on Protonix although has not taken in several months due to cost.  Patient states the heartburn is constant. Abdominal pain is not coinciding with food or range of motion. Patient denies recent increased alcohol consumption her NSAID use.  Does endorse pain occasionally radiates to her right upper quadrant. Denies tenesmus, melena or hematochezia. Patient had EGD in 2013, which was unremarkable for erosive esophagitis.  Past Medical History  Diagnosis Date  . Migraine, unspecified, without mention of intractable migraine without mention of status migrainosus   . Miscarriage 2007  . Diverticulitis     recurrent  . Allergy   . Asthma     Acute asthmatic bronchitis 2011 while pregnant    Current Outpatient Prescriptions on File Prior to Visit  Medication Sig Dispense Refill  . fluticasone (FLONASE) 50 MCG/ACT nasal spray Place 2 sprays into the nose daily.  16 g  6  . levocetirizine (XYZAL) 5 MG tablet TAKE 1 TABLET BY MOUTH EVERY EVENING  30 tablet  0  . levothyroxine (SYNTHROID) 100 MCG tablet Take 100 mcg by mouth daily before breakfast.       No current facility-administered medications on file prior to visit.    Allergies  Allergen Reactions  . Hydrocodone   . Penicillins     Family History  Problem Relation Age of Onset  . Barrett's esophagus Mother   . Diabetes Mother   . Colon polyps Father   . Diabetes Father   . Diabetes Maternal Grandfather   . Colon cancer Neg Hx   . Alcohol abuse Other   . Arthritis Other   . Hyperlipidemia Other   . Hypertension Other   . Stroke Other   . Coronary artery disease Other   . Clotting disorder Other     Government social research officer  .  Crohn's disease Cousin     History   Social History  . Marital Status: Married    Spouse Name: N/A    Number of Children: 2  . Years of Education: N/A   Social History Main Topics  . Smoking status: Former Research scientist (life sciences)  . Smokeless tobacco: Never Used  . Alcohol Use: Yes  . Drug Use: No  . Sexual Activity: None   Other Topics Concern  . None   Social History Narrative   Married, 2 daughters   Occupation: Housewife   Daily Caffeine Use   Patient does not get regular exercise.   Review of Systems - See HPI.  All other ROS are negative.  BP 130/84  Pulse 81  Temp(Src) 99 F (37.2 C) (Oral)  Ht 5\' 9"  (1.753 m)  Wt 213 lb (96.616 kg)  BMI 31.44 kg/m2  SpO2 97%  Physical Exam  Vitals reviewed. Constitutional: She is oriented to person, place, and time and well-developed, well-nourished, and in no distress.  HENT:  Head: Normocephalic and atraumatic.  Right Ear: External ear normal.  Left Ear: External ear normal.  Nose: Nose normal.  Mouth/Throat: Oropharynx is clear and moist. No oropharyngeal exudate.  Eyes: Conjunctivae are normal. Pupils are equal, round, and reactive to light.  Neck: Neck supple.  Cardiovascular: Normal rate, regular rhythm, normal heart sounds and intact distal pulses.   Pulmonary/Chest: Effort normal and breath sounds normal. No  respiratory distress. She has no wheezes. She has no rales. She exhibits no tenderness.  Abdominal: Soft. Bowel sounds are normal. She exhibits no distension and no mass. There is no rebound and no guarding.  Positive epigastric tenderness. Negative Murphy's sign.  Lymphadenopathy:    She has no cervical adenopathy.  Neurological: She is alert and oriented to person, place, and time.  Skin: Skin is warm and dry. No rash noted.  Psychiatric: Affect normal.   Assessment/Plan: GERD (gastroesophageal reflux disease) With mild epigastric tenderness. Protonix too expensive. Will Rx and that resolved. Patient instructed to  take 20 mg twice daily for one week. Then decrease to 20 mg daily. Monitor diet. Trigger foods discussed. We'll obtain labs to include CBC, CMP, lipase and H. pylori IgG. We'll also obtain abdominal ultrasound. Patient instructed that if symptoms acutely worsen, she is to proceed to the emergency department. Patient voices understanding.

## 2014-01-02 ENCOUNTER — Telehealth: Payer: Self-pay | Admitting: Family

## 2014-01-02 ENCOUNTER — Telehealth: Payer: Self-pay | Admitting: Physician Assistant

## 2014-01-02 DIAGNOSIS — K8020 Calculus of gallbladder without cholecystitis without obstruction: Secondary | ICD-10-CM

## 2014-01-02 LAB — COMPREHENSIVE METABOLIC PANEL
ALT: 27 U/L (ref 0–35)
AST: 21 U/L (ref 0–37)
Albumin: 4.3 g/dL (ref 3.5–5.2)
Alkaline Phosphatase: 66 U/L (ref 39–117)
BUN: 8 mg/dL (ref 6–23)
CO2: 26 meq/L (ref 19–32)
Calcium: 9.3 mg/dL (ref 8.4–10.5)
Chloride: 102 mEq/L (ref 96–112)
Creat: 0.68 mg/dL (ref 0.50–1.10)
Glucose, Bld: 79 mg/dL (ref 70–99)
Potassium: 4 mEq/L (ref 3.5–5.3)
Sodium: 138 mEq/L (ref 135–145)
TOTAL PROTEIN: 7.2 g/dL (ref 6.0–8.3)
Total Bilirubin: 0.3 mg/dL (ref 0.2–1.2)

## 2014-01-02 LAB — H. PYLORI ANTIBODY, IGG: H Pylori IgG: 0.57 {ISR}

## 2014-01-02 LAB — LIPASE: LIPASE: 19 U/L (ref 0–75)

## 2014-01-02 MED ORDER — TRAMADOL HCL 50 MG PO TABS: 50.0000 mg | ORAL_TABLET | Freq: Three times a day (TID) | ORAL | Status: DC | PRN

## 2014-01-02 MED ORDER — ONDANSETRON 8 MG PO TBDP: 8.0000 mg | ORAL_TABLET | Freq: Three times a day (TID) | ORAL | Status: DC | PRN

## 2014-01-02 NOTE — Telephone Encounter (Signed)
Received PA paperwork for ondansetron odt, forward to nurse

## 2014-01-02 NOTE — Telephone Encounter (Signed)
Labs and imaging have resulted. Her labs look good, no evidence of damage to kidneys, liver, pancreas or gallbladder.  H. Pylori is pending.  Her US reveals gallbladder sludge and gallstones.  I feel she has a combination of things contributing to her symptoms -- gallstones and acid reflux.  For the gallstones, I am setting her up to see a general surgeon for them to decide if gallbladder needs to be removed. I am sending her a prescription for zofran in cause severe nausea develops.  She is to avoid heavy or fried/greasy foods.  Eat a bland diet and stay well-hydrated.  I want to give her something for pain but see she is allergic to Hydrocodone.  Has she ever taken Tramadol?  If her symptoms acutely worsen before she can see specialist, she is to proceed directly to the ER.

## 2014-01-02 NOTE — Telephone Encounter (Signed)
Form completed and forwarded to Provider for signature. 

## 2014-01-02 NOTE — Telephone Encounter (Signed)
Patient informed, understood & agreed; Tramadol Rx print & faxed to pharmacy, pt has pain to right of umbilical today, informed if no relief with new pain medication to call office for me/SLS

## 2014-01-04 MED ORDER — ONDANSETRON HCL 4 MG PO TABS: 4.0000 mg | ORAL_TABLET | Freq: Three times a day (TID) | ORAL | Status: DC | PRN

## 2014-01-04 NOTE — Telephone Encounter (Signed)
PA denied due to quantity limits. Will try to send in zofran 4mg  tablets.  Can we call pharmacy to verify prescription is acceptable and does not require PA?

## 2014-01-04 NOTE — Telephone Encounter (Signed)
Per pharmacist, quantity limit is #12/30 days. He states he will dispense Rx for #12 instead of 20 that was sent previously; no need to send another Rx. Notified pt and she states she picked up rx yesterday. States appt with surgeon is 01/15/14 and they will call her if there is a cancellation.

## 2014-01-07 ENCOUNTER — Other Ambulatory Visit: Payer: Self-pay | Admitting: Family

## 2014-01-16 ENCOUNTER — Ambulatory Visit (INDEPENDENT_AMBULATORY_CARE_PROVIDER_SITE_OTHER): Payer: 59 | Admitting: General Surgery

## 2014-01-30 ENCOUNTER — Encounter (INDEPENDENT_AMBULATORY_CARE_PROVIDER_SITE_OTHER): Payer: Self-pay | Admitting: General Surgery

## 2014-01-30 ENCOUNTER — Ambulatory Visit (INDEPENDENT_AMBULATORY_CARE_PROVIDER_SITE_OTHER): Payer: 59 | Admitting: General Surgery

## 2014-01-30 VITALS — BP 134/78 | HR 68 | Temp 97.5°F | Ht 69.0 in | Wt 213.0 lb

## 2014-01-30 DIAGNOSIS — K8020 Calculus of gallbladder without cholecystitis without obstruction: Secondary | ICD-10-CM

## 2014-01-30 NOTE — Progress Notes (Signed)
Patient ID: Christine [name], female   DOB: [birth year], 43 y.o.   MRN: [id number]  Chief Complaint  Patient presents with  . eval gallbaldder    HPI Christine [name] is a 43 y.o. female.  The patient is a 43 year old female who is referred by Dr. Daun Peacock for evaluation of stenotic cholelithiasis. History she's had numerous episodes right upper quadrant pain that radiated to her back. Patient's undergone ultrasound reveals gallstones.  Of note the patient's had previous laparoscopic ovarian and hand-assisted sigmoid colectomy for diverticulitis per Dr. Barry Dienes in 2012. HPI  Past Medical History  Diagnosis Date  . Migraine, unspecified, without mention of intractable migraine without mention of status migrainosus   . Miscarriage 2007  . Diverticulitis     recurrent  . Allergy   . Asthma     Acute asthmatic bronchitis 2011 while pregnant  . GERD (gastroesophageal reflux disease)   . Thyroid disease     Past Surgical History  Procedure Laterality Date  . Laparoscopy  5/94    x 2  . Dilation and curettage of uterus      miscarriage  . Sigmoidectomy      Hand-assisted laparoscopic sigmoidectomy with splenic flexure takedown.    Family History  Problem Relation Age of Onset  . Barrett's esophagus Mother   . Diabetes Mother   . Colon polyps Father   . Diabetes Father   . Diabetes Maternal Grandfather   . Colon cancer Neg Hx   . Alcohol abuse Other   . Arthritis Other   . Hyperlipidemia Other   . Hypertension Other   . Stroke Other   . Coronary artery disease Other   . Clotting disorder Other     Government social research officer  . Crohn's disease Cousin     Social History History  Substance Use Topics  . Smoking status: Former Smoker    Quit date: 01/31/2000  . Smokeless tobacco: Never Used  . Alcohol Use: Yes    Allergies  Allergen Reactions  . Hydrocodone   . Penicillins     Current Outpatient Prescriptions  Medication Sig Dispense Refill  . fluticasone (FLONASE)  50 MCG/ACT nasal spray Place 2 sprays into the nose daily.  16 g  6  . levocetirizine (XYZAL) 5 MG tablet TAKE 1 TABLET BY MOUTH EVERY EVENING  30 tablet  0  . levothyroxine (SYNTHROID) 100 MCG tablet Take 100 mcg by mouth daily before breakfast.      . omeprazole (PRILOSEC) 20 MG capsule Take 1 capsule (20 mg total) by mouth daily.  30 capsule  3  . ondansetron (ZOFRAN) 4 MG tablet Take 1 tablet (4 mg total) by mouth every 8 (eight) hours as needed for nausea or vomiting.  20 tablet  0  . traMADol (ULTRAM) 50 MG tablet Take 1 tablet (50 mg total) by mouth every 8 (eight) hours as needed for moderate pain.  30 tablet  0   No current facility-administered medications for this visit.    Review of Systems Review of Systems  Constitutional: Negative.   HENT: Negative.   Respiratory: Negative.   Cardiovascular: Negative.   Gastrointestinal: Positive for nausea and abdominal pain.  Neurological: Negative.   All other systems reviewed and are negative.   Blood pressure 134/78, pulse 68, temperature 97.5 F (36.4 C), height 5\' 9"  (1.753 m), weight 213 lb (96.616 kg).  Physical Exam Physical Exam  Constitutional: She is oriented to person, place, and time. She appears well-developed and well-nourished.  HENT:  Head: Normocephalic and atraumatic.  Eyes: Conjunctivae and EOM are normal. Pupils are equal, round, and reactive to light.  Neck: Normal range of motion. Neck supple.  Cardiovascular: Normal rate, regular rhythm and normal heart sounds.   Pulmonary/Chest: Effort normal and breath sounds normal.  Abdominal: Soft. Bowel sounds are normal. She exhibits no distension. There is no tenderness. There is no rebound and no guarding.  Musculoskeletal: Normal range of motion.  Neurological: She is alert and oriented to person, place, and time.  Skin: Skin is warm and dry.  Psychiatric: She has a normal mood and affect.    Data Reviewed Ultrasound reveals multiple small  gallstones  Assessment    43 year old female with cholelithiasis     Plan    1. We'll proceed to the operative for laparoscopic cholecystectomy 2.All risks and benefits were discussed with the patient to generally include: infection, bleeding, possible need for post op ERCP, damage to the bile ducts, and bile leak. Alternatives were offered and described.  All questions were answered and the patient voiced understanding of the procedure and wishes to proceed at this point with a laparoscopic cholecystectomy         Rosario Jacks., [facility] 01/30/2014, 10:54 AM

## 2014-02-06 ENCOUNTER — Other Ambulatory Visit: Payer: Self-pay | Admitting: Family

## 2014-02-28 ENCOUNTER — Telehealth (INDEPENDENT_AMBULATORY_CARE_PROVIDER_SITE_OTHER): Payer: Self-pay | Admitting: General Surgery

## 2014-02-28 NOTE — Telephone Encounter (Signed)
Called patient to schedule surgery, patient never called back.

## 2014-03-11 ENCOUNTER — Other Ambulatory Visit (INDEPENDENT_AMBULATORY_CARE_PROVIDER_SITE_OTHER): Payer: Self-pay | Admitting: General Surgery

## 2014-03-19 NOTE — Pre-Procedure Instructions (Addendum)
Christine [name]  03/19/2014   Your procedure is scheduled on:  Monday, August 24.  Report to [facility] Admitting at 10:00 AM.  Call this number if you have problems the morning of surgery: [phone]   Remember:   Do not eat food or drink liquids after midnight Sunday, August 23.   Take these medicines the morning of surgery with A SIP OF WATER: escitalopram (LEXAPRO), levothyroxine (SYNTHROID), omeprazole (PRILOSEC).    Do not wear jewelry, make-up or nail polish.  Do not wear lotions, powders, or perfumes.   Do not shave 48 hours prior to surgery.  Do not bring valuables to the hospital.              [facility] is not responsible for any belongings or valuables.               Contacts, dentures or bridgework may not be worn into surgery.  Leave suitcase in the car. After surgery it may be brought to your room.  For patients admitted to the hospital, discharge time is determined by your treatment team.               Patients discharged the day of surgery will not be allowed to drive home.  Name and phone number of your driver: [facility] Preparing for Surgery  Before surgery, you can play an important role.  Because skin is not sterile, your skin needs to be as free of germs as possible.  You can reduce the number of germs on you skin by washing with CHG (chlorahexidine gluconate) soap before surgery.  CHG is an antiseptic cleaner which kills germs and bonds with the skin to continue killing germs even after washing.  Please DO NOT use if you have an allergy to CHG or antibacterial soaps.  If your skin becomes reddened/irritated stop using the CHG and inform your nurse when you arrive at Short Stay.  Do not shave (including legs and underarms) for at least 48 hours prior to the first CHG shower.  You may shave your face.  Please follow these instructions carefully:   1.  Shower with CHG Soap the night before surgery and the                                 morning of Surgery.  2.  If you choose to wash your hair, wash your hair first as usual with your       normal shampoo.  3.  After you shampoo, rinse your hair and body thoroughly to remove the                      Shampoo.  4.  Use CHG as you would any other liquid soap.  You can apply chg directly       to the skin and wash gently with scrungie or a clean washcloth.  5.  Apply the CHG Soap to your body ONLY FROM THE NECK DOWN.        Do not use on open wounds or open sores.  Avoid contact with your eyes,       ears, mouth and genitals (private parts).  Wash genitals (private parts)       with your normal soap.  6.  Wash thoroughly, paying special attention to the area where your surgery  will be performed.  7.  Thoroughly rinse your body with warm water from the neck down.  8.  DO NOT shower/wash with your normal soap after using and rinsing off       the CHG Soap.  9.  Pat yourself dry with a clean towel.            10.  Wear clean pajamas.            11.  Place clean sheets on your bed the night of your first shower and do not        sleep with pets.  Day of Surgery  Do not apply any lotions/deoderants the morning of surgery.  Please wear clean clothes to the hospital/surgery center.       Please read over the following fact sheets that you were given: Pain Booklet, Coughing and Deep Breathing and Surgical Site Infection Prevention

## 2014-03-20 ENCOUNTER — Encounter (HOSPITAL_COMMUNITY)
Admission: RE | Admit: 2014-03-20 | Discharge: 2014-03-20 | Disposition: A | Discharge status: Home / Self Care | Payer: 59 | Source: Ambulatory Visit | Attending: General Surgery | Admitting: General Surgery

## 2014-03-20 ENCOUNTER — Encounter (HOSPITAL_COMMUNITY)
Admission: RE | Admit: 2014-03-20 | Discharge: 2014-03-20 | Disposition: A | Discharge status: Home / Self Care | Payer: 59 | Source: Ambulatory Visit | Attending: Anesthesiology | Admitting: Anesthesiology

## 2014-03-20 ENCOUNTER — Encounter (HOSPITAL_COMMUNITY): Payer: Self-pay

## 2014-03-20 DIAGNOSIS — K8020 Calculus of gallbladder without cholecystitis without obstruction: Secondary | ICD-10-CM | POA: Diagnosis not present

## 2014-03-20 DIAGNOSIS — Z01818 Encounter for other preprocedural examination: Secondary | ICD-10-CM | POA: Insufficient documentation

## 2014-03-20 HISTORY — DX: Anemia, unspecified: D64.9

## 2014-03-20 HISTORY — DX: Nausea with vomiting, unspecified: R11.2

## 2014-03-20 HISTORY — DX: Family history of other specified conditions: Z84.89

## 2014-03-20 HISTORY — DX: Hypothyroidism, unspecified: E03.9

## 2014-03-20 HISTORY — DX: Other specified postprocedural states: Z98.890

## 2014-03-20 HISTORY — DX: Carpal tunnel syndrome, unspecified upper limb: G56.00

## 2014-03-20 HISTORY — DX: Major depressive disorder, single episode, unspecified: F32.9

## 2014-03-20 HISTORY — DX: Depression, unspecified: F32.A

## 2014-03-20 LAB — BASIC METABOLIC PANEL
Anion gap: 15 (ref 5–15)
BUN: 9 mg/dL (ref 6–23)
CALCIUM: 9.3 mg/dL (ref 8.4–10.5)
CO2: 23 mEq/L (ref 19–32)
Chloride: 101 mEq/L (ref 96–112)
Creatinine, Ser: 0.6 mg/dL (ref 0.50–1.10)
GFR calc Af Amer: >90 mL/min (ref >90)
GLUCOSE: 125 mg/dL — AB (ref 70–99)
POTASSIUM: 4.1 meq/L (ref 3.7–5.3)
SODIUM: 139 meq/L (ref 137–147)

## 2014-03-20 LAB — CBC
HCT: 40.6 % (ref 36.0–46.0)
Hemoglobin: 13.5 g/dL (ref 12.0–15.0)
MCH: 29 pg (ref 26.0–34.0)
MCHC: 33.3 g/dL (ref 30.0–36.0)
MCV: 87.3 fL (ref 78.0–100.0)
Platelets: 248 10*3/uL (ref 150–400)
RBC: 4.65 MIL/uL (ref 3.87–5.11)
RDW: 13.2 % (ref 11.5–15.5)
WBC: 7 10*3/uL (ref 4.0–10.5)

## 2014-03-20 LAB — HCG, SERUM, QUALITATIVE: Preg, Serum: NEGATIVE

## 2014-03-20 MED ORDER — CHLORHEXIDINE GLUCONATE 4 % EX LIQD: 1.0000 "application " | Freq: Once | CUTANEOUS | Status: DC

## 2014-03-24 MED ORDER — VANCOMYCIN HCL IN DEXTROSE 1-5 GM/200ML-% IV SOLN
1000.0000 mg | INTRAVENOUS | Status: DC
  Filled 2014-03-24: qty 200

## 2014-03-25 ENCOUNTER — Encounter (HOSPITAL_COMMUNITY): Payer: Self-pay | Admitting: Certified Registered Nurse Anesthetist

## 2014-03-25 ENCOUNTER — Encounter (HOSPITAL_COMMUNITY): Payer: 59 | Admitting: Certified Registered Nurse Anesthetist

## 2014-03-25 ENCOUNTER — Ambulatory Visit (HOSPITAL_COMMUNITY)
Admission: RE | Admit: 2014-03-25 | Discharge: 2014-03-25 | Disposition: A | Discharge status: Home / Self Care | Payer: 59 | Source: Ambulatory Visit | Attending: General Surgery | Admitting: General Surgery

## 2014-03-25 ENCOUNTER — Encounter (HOSPITAL_COMMUNITY)
Admission: RE | Disposition: A | Discharge status: Home / Self Care | Payer: Self-pay | Source: Ambulatory Visit | Attending: General Surgery

## 2014-03-25 ENCOUNTER — Ambulatory Visit (HOSPITAL_COMMUNITY): Payer: 59 | Admitting: Certified Registered Nurse Anesthetist

## 2014-03-25 DIAGNOSIS — E039 Hypothyroidism, unspecified: Secondary | ICD-10-CM | POA: Diagnosis not present

## 2014-03-25 DIAGNOSIS — Z880 Allergy status to penicillin: Secondary | ICD-10-CM | POA: Insufficient documentation

## 2014-03-25 DIAGNOSIS — F329 Major depressive disorder, single episode, unspecified: Secondary | ICD-10-CM | POA: Diagnosis not present

## 2014-03-25 DIAGNOSIS — F3289 Other specified depressive episodes: Secondary | ICD-10-CM | POA: Diagnosis not present

## 2014-03-25 DIAGNOSIS — J45909 Unspecified asthma, uncomplicated: Secondary | ICD-10-CM | POA: Diagnosis not present

## 2014-03-25 DIAGNOSIS — Z87891 Personal history of nicotine dependence: Secondary | ICD-10-CM | POA: Insufficient documentation

## 2014-03-25 DIAGNOSIS — K811 Chronic cholecystitis: Secondary | ICD-10-CM | POA: Insufficient documentation

## 2014-03-25 DIAGNOSIS — Z79899 Other long term (current) drug therapy: Secondary | ICD-10-CM | POA: Insufficient documentation

## 2014-03-25 DIAGNOSIS — K219 Gastro-esophageal reflux disease without esophagitis: Secondary | ICD-10-CM | POA: Insufficient documentation

## 2014-03-25 DIAGNOSIS — R51 Headache: Secondary | ICD-10-CM | POA: Diagnosis not present

## 2014-03-25 DIAGNOSIS — K8020 Calculus of gallbladder without cholecystitis without obstruction: Secondary | ICD-10-CM | POA: Diagnosis present

## 2014-03-25 HISTORY — PX: CHOLECYSTECTOMY: SHX55

## 2014-03-25 SURGERY — LAPAROSCOPIC CHOLECYSTECTOMY
Anesthesia: General | Site: Abdomen

## 2014-03-25 MED ORDER — HYDROCODONE-ACETAMINOPHEN 5-325 MG PO TABS
ORAL_TABLET | ORAL | Status: AC
  Administered 2014-03-25: 2
  Filled 2014-03-25: qty 2

## 2014-03-25 MED ORDER — SCOPOLAMINE 1 MG/3DAYS TD PT72
MEDICATED_PATCH | TRANSDERMAL | Status: DC
Start: 2014-03-25 — End: 2014-03-25
  Filled 2014-03-25: qty 1

## 2014-03-25 MED ORDER — PROPOFOL 10 MG/ML IV BOLUS
INTRAVENOUS | Status: DC | PRN
Start: 2014-03-25 — End: 2014-03-25
  Administered 2014-03-25: 160 mg via INTRAVENOUS

## 2014-03-25 MED ORDER — MIDAZOLAM HCL 2 MG/2ML IJ SOLN
INTRAMUSCULAR | Status: AC
  Filled 2014-03-25: qty 2

## 2014-03-25 MED ORDER — ONDANSETRON HCL 4 MG/2ML IJ SOLN
INTRAMUSCULAR | Status: AC
  Filled 2014-03-25: qty 2

## 2014-03-25 MED ORDER — BUPIVACAINE HCL 0.25 % IJ SOLN
INTRAMUSCULAR | Status: DC | PRN
  Administered 2014-03-25: 5 mL

## 2014-03-25 MED ORDER — ROCURONIUM BROMIDE 50 MG/5ML IV SOLN
INTRAVENOUS | Status: AC
  Filled 2014-03-25: qty 1

## 2014-03-25 MED ORDER — PROPOFOL 10 MG/ML IV BOLUS
INTRAVENOUS | Status: AC
  Filled 2014-03-25: qty 20

## 2014-03-25 MED ORDER — OXYCODONE-ACETAMINOPHEN 5-325 MG PO TABS: 1.0000 | ORAL_TABLET | ORAL | Status: DC | PRN

## 2014-03-25 MED ORDER — GLYCOPYRROLATE 0.2 MG/ML IJ SOLN
INTRAMUSCULAR | Status: AC
  Filled 2014-03-25: qty 2

## 2014-03-25 MED ORDER — ROCURONIUM BROMIDE 100 MG/10ML IV SOLN
INTRAVENOUS | Status: DC | PRN
  Administered 2014-03-25: 50 mg via INTRAVENOUS

## 2014-03-25 MED ORDER — LIDOCAINE HCL (CARDIAC) 20 MG/ML IV SOLN
INTRAVENOUS | Status: AC
  Filled 2014-03-25: qty 5

## 2014-03-25 MED ORDER — GLYCOPYRROLATE 0.2 MG/ML IJ SOLN
INTRAMUSCULAR | Status: DC | PRN
Start: 2014-03-25 — End: 2014-03-25
  Administered 2014-03-25: 0.4 mg via INTRAVENOUS

## 2014-03-25 MED ORDER — LACTATED RINGERS IV SOLN
INTRAVENOUS | Status: DC
  Administered 2014-03-25: 10:00:00 via INTRAVENOUS

## 2014-03-25 MED ORDER — HYDROMORPHONE HCL PF 1 MG/ML IJ SOLN
0.2500 mg | INTRAMUSCULAR | Status: DC | PRN
Start: 2014-03-25 — End: 2014-03-25
  Administered 2014-03-25 (×2): 0.5 mg via INTRAVENOUS

## 2014-03-25 MED ORDER — FENTANYL CITRATE 0.05 MG/ML IJ SOLN
INTRAMUSCULAR | Status: DC | PRN
  Administered 2014-03-25: 100 ug via INTRAVENOUS
  Administered 2014-03-25: 50 ug via INTRAVENOUS
  Administered 2014-03-25 (×2): 100 ug via INTRAVENOUS
  Administered 2014-03-25: 50 ug via INTRAVENOUS
  Administered 2014-03-25: 100 ug via INTRAVENOUS

## 2014-03-25 MED ORDER — LACTATED RINGERS IV SOLN
INTRAVENOUS | Status: DC | PRN
  Administered 2014-03-25: 12:00:00 via INTRAVENOUS

## 2014-03-25 MED ORDER — PROMETHAZINE HCL 25 MG/ML IJ SOLN
INTRAMUSCULAR | Status: AC
  Filled 2014-03-25: qty 1

## 2014-03-25 MED ORDER — 0.9 % SODIUM CHLORIDE (POUR BTL) OPTIME
TOPICAL | Status: DC | PRN
  Administered 2014-03-25: 1000 mL

## 2014-03-25 MED ORDER — OXYCODONE-ACETAMINOPHEN 5-325 MG PO TABS: 2.0000 | ORAL_TABLET | ORAL | Status: DC | PRN

## 2014-03-25 MED ORDER — FENTANYL CITRATE 0.05 MG/ML IJ SOLN
INTRAMUSCULAR | Status: AC
  Filled 2014-03-25: qty 5

## 2014-03-25 MED ORDER — LIDOCAINE HCL (CARDIAC) 20 MG/ML IV SOLN
INTRAVENOUS | Status: DC | PRN
  Administered 2014-03-25: 80 mg via INTRAVENOUS

## 2014-03-25 MED ORDER — NEOSTIGMINE METHYLSULFATE 10 MG/10ML IV SOLN
INTRAVENOUS | Status: AC
  Filled 2014-03-25: qty 1

## 2014-03-25 MED ORDER — DIPHENHYDRAMINE HCL 50 MG/ML IJ SOLN
INTRAMUSCULAR | Status: AC
  Filled 2014-03-25: qty 1

## 2014-03-25 MED ORDER — DIPHENHYDRAMINE HCL 50 MG/ML IJ SOLN
12.5000 mg | Freq: Once | INTRAMUSCULAR | Status: AC
  Administered 2014-03-25: 12.5 mg via INTRAVENOUS

## 2014-03-25 MED ORDER — MIDAZOLAM HCL 5 MG/5ML IJ SOLN
INTRAMUSCULAR | Status: DC | PRN
  Administered 2014-03-25: 2 mg via INTRAVENOUS

## 2014-03-25 MED ORDER — HYDROMORPHONE HCL PF 1 MG/ML IJ SOLN
INTRAMUSCULAR | Status: AC
  Filled 2014-03-25: qty 1

## 2014-03-25 MED ORDER — SODIUM CHLORIDE 0.9 % IR SOLN
Status: DC | PRN
  Administered 2014-03-25: 1000 mL

## 2014-03-25 MED ORDER — NEOSTIGMINE METHYLSULFATE 10 MG/10ML IV SOLN
INTRAVENOUS | Status: DC | PRN
  Administered 2014-03-25: 3 mg via INTRAVENOUS

## 2014-03-25 MED ORDER — ONDANSETRON HCL 4 MG/2ML IJ SOLN
INTRAMUSCULAR | Status: DC | PRN
  Administered 2014-03-25: 4 mg via INTRAVENOUS

## 2014-03-25 MED ORDER — SCOPOLAMINE 1 MG/3DAYS TD PT72
1.0000 | MEDICATED_PATCH | TRANSDERMAL | Status: DC
  Administered 2014-03-25: 1.5 mg via TRANSDERMAL

## 2014-03-25 MED ORDER — FENTANYL CITRATE 0.05 MG/ML IJ SOLN
INTRAMUSCULAR | Status: AC
  Administered 2014-03-25: 50 ug
  Filled 2014-03-25: qty 2

## 2014-03-25 MED ORDER — PROMETHAZINE HCL 25 MG/ML IJ SOLN
6.2500 mg | INTRAMUSCULAR | Status: DC | PRN
  Administered 2014-03-25: 6.25 mg via INTRAVENOUS

## 2014-03-25 SURGICAL SUPPLY — 51 items
APL SKNCLS STERI-STRIP NONHPOA (GAUZE/BANDAGES/DRESSINGS) ×1
BAG SPEC RTRVL 10 TROC 200 (ENDOMECHANICALS) ×1
BAG SPEC RTRVL LRG 6X4 10 (ENDOMECHANICALS)
BENZOIN TINCTURE PRP APPL 2/3 (GAUZE/BANDAGES/DRESSINGS) ×2 IMPLANT
CANISTER SUCTION 2500CC (MISCELLANEOUS) ×2 IMPLANT
CHLORAPREP W/TINT 26ML (MISCELLANEOUS) ×2 IMPLANT
CLIP LIGATING HEMO O LOK GREEN (MISCELLANEOUS) ×2 IMPLANT
COVER MAYO STAND STRL (DRAPES) IMPLANT
COVER SURGICAL LIGHT HANDLE (MISCELLANEOUS) ×2 IMPLANT
COVER TRANSDUCER ULTRASND (DRAPES) ×2 IMPLANT
DEVICE TROCAR PUNCTURE CLOSURE (ENDOMECHANICALS) ×2 IMPLANT
DRAPE C-ARM 42X72 X-RAY (DRAPES) IMPLANT
DRAPE UTILITY 15X26 W/TAPE STR (DRAPE) ×4 IMPLANT
ELECT REM PT RETURN 9FT ADLT (ELECTROSURGICAL) ×2
ELECTRODE REM PT RTRN 9FT ADLT (ELECTROSURGICAL) ×1 IMPLANT
GAUZE SPONGE 2X2 8PLY STRL LF (GAUZE/BANDAGES/DRESSINGS) ×1 IMPLANT
GLOVE BIO SURGEON STRL SZ7.5 (GLOVE) ×2 IMPLANT
GLOVE BIOGEL PI IND STRL 6.5 (GLOVE) IMPLANT
GLOVE BIOGEL PI IND STRL 7.0 (GLOVE) IMPLANT
GLOVE BIOGEL PI INDICATOR 6.5 (GLOVE) ×1
GLOVE BIOGEL PI INDICATOR 7.0 (GLOVE) ×1
GLOVE SURG SS PI 6.0 STRL IVOR (GLOVE) ×1 IMPLANT
GLOVE SURG SS PI 6.5 STRL IVOR (GLOVE) ×2 IMPLANT
GLOVE SURG SS PI 7.5 STRL IVOR (GLOVE) ×1 IMPLANT
GOWN STRL REUS W/ TWL LRG LVL3 (GOWN DISPOSABLE) ×2 IMPLANT
GOWN STRL REUS W/ TWL XL LVL3 (GOWN DISPOSABLE) ×1 IMPLANT
GOWN STRL REUS W/TWL LRG LVL3 (GOWN DISPOSABLE) ×4
GOWN STRL REUS W/TWL XL LVL3 (GOWN DISPOSABLE) ×2
IV CATH 14GX2 1/4 (CATHETERS) IMPLANT
KIT BASIN OR (CUSTOM PROCEDURE TRAY) ×2 IMPLANT
KIT ROOM TURNOVER OR (KITS) ×2 IMPLANT
NDL INSUFFLATION 14GA 120MM (NEEDLE) ×1 IMPLANT
NEEDLE INSUFFLATION 14GA 120MM (NEEDLE) ×2 IMPLANT
NS IRRIG 1000ML POUR BTL (IV SOLUTION) ×2 IMPLANT
PAD ARMBOARD 7.5X6 YLW CONV (MISCELLANEOUS) ×4 IMPLANT
POUCH RETRIEVAL ECOSAC 10 (ENDOMECHANICALS) IMPLANT
POUCH RETRIEVAL ECOSAC 10MM (ENDOMECHANICALS) ×1
POUCH SPECIMEN RETRIEVAL 10MM (ENDOMECHANICALS) IMPLANT
SCISSORS LAP 5X35 DISP (ENDOMECHANICALS) ×2 IMPLANT
SET CHOLANGIOGRAPHY FRANKLIN (SET/KITS/TRAYS/PACK) IMPLANT
SET IRRIG TUBING LAPAROSCOPIC (IRRIGATION / IRRIGATOR) ×2 IMPLANT
SLEEVE ENDOPATH XCEL 5M (ENDOMECHANICALS) ×2 IMPLANT
SPECIMEN JAR SMALL (MISCELLANEOUS) ×2 IMPLANT
SPONGE GAUZE 2X2 STER 10/PKG (GAUZE/BANDAGES/DRESSINGS) ×1
SUT MNCRL AB 3-0 PS2 18 (SUTURE) ×2 IMPLANT
TAPE CLOTH SOFT 2X10 (GAUZE/BANDAGES/DRESSINGS) ×1 IMPLANT
TOWEL OR 17X24 6PK STRL BLUE (TOWEL DISPOSABLE) ×2 IMPLANT
TOWEL OR 17X26 10 PK STRL BLUE (TOWEL DISPOSABLE) ×2 IMPLANT
TRAY LAPAROSCOPIC (CUSTOM PROCEDURE TRAY) ×2 IMPLANT
TROCAR XCEL NON-BLD 11X100MML (ENDOMECHANICALS) ×2 IMPLANT
TROCAR XCEL NON-BLD 5MMX100MML (ENDOMECHANICALS) ×2 IMPLANT

## 2014-03-25 NOTE — H&P (Signed)
HPI  Christine [name] is a 43 y.o. female. The patient is a 43 year old female who is referred by Dr. Daun Peacock for evaluation of stenotic cholelithiasis. History she's had numerous episodes right upper quadrant pain that radiated to her back. Patient's undergone ultrasound reveals gallstones.  Of note the patient's had previous laparoscopic ovarian and hand-assisted sigmoid colectomy for diverticulitis per Dr. Barry Dienes in 2012.  HPI  Past Medical History   Diagnosis  Date   .  Migraine, unspecified, without mention of intractable migraine without mention of status migrainosus    .  Miscarriage  2007   .  Diverticulitis      recurrent   .  Allergy    .  Asthma      Acute asthmatic bronchitis 2011 while pregnant   .  GERD (gastroesophageal reflux disease)    .  Thyroid disease     Past Surgical History   Procedure  Laterality  Date   .  Laparoscopy   5/94     x 2   .  Dilation and curettage of uterus       miscarriage   .  Sigmoidectomy       Hand-assisted laparoscopic sigmoidectomy with splenic flexure takedown.    Family History   Problem  Relation  Age of Onset   .  Barrett's esophagus  Mother    .  Diabetes  Mother    .  Colon polyps  Father    .  Diabetes  Father    .  Diabetes  Maternal Grandfather    .  Colon cancer  Neg Hx    .  Alcohol abuse  Other    .  Arthritis  Other    .  Hyperlipidemia  Other    .  Hypertension  Other    .  Stroke  Other    .  Coronary artery disease  Other    .  Clotting disorder  Other      Government social research officer   .  Crohn's disease  Cousin    Social History  History   Substance Use Topics   .  Smoking status:  Former Smoker     Quit date:  01/31/2000   .  Smokeless tobacco:  Never Used   .  Alcohol Use:  Yes    Allergies   Allergen  Reactions   .  Hydrocodone    .  Penicillins     Current Outpatient Prescriptions   Medication  Sig  Dispense  Refill   .  fluticasone (FLONASE) 50 MCG/ACT nasal spray  Place 2 sprays into the nose  daily.  16 g  6   .  levocetirizine (XYZAL) 5 MG tablet  TAKE 1 TABLET BY MOUTH EVERY EVENING  30 tablet  0   .  levothyroxine (SYNTHROID) 100 MCG tablet  Take 100 mcg by mouth daily before breakfast.     .  omeprazole (PRILOSEC) 20 MG capsule  Take 1 capsule (20 mg total) by mouth daily.  30 capsule  3   .  ondansetron (ZOFRAN) 4 MG tablet  Take 1 tablet (4 mg total) by mouth every 8 (eight) hours as needed for nausea or vomiting.  20 tablet  0   .  traMADol (ULTRAM) 50 MG tablet  Take 1 tablet (50 mg total) by mouth every 8 (eight) hours as needed for moderate pain.  30 tablet  0    No current facility-administered medications for this visit.  Review of Systems  Review of Systems  Constitutional: Negative.  HENT: Negative.  Respiratory: Negative.  Cardiovascular: Negative.  Gastrointestinal: Positive for nausea and abdominal pain.  Neurological: Negative.  All other systems reviewed and are negative.  BP 132/66  Pulse 83  Temp(Src) 97.9 F (36.6 C)  Resp 20  Ht 5\' 9"  (1.753 m)  Wt 216 lb (97.977 kg)  BMI 31.88 kg/m2  SpO2 98%  LMP 03/02/2014  Physical Exam  Physical Exam  Constitutional: She is oriented to person, place, and time. She appears well-developed and well-nourished.  HENT:  Head: Normocephalic and atraumatic.  Eyes: Conjunctivae and EOM are normal. Pupils are equal, round, and reactive to light.  Neck: Normal range of motion. Neck supple.  Cardiovascular: Normal rate, regular rhythm and normal heart sounds.  Pulmonary/Chest: Effort normal and breath sounds normal.  Abdominal: Soft. Bowel sounds are normal. She exhibits no distension. There is no tenderness. There is no rebound and no guarding.  Musculoskeletal: Normal range of motion.  Neurological: She is alert and oriented to person, place, and time.  Skin: Skin is warm and dry.  Psychiatric: She has a normal mood and affect.  Data Reviewed  Ultrasound reveals multiple small gallstones  Assessment   43 year old female with cholelithiasis  Plan  1. We'll proceed to the operative for laparoscopic cholecystectomy  2.All risks and benefits were discussed with the patient to generally include: infection, bleeding, possible need for post op ERCP, damage to the bile ducts, and bile leak. Alternatives were offered and described. All questions were answered and the patient voiced understanding of the procedure and wishes to proceed at this point with a laparoscopic cholecystectomy

## 2014-03-25 NOTE — Progress Notes (Signed)
Pt c/o mis chest gas pain assisted up to recliner.feels better in chair

## 2014-03-25 NOTE — Progress Notes (Signed)
Pt crying says she just feels tearful

## 2014-03-25 NOTE — Op Note (Signed)
03/25/2014  12:30 PM  PATIENT:  Christine [name]  43 y.o. female  PRE-OPERATIVE DIAGNOSIS:  cholelithiasis  POST-OPERATIVE DIAGNOSIS:  cholelithiasis  PROCEDURE:  Procedure(s): LAPAROSCOPIC CHOLECYSTECTOMY (N/A)  SURGEON:  Surgeon(s) and Role:    * Ralene Ok, MD - Primary   ASSISTANTS: none   ANESTHESIA:   local and general  EBL:  Total I/O In: -  Out: 10 [Blood:10]  BLOOD ADMINISTERED:none  DRAINS: none   LOCAL MEDICATIONS USED:  BUPIVICAINE   SPECIMEN:  Source of Specimen:  gallbladder  DISPOSITION OF SPECIMEN:  PATHOLOGY  COUNTS:  YES  TOURNIQUET:  * No tourniquets in log *  DICTATION: .Dragon Dictation  Details of the procedure: The patient was taken to the operating and placed in the supine position with bilateral SCDs in place. A time out was called and all facts were verified. A pneumoperitoneum was obtained via A Veress needle technique to a pressure of 47mm of mercury. A 48mm trochar was then placed in the right upper quadrant under visualization, and there were no injuries to any abdominal organs. A 11 mm port was then placed in the umbilical region after infiltrating with local anesthesia under direct visualization. A second epigastric port was placed under direct visualization. The gallbladder was identified and retracted, the peritoneum was then sharply dissected from the gallbladder and this dissection was carried down to Calot's triangle. The cystic duct was identified and stripped away circumferentially and seen going into the gallbladder 360, the critical angle was obtained.  2 clips were placed proximally one distally and the cystic duct transected. The cystic artery was identified and 2 clips placed proximally and one distally and transected. We then proceeded to remove the gallbladder off the hepatic fossa with Bovie cautery. A retrieval bag was then placed in the abdomen and gallbladder placed in the bag. The hepatic fossa was then reexamined and  hemostasis was achieved with Bovie cautery and was excellent at this portion of the case. The subhepatic fossa and perihepatic fossa was then irrigated until the effluent was clear. The 11 mm trocar fascia was reapproximated with the Endo Close #1 Vicryl x1. The pneumoperitoneum was evacuated and all trochars removed under direct visulalization. The skin was then closed with 4-0 Monocryl and the skin dressed with Steri-Strips, gauze, and tape. The patient was awaken from general anesthesia and taken to the recovery room in stable condition.    PLAN OF CARE: Discharge to home after PACU  PATIENT DISPOSITION:  PACU - hemodynamically stable.   Delay start of Pharmacological VTE agent (>24hrs) due to surgical blood loss or risk of bleeding: not applicable

## 2014-03-25 NOTE — Anesthesia Preprocedure Evaluation (Addendum)
Anesthesia Evaluation  Patient identified by MRN, date of birth, ID band Patient awake    Reviewed: Allergy & Precautions, H&P , NPO status , reviewed documented beta blocker date and time   History of Anesthesia Complications (+) PONV and history of anesthetic complications  Airway Mallampati: II TM Distance: >3 FB Neck ROM: Full    Dental  (+) Teeth Intact, Dental Advisory Given   Pulmonary asthma , former smoker,    Pulmonary exam normal       Cardiovascular negative cardio ROS      Neuro/Psych  Headaches, Depression    GI/Hepatic Neg liver ROS, GERD-  Medicated,  Endo/Other  Hypothyroidism   Renal/GU negative Renal ROS     Musculoskeletal   Abdominal   Peds  Hematology   Anesthesia Other Findings   Reproductive/Obstetrics                          Anesthesia Physical Anesthesia Plan  ASA: II  Anesthesia Plan: General   Post-op Pain Management:    Induction: Intravenous  Airway Management Planned: Oral ETT  Additional Equipment:   Intra-op Plan:   Post-operative Plan: Extubation in OR  Informed Consent: I have reviewed the patients History and Physical, chart, labs and discussed the procedure including the risks, benefits and alternatives for the proposed anesthesia with the patient or authorized representative who has indicated his/her understanding and acceptance.   Dental advisory given  Plan Discussed with: CRNA, Anesthesiologist and Surgeon  Anesthesia Plan Comments:        Anesthesia Quick Evaluation

## 2014-03-25 NOTE — Discharge Instructions (Signed)
[facility] ______[facility] °LAPAROSCOPIC SURGERY: POST OP INSTRUCTIONS °Always review your discharge instruction sheet given to you by the facility where your surgery was performed. °IF YOU HAVE DISABILITY OR FAMILY LEAVE FORMS, YOU MUST BRING THEM TO THE OFFICE FOR PROCESSING.   °DO NOT GIVE THEM TO YOUR DOCTOR. ° °1. A prescription for pain medication may be given to you upon discharge.  Take your pain medication as prescribed, if needed.  If narcotic pain medicine is not needed, then you may take acetaminophen (Tylenol) or ibuprofen (Advil) as needed. °2. Take your usually prescribed medications unless otherwise directed. °3. If you need a refill on your pain medication, please contact your pharmacy.  They will contact our office to request authorization. Prescriptions will not be filled after 5pm or on week-ends. °4. You should follow a light diet the first few days after arrival home, such as soup and crackers, etc.  Be sure to include lots of fluids daily. °5. Most patients will experience some swelling and bruising in the area of the incisions.  Ice packs will help.  Swelling and bruising can take several days to resolve.  °6. It is common to experience some constipation if taking pain medication after surgery.  Increasing fluid intake and taking a stool softener (such as Colace) will usually help or prevent this problem from occurring.  A mild laxative (Milk of Magnesia or Miralax) should be taken according to package instructions if there are no bowel movements after 48 hours. °7. Unless discharge instructions indicate otherwise, you may remove your bandages 24-48 hours after surgery, and you may shower at that time.  You may have steri-strips (small skin tapes) in place directly over the incision.  These strips should be left on the skin for 7-10 days.  If your surgeon used skin glue on the incision, you may shower in 24 hours.  The glue will flake off over the next 2-3 weeks.  Any sutures or  staples will be removed at the office during your follow-up visit. °8. ACTIVITIES:  You may resume regular (light) daily activities beginning the next day--such as daily self-care, walking, climbing stairs--gradually increasing activities as tolerated.  You may have sexual intercourse when it is comfortable.  Refrain from any heavy lifting or straining until approved by your doctor. °a. You may drive when you are no longer taking prescription pain medication, you can comfortably wear a seatbelt, and you can safely maneuver your car and apply brakes. °b. RETURN TO WORK:  __________________________________________________________ °9. You should see your doctor in the office for a follow-up appointment approximately 2-3 weeks after your surgery.  Make sure that you call for this appointment within a day or two after you arrive home to insure a convenient appointment time. °10. OTHER INSTRUCTIONS: __________________________________________________________________________________________________________________________ __________________________________________________________________________________________________________________________ °WHEN TO CALL YOUR DOCTOR: °1. Fever over 101.0 °2. Inability to urinate °3. Continued bleeding from incision. °4. Increased pain, redness, or drainage from the incision. °5. Increasing abdominal pain ° °The clinic staff is available to answer your questions during regular business hours.  Please don’t hesitate to call and ask to speak to one of the nurses for clinical concerns.  If you have a medical emergency, go to the nearest emergency room or call 911.  A surgeon from [facility] is always on call at the hospital. °[address], [address], [place], [place]  [address] ? [address] [address], [place], [place]   [address] °[phone] ? [phone] ? FAX [phone] °Web site:   [email or web address] ° °What to eat: ° °For your first meals, you should eat  lightly; only small meals initially.  If you do not have nausea, you may eat larger meals.  Avoid spicy, greasy and heavy food.   ° °General Anesthesia, Adult, Care After  °Refer to this sheet in the next few weeks. These instructions provide you with information on caring for yourself after your procedure. Your health care provider may also give you more specific instructions. Your treatment has been planned according to current medical practices, but problems sometimes occur. Call your health care provider if you have any problems or questions after your procedure.  °WHAT TO EXPECT AFTER THE PROCEDURE  °After the procedure, it is typical to experience:  °Sleepiness.  °Nausea and vomiting. °HOME CARE INSTRUCTIONS  °For the first 24 hours after general anesthesia:  °Have a responsible person with you.  °Do not drive a car. If you are alone, do not take public transportation.  °Do not drink alcohol.  °Do not take medicine that has not been prescribed by your health care provider.  °Do not sign important papers or make important decisions.  °You may resume a normal diet and activities as directed by your health care provider.  °Change bandages (dressings) as directed.  °If you have questions or problems that seem related to general anesthesia, call the hospital and ask for the anesthetist or anesthesiologist on call. °SEEK MEDICAL CARE IF:  °You have nausea and vomiting that continue the day after anesthesia.  °You develop a rash. °SEEK IMMEDIATE MEDICAL CARE IF:  °You have difficulty breathing.  °You have chest pain.  °You have any allergic problems. °Document Released: 10/25/2000 Document Revised: 03/21/2013 Document Reviewed: 02/01/2013  °ExitCare® Patient Information ©2014 [employer].  ° ° °

## 2014-03-25 NOTE — Transfer of Care (Signed)
Immediate Anesthesia Transfer of Care Note  Patient: Christine [name]  Procedure(s) Performed: Procedure(s): LAPAROSCOPIC CHOLECYSTECTOMY (N/A)  Patient Location: PACU  Anesthesia Type:General  Level of Consciousness: awake, alert  and oriented  Airway & Oxygen Therapy: Patient Spontanous Breathing and Patient connected to nasal cannula oxygen  Post-op Assessment: Report given to PACU RN, Post -op Vital signs reviewed and stable and Patient moving all extremities  Post vital signs: Reviewed and stable  Complications: No apparent anesthesia complications

## 2014-03-25 NOTE — Anesthesia Postprocedure Evaluation (Signed)
Anesthesia Post Note  Patient: Christine [name]  Procedure(s) Performed: Procedure(s) (LRB): LAPAROSCOPIC CHOLECYSTECTOMY (N/A)  Anesthesia type: General  Patient location: PACU  Post pain: Pain level controlled and Adequate analgesia  Post assessment: Post-op Vital signs reviewed, Patient's Cardiovascular Status Stable, Respiratory Function Stable, Patent Airway and Pain level controlled  Last Vitals:  Filed Vitals:   03/25/14 1345  BP: 144/95  Pulse: 88  Temp:   Resp: 21    Post vital signs: Reviewed and stable  Level of consciousness: awake, alert  and oriented  Complications: No apparent anesthesia complications

## 2014-03-25 NOTE — Progress Notes (Signed)
Up to br with assistance.unable to void taken to [facility].pain score=4

## 2014-03-25 NOTE — Progress Notes (Signed)
Pt given dialudid although stated as allery pt has never had dilaudid but daughter and mother had hyperventilation episodes after reciving dilaudid

## 2014-03-26 ENCOUNTER — Encounter (HOSPITAL_COMMUNITY): Payer: Self-pay | Admitting: General Surgery

## 2014-03-27 ENCOUNTER — Telehealth (INDEPENDENT_AMBULATORY_CARE_PROVIDER_SITE_OTHER): Payer: Self-pay

## 2014-03-27 NOTE — Telephone Encounter (Signed)
Pt is s/p lap chole on 03/25/14 by Dr. Rosendo Gros.  She calls today to report temp of 100.2 and says she has noticed some vision changes: blurriness.  She has stopped taking her pain medication during the day because she has small children at home.  I told her she could take Tylenol for her low grade fever, and also Ibuprofen 800mg  q 8 hrs to help with her discomfort.  Will make Dr. Rosendo Gros aware and call her back today.

## 2014-03-27 NOTE — Telephone Encounter (Signed)
Dr. Rosendo Gros not concerned about 100.2 temp - not a fever yet.  Unsure about pt's blurriness.  Pt does wear glasses.  Advised her to monitor this, and if any changes or worsening of vision she should call her eye doctor.  Continue to take Ibuprofen and Tylenol prn.  Call us if necessary.

## 2014-03-29 ENCOUNTER — Telehealth: Payer: Self-pay | Admitting: Family

## 2014-03-29 ENCOUNTER — Encounter (INDEPENDENT_AMBULATORY_CARE_PROVIDER_SITE_OTHER): Payer: Self-pay | Admitting: General Surgery

## 2014-03-29 ENCOUNTER — Ambulatory Visit (INDEPENDENT_AMBULATORY_CARE_PROVIDER_SITE_OTHER): Payer: 59 | Admitting: General Surgery

## 2014-03-29 VITALS — BP 124/90 | HR 93 | Temp 99.6°F | Ht 69.0 in | Wt 213.5 lb

## 2014-03-29 DIAGNOSIS — Z9889 Other specified postprocedural states: Secondary | ICD-10-CM

## 2014-03-29 NOTE — Telephone Encounter (Signed)
Saw patient in the elevator as I was exiting building. She reports that she recently had laparoscopic surgery and has some "blisters" by her umbilicus.  She tells me that she does not have an appointment.  I told pt that unfortunately our office is closed for the afternoon due to moving. Advised her to contact her surgeon who performed her surgery for further evaluation. She verbalizes understanding.

## 2014-03-29 NOTE — Progress Notes (Signed)
Patient ID: Christine [name], female   DOB: 1970/11/30, 43 y.o.   MRN: [id number] Post op course The patient is a 43 year old female status post laparoscopic cholecystectomy. Patient has been doing well postoperatively. She did notice a blister near the superior wound underneath the Steri-Strips. She is concerned about this one to be examined.  The patient also noticed some foggy headedness, as well some are not blurry vision. She states discontinued at this time postoperatively  On Exam: Patient does have a superficial blister at the epigastric incision site, wound is clean dry and intact  Pathology:   Chronic cholecystitis.  This was discussed with the patient.  Assessment and Plan 43 year old female status post laparoscopic cholecystectomy with a small skin blister 1.  The suture was removed then the superficial skin blister was visualized, there is no rupture, there is no signs of infection, I discussed with the patient she can shower at this time. 2. I discussed with her to keep an eye on her blurry vision. She discontinued we will proceed with a CT scan of her head to rule out any type of CVA. 2. Patient follow up as scheduled.   Ralene Ok, MD [facility] General & Minimally Invasive Surgery Trauma & Emergency Surgery

## 2014-04-09 ENCOUNTER — Other Ambulatory Visit (INDEPENDENT_AMBULATORY_CARE_PROVIDER_SITE_OTHER): Payer: Self-pay

## 2014-04-09 ENCOUNTER — Encounter (INDEPENDENT_AMBULATORY_CARE_PROVIDER_SITE_OTHER): Payer: 59 | Admitting: General Surgery

## 2014-04-09 DIAGNOSIS — Z9049 Acquired absence of other specified parts of digestive tract: Secondary | ICD-10-CM

## 2014-04-09 DIAGNOSIS — R51 Headache: Secondary | ICD-10-CM

## 2014-04-09 DIAGNOSIS — H538 Other visual disturbances: Secondary | ICD-10-CM

## 2014-04-10 ENCOUNTER — Ambulatory Visit (HOSPITAL_BASED_OUTPATIENT_CLINIC_OR_DEPARTMENT_OTHER)
Admission: RE | Admit: 2014-04-10 | Discharge: 2014-04-10 | Disposition: A | Discharge status: Home / Self Care | Payer: 59 | Source: Ambulatory Visit | Attending: General Surgery | Admitting: General Surgery

## 2014-04-10 ENCOUNTER — Ambulatory Visit (HOSPITAL_BASED_OUTPATIENT_CLINIC_OR_DEPARTMENT_OTHER): Payer: 59

## 2014-04-10 ENCOUNTER — Other Ambulatory Visit: Payer: Self-pay | Admitting: Family

## 2014-04-10 ENCOUNTER — Encounter (HOSPITAL_BASED_OUTPATIENT_CLINIC_OR_DEPARTMENT_OTHER): Payer: Self-pay

## 2014-04-10 DIAGNOSIS — H538 Other visual disturbances: Secondary | ICD-10-CM | POA: Diagnosis not present

## 2014-04-10 DIAGNOSIS — R51 Headache: Secondary | ICD-10-CM | POA: Diagnosis present

## 2014-04-10 DIAGNOSIS — Z9049 Acquired absence of other specified parts of digestive tract: Secondary | ICD-10-CM

## 2014-04-12 ENCOUNTER — Telehealth (INDEPENDENT_AMBULATORY_CARE_PROVIDER_SITE_OTHER): Payer: Self-pay

## 2014-04-12 NOTE — Telephone Encounter (Signed)
Called and spoke to patient to give CT results ( no abnormalities or hernia present)  Patient verbalized understanding.

## 2014-05-17 ENCOUNTER — Other Ambulatory Visit: Payer: Self-pay

## 2014-06-06 ENCOUNTER — Encounter: Payer: Self-pay | Admitting: Medical

## 2014-06-06 ENCOUNTER — Ambulatory Visit (INDEPENDENT_AMBULATORY_CARE_PROVIDER_SITE_OTHER): Payer: 59 | Admitting: Medical

## 2014-06-06 VITALS — BP 126/84 | HR 78 | Temp 98.5°F | Ht 69.0 in | Wt 216.8 lb

## 2014-06-06 DIAGNOSIS — R062 Wheezing: Secondary | ICD-10-CM

## 2014-06-06 DIAGNOSIS — J209 Acute bronchitis, unspecified: Secondary | ICD-10-CM

## 2014-06-06 DIAGNOSIS — L739 Follicular disorder, unspecified: Secondary | ICD-10-CM

## 2014-06-06 MED ORDER — AZITHROMYCIN 250 MG PO TABS: ORAL_TABLET | ORAL | Status: DC

## 2014-06-06 MED ORDER — BENZONATATE 100 MG PO CAPS: 100.0000 mg | ORAL_CAPSULE | Freq: Three times a day (TID) | ORAL | Status: DC | PRN

## 2014-06-06 MED ORDER — BECLOMETHASONE DIPROPIONATE 40 MCG/ACT IN AERS: 2.0000 | INHALATION_SPRAY | Freq: Two times a day (BID) | RESPIRATORY_TRACT | Status: DC

## 2014-06-06 MED ORDER — PREDNISONE 20 MG PO TABS: ORAL_TABLET | ORAL | Status: DC

## 2014-06-06 MED ORDER — ALBUTEROL SULFATE HFA 108 (90 BASE) MCG/ACT IN AERS: 2.0000 | INHALATION_SPRAY | Freq: Four times a day (QID) | RESPIRATORY_TRACT | Status: DC | PRN

## 2014-06-06 NOTE — Progress Notes (Signed)
Subjective:    Patient ID: Christine [name], female    DOB: 10/29/1970, 43 y.o.   MRN: [id number]  HPI   Pt feels nasal congested for 2 wks off and on. Some productive cough. Some sinus pressure and teeth pain. Non smoker. Mild watery and itchy eyes.   Son sick as well.  Mid faint wheeze at night. Coughing a lot at night. Get some wheezin when gets bronchtis.  Pt feeling some more shortness of breath with activity. Some occasional cold sweats.  Slight rash on both lateral arms.   Past Medical History  Diagnosis Date  . Migraine, unspecified, without mention of intractable migraine without mention of status migrainosus   . Miscarriage 2007  . Diverticulitis     recurrent  . Allergy   . Asthma     Acute asthmatic bronchitis 2011 while pregnant  . GERD (gastroesophageal reflux disease)   . Thyroid disease   . PONV (postoperative nausea and vomiting)   . Family history of anesthesia complication     nausea and vomiting  . Hypothyroidism   . Depression   . Carpal tunnel syndrome     both wrist  . Anemia     stated h/o pernicious anemia    History   Social History  . Marital Status: Married    Spouse Name: N/A    Number of Children: 2  . Years of Education: N/A   Occupational History  . Not on file.   Social History Main Topics  . Smoking status: Former Smoker -- 0.50 packs/day for 3 years    Types: Cigarettes    Quit date: 01/31/2000  . Smokeless tobacco: Never Used  . Alcohol Use: Yes     Comment: social  . Drug Use: No  . Sexual Activity: Not on file   Other Topics Concern  . Not on file   Social History Narrative   Married, 2 daughters   Occupation: Housewife   Daily Caffeine Use   Patient does not get regular exercise.    Past Surgical History  Procedure Laterality Date  . Laparoscopy  5/94    x 2  . Dilation and curettage of uterus      miscarriage  . Sigmoidectomy      Hand-assisted laparoscopic sigmoidectomy with splenic flexure  takedown.  . Cholecystectomy N/A 03/25/2014    Procedure: LAPAROSCOPIC CHOLECYSTECTOMY;  Surgeon: Ralene Ok, MD;  Location: [facility] OR;  Service: General;  Laterality: N/A;    Family History  Problem Relation Age of Onset  . Barrett's esophagus Mother   . Diabetes Mother   . Colon polyps Father   . Diabetes Father   . Diabetes Maternal Grandfather   . Colon cancer Neg Hx   . Alcohol abuse Other   . Arthritis Other   . Hyperlipidemia Other   . Hypertension Other   . Stroke Other   . Coronary artery disease Other   . Clotting disorder Other     Government social research officer  . Crohn's disease Cousin     Allergies  Allergen Reactions  . Hydrocodone Nausea Only and Other (See Comments)    Hallucination   . Dilaudid [Hydromorphone Hcl]   . Latex Rash  . Penicillins Rash and Other (See Comments)    Childhood allergy    Current Outpatient Prescriptions on File Prior to Visit  Medication Sig Dispense Refill  . escitalopram (LEXAPRO) 10 MG tablet Take 10 mg by mouth daily.    . fluticasone (FLONASE) 50 MCG/ACT  nasal spray Place 2 sprays into the nose daily. 16 g 6  . Ketotifen Fumarate (ALAWAY OP) Place 1-2 drops into both eyes daily as needed (for itchy or dry eyes).    Marland Kitchen levocetirizine (XYZAL) 5 MG tablet TAKE 1 TABLET BY MOUTH EVERY EVENING 30 tablet 5  . levothyroxine (SYNTHROID) 100 MCG tablet Take 100 mcg by mouth daily before breakfast.    . omeprazole (PRILOSEC) 20 MG capsule Take 1 capsule (20 mg total) by mouth daily. 30 capsule 3  . oxyCODONE-acetaminophen (ROXICET) 5-325 MG per tablet Take 1-2 tablets by mouth every 4 (four) hours as needed. 30 tablet 0   No current facility-administered medications on file prior to visit.    BP 126/84 mmHg  Pulse 78  Temp(Src) 98.5 F (36.9 C) (Oral)  Ht 5\' 9"  (1.753 m)  Wt 216 lb 12.8 oz (98.34 kg)  BMI 32.00 kg/m2  SpO2 95%      Review of Systems  Constitutional: Negative for fever, chills and fatigue.  HENT: Positive for  congestion and sinus pressure. Negative for ear discharge, ear pain, facial swelling, mouth sores, nosebleeds, postnasal drip, rhinorrhea and sore throat.   Eyes: Positive for itching.  Respiratory: Positive for cough. Negative for chest tightness, shortness of breath and wheezing.        Productive cough.  Cardiovascular: Negative for chest pain and palpitations.  Gastrointestinal: Negative.   Genitourinary: Negative.   Musculoskeletal: Negative.   Skin:       Small raised bumps on both arms/tricep areas.       Objective:   Physical Exam  General  Mental Status - Alert. General Appearance - Well groomed. Not in acute distress.  Skin Rashes- No Rashes.  HEENT Head- Normal. Ear Auditory Canal - Left- Normal. Right - Normal.Tympanic Membrane- Left- Normal. Right- Normal. Eye Sclera/Conjunctiva- Left- Normal. Right- Normal. Nose & Sinuses Nasal Mucosa- Left-  Boggy + Congested. Right- Boggy + Congested. Maxillary sinus pressure. Mouth & Throat Lips: Upper Lip- Normal: no dryness, cracking, pallor, cyanosis, or vesicular eruption. Lower Lip-Normal: no dryness, cracking, pallor, cyanosis or vesicular eruption. Buccal Mucosa- Bilateral- No Aphthous ulcers. Oropharynx- No Discharge or Erythema. Tonsils: Characteristics- Bilateral- No Erythema or Congestion. Size/Enlargement- Bilateral- No enlargement. Discharge- bilateral-None.  Neck Neck- Supple. No Masses.   Chest and Lung Exam Auscultation: Breath Sounds:-Normal. Clear even unlabored  Cardiovascular Auscultation:Rythm- Regular.  Murmurs & Other Heart Sounds:Ausculatation of the heart reveal- No Murmurs.  Lymphatic Head & Neck General Head & Neck Lymphatics: Bilateral: Description- No Localized lymphadenopathy.  Skin- lateral arms follicles look inflamed.  Lower extremity- negative homans signs.       Assessment & Plan:

## 2014-06-06 NOTE — Patient Instructions (Addendum)
I am prescribing azithromycin for sinusitis and probable bronchitis.  For your wheezing, I am prescribing qvar and albuterol inhaler.  For your cough I prescribed benzonatate and for the nasal congestion flonase.  If your wheezing gets worse or you itching of arms get worse then start prednisone.  Follow up in 7 days or as needed.

## 2014-06-06 NOTE — Progress Notes (Signed)
Pre visit review using our clinic review tool, if applicable. No additional management support is needed unless otherwise documented below in the visit note. 

## 2014-06-07 DIAGNOSIS — L739 Follicular disorder, unspecified: Secondary | ICD-10-CM | POA: Insufficient documentation

## 2014-06-07 DIAGNOSIS — R062 Wheezing: Secondary | ICD-10-CM | POA: Insufficient documentation

## 2014-06-07 NOTE — Assessment & Plan Note (Addendum)
With sinus infection. Prescribed azithromycin for sinusitis and probable bronchitis

## 2014-06-07 NOTE — Assessment & Plan Note (Signed)
Qvar, albuterol and if worsens start prednisone.

## 2014-06-07 NOTE — Assessment & Plan Note (Signed)
Antibiotic should help. Pt thinks hives and I explained that it appears folliculitis rather than hives.

## 2014-06-20 ENCOUNTER — Other Ambulatory Visit: Payer: Self-pay

## 2014-06-20 ENCOUNTER — Telehealth: Payer: Self-pay

## 2014-06-20 MED ORDER — FLUTICASONE PROPIONATE 50 MCG/ACT NA SUSP: 2.0000 | Freq: Every day | NASAL | Status: DC

## 2014-06-20 NOTE — Telephone Encounter (Signed)
Christine [name] [phone] [place] said when she was in on the 11.5.15, and seen [name] she got her prescriptions but the one for fluticasone (FLONASE) 50 MCG/ACT nasal spray, was left out, so she would like to get that, it is much cheaper for her to get it as a prescription than buy over counter.

## 2014-06-20 NOTE — Telephone Encounter (Signed)
Medication e-scribed to pharmacy. Patient notified.

## 2014-10-11 ENCOUNTER — Telehealth: Payer: Self-pay | Admitting: Family

## 2014-10-12 NOTE — Telephone Encounter (Signed)
30 day supply xyzal sent to pharmacy. Pt last seen by PCP in 2014 and acute visit 06/2014. When should pt be seen for CPE or office visit?

## 2014-10-13 NOTE — Telephone Encounter (Signed)
She is due for cpx please.

## 2014-10-14 NOTE — Telephone Encounter (Signed)
Left message for patient to return my call.

## 2014-10-14 NOTE — Telephone Encounter (Signed)
Pt scheduled CPE for 11/19/14

## 2014-10-15 ENCOUNTER — Other Ambulatory Visit: Payer: Self-pay | Admitting: Obstetrics and Gynecology

## 2014-10-16 LAB — CYTOLOGY - PAP

## 2014-10-29 ENCOUNTER — Telehealth: Payer: Self-pay | Admitting: Family

## 2014-10-29 NOTE — Telephone Encounter (Signed)
Pre visit letter sent  °

## 2014-11-11 ENCOUNTER — Other Ambulatory Visit: Payer: Self-pay | Admitting: Family

## 2014-11-18 ENCOUNTER — Telehealth: Payer: Self-pay | Admitting: *Deleted

## 2014-11-18 NOTE — Telephone Encounter (Signed)
Unable to reach patient at time of Pre-Visit Call.  Left message for patient to return call when available.    

## 2014-11-19 ENCOUNTER — Encounter: Payer: Self-pay | Admitting: Family

## 2014-11-19 ENCOUNTER — Ambulatory Visit (INDEPENDENT_AMBULATORY_CARE_PROVIDER_SITE_OTHER): Payer: BLUE CROSS/BLUE SHIELD | Admitting: Family

## 2014-11-19 ENCOUNTER — Telehealth: Payer: Self-pay | Admitting: *Deleted

## 2014-11-19 VITALS — BP 122/86 | HR 69 | Temp 98.7°F | Resp 16 | Ht 68.5 in | Wt 220.0 lb

## 2014-11-19 DIAGNOSIS — Z0000 Encounter for general adult medical examination without abnormal findings: Secondary | ICD-10-CM

## 2014-11-19 DIAGNOSIS — G5601 Carpal tunnel syndrome, right upper limb: Secondary | ICD-10-CM

## 2014-11-19 DIAGNOSIS — M79672 Pain in left foot: Secondary | ICD-10-CM | POA: Diagnosis not present

## 2014-11-19 DIAGNOSIS — R7989 Other specified abnormal findings of blood chemistry: Secondary | ICD-10-CM | POA: Diagnosis not present

## 2014-11-19 DIAGNOSIS — G5600 Carpal tunnel syndrome, unspecified upper limb: Secondary | ICD-10-CM | POA: Insufficient documentation

## 2014-11-19 DIAGNOSIS — Z23 Encounter for immunization: Secondary | ICD-10-CM | POA: Diagnosis not present

## 2014-11-19 DIAGNOSIS — G5603 Carpal tunnel syndrome, bilateral upper limbs: Secondary | ICD-10-CM

## 2014-11-19 DIAGNOSIS — M79673 Pain in unspecified foot: Secondary | ICD-10-CM | POA: Insufficient documentation

## 2014-11-19 DIAGNOSIS — J309 Allergic rhinitis, unspecified: Secondary | ICD-10-CM | POA: Diagnosis not present

## 2014-11-19 DIAGNOSIS — G5602 Carpal tunnel syndrome, left upper limb: Secondary | ICD-10-CM

## 2014-11-19 DIAGNOSIS — J302 Other seasonal allergic rhinitis: Secondary | ICD-10-CM

## 2014-11-19 LAB — URINALYSIS, ROUTINE W REFLEX MICROSCOPIC
BILIRUBIN URINE: NEGATIVE
Hgb urine dipstick: NEGATIVE
Ketones, ur: NEGATIVE
Nitrite: NEGATIVE
Specific Gravity, Urine: 1.01 (ref 1.000–1.030)
TOTAL PROTEIN, URINE-UPE24: NEGATIVE
URINE GLUCOSE: NEGATIVE
Urobilinogen, UA: 0.2 (ref 0.0–1.0)
pH: 6.5 (ref 5.0–8.0)

## 2014-11-19 LAB — COMPREHENSIVE METABOLIC PANEL
ALBUMIN: 4.3 g/dL (ref 3.5–5.2)
ALT: 35 U/L (ref 0–35)
AST: 26 U/L (ref 0–37)
Alkaline Phosphatase: 68 U/L (ref 39–117)
BUN: 10 mg/dL (ref 6–23)
CO2: 27 meq/L (ref 19–32)
Calcium: 9.4 mg/dL (ref 8.4–10.5)
Chloride: 102 mEq/L (ref 96–112)
Creatinine, Ser: 0.68 mg/dL (ref 0.40–1.20)
GFR: 100.45 mL/min (ref >60.00)
Glucose, Bld: 101 mg/dL — ABNORMAL HIGH (ref 70–99)
Potassium: 4.1 mEq/L (ref 3.5–5.1)
Sodium: 134 mEq/L — ABNORMAL LOW (ref 135–145)
TOTAL PROTEIN: 7.4 g/dL (ref 6.0–8.3)
Total Bilirubin: 0.4 mg/dL (ref 0.2–1.2)

## 2014-11-19 LAB — TSH: TSH: 1.44 u[IU]/mL (ref 0.35–4.50)

## 2014-11-19 LAB — LIPID PANEL
CHOL/HDL RATIO: 5
CHOLESTEROL: 175 mg/dL (ref 0–200)
HDL: 34.2 mg/dL — ABNORMAL LOW (ref >39.00)
NonHDL: 140.8
TRIGLYCERIDES: 225 mg/dL — AB (ref 0.0–149.0)
VLDL: 45 mg/dL — AB (ref 0.0–40.0)

## 2014-11-19 LAB — LDL CHOLESTEROL, DIRECT: Direct LDL: 120 mg/dL

## 2014-11-19 MED ORDER — MELOXICAM 7.5 MG PO TABS: 7.5000 mg | ORAL_TABLET | Freq: Every day | ORAL | Status: DC

## 2014-11-19 MED ORDER — LEVOCETIRIZINE DIHYDROCHLORIDE 5 MG PO TABS: 5.0000 mg | ORAL_TABLET | Freq: Every evening | ORAL | Status: DC

## 2014-11-19 MED ORDER — AZELASTINE HCL 0.1 % NA SOLN: 2.0000 | Freq: Two times a day (BID) | NASAL | Status: DC

## 2014-11-19 NOTE — Assessment & Plan Note (Signed)
Immunizations reviewed due for tdap today.  Continue healthy diet, exercise weight loss efforts.  Obtain routine labs, mammogram. Pap up to date.

## 2014-11-19 NOTE — Assessment & Plan Note (Addendum)
Advised bilateral wrist braces and trial of ibuprofen.

## 2014-11-19 NOTE — Progress Notes (Signed)
Pre visit review using our clinic review tool, if applicable. No additional management support is needed unless otherwise documented below in the visit note. 

## 2014-11-19 NOTE — Progress Notes (Signed)
Subjective:    Christine [name] ID: Christine [name], female    DOB: 07/19/1971, 44 y.o.   MRN: [id number]  HPI  Christine [name] is a 44 yr old female who presents today for cpx.  Christine [name] presents today for complete physical.  Immunizations: due for tetanus Diet:Trying to eat a healthy diet Exercise:  Started this week.  Walking/tennis Pap Smear:  Up to date Mammogram: due  Allergic rhinitis- not improved with flonase and zyxal  Left foot pain- x > 1 year  Bilateral hand numbness- reports that she has had off/on for years. Worse with activities like brushing hair.  Review of Systems  Constitutional: Positive for unexpected weight change.       Wt Readings from Last 3 Encounters: 11/19/14 : 220 lb (99.791 kg) 06/06/14 : 216 lb 12.8 oz (98.34 kg) 03/29/14 : 213 lb 8 oz (96.843 kg)    HENT: Positive for rhinorrhea. Negative for hearing loss.   Eyes: Negative for visual disturbance.  Respiratory: Negative for cough.   Cardiovascular: Negative for leg swelling.  Gastrointestinal: Negative for nausea, diarrhea and constipation.  Genitourinary: Negative for dysuria and frequency.  Musculoskeletal: Negative for myalgias and arthralgias.  Skin: Negative for rash.  Neurological: Negative for headaches.  Hematological: Negative for adenopathy.  Psychiatric/Behavioral: Negative for dysphoric mood and agitation.   Past Medical History  Diagnosis Date  . Migraine, unspecified, without mention of intractable migraine without mention of status migrainosus   . Miscarriage 2007  . Diverticulitis     recurrent  . Allergy   . Asthma     Acute asthmatic bronchitis 2011 while pregnant  . GERD (gastroesophageal reflux disease)   . Thyroid disease   . PONV (postoperative nausea and vomiting)   . Family history of anesthesia complication     nausea and vomiting  . Hypothyroidism   . Depression   . Carpal tunnel syndrome     both wrist  . Anemia     stated h/o pernicious anemia     History   Social History  . Marital Status: Married    Spouse Name: N/A  . Number of Children: 2  . Years of Education: N/A   Occupational History  . Not on file.   Social History Main Topics  . Smoking status: Former Smoker -- 0.50 packs/day for 3 years    Types: Cigarettes    Quit date: 01/31/2000  . Smokeless tobacco: Never Used  . Alcohol Use: 1.2 oz/week    2 Glasses of wine per week     Comment: social  . Drug Use: No  . Sexual Activity: Not on file   Other Topics Concern  . Not on file   Social History Narrative   Married, 2 daughters   Occupation: Housewife   Daily Caffeine Use   Christine [name] does not get regular exercise.    Past Surgical History  Procedure Laterality Date  . Laparoscopy  5/94    x 2  . Dilation and curettage of uterus      miscarriage  . Sigmoidectomy      Hand-assisted laparoscopic sigmoidectomy with splenic flexure takedown.  . Cholecystectomy N/A 03/25/2014    Procedure: LAPAROSCOPIC CHOLECYSTECTOMY;  Surgeon: Ralene Ok, MD;  Location: [facility] OR;  Service: General;  Laterality: N/A;    Family History  Problem Relation Age of Onset  . Barrett's esophagus Mother   . Coronary artery disease Mother   . Congestive Heart Failure Mother   . Hypertension Mother   .  Fibromyalgia Mother   . Chronic fatigue Mother   . Anemia Mother   . Irritable bowel syndrome Mother   . Colon polyps Father   . Diabetes Father   . Hypertension Father   . Heart disease Father   . Colon cancer Neg Hx   . Alcohol abuse Other   . Arthritis Other   . Hyperlipidemia Other   . Hypertension Other   . Stroke Other   . Coronary artery disease Other   . Clotting disorder Other     Government social research officer  . Crohn's disease Cousin   . Diabetes Paternal Aunt   . Diabetes Paternal Grandfather   . Ehlers-Danlos syndrome Daughter   . Anemia Daughter   . Other Daughter     Postural orthostatic hypotension  . Irritable bowel syndrome Daughter     Allergies   Allergen Reactions  . Hydrocodone Nausea Only and Other (See Comments)    Hallucination   . Dilaudid [Hydromorphone Hcl]   . Latex Rash  . Penicillins Rash and Other (See Comments)    Childhood allergy    Current Outpatient Prescriptions on File Prior to Visit  Medication Sig Dispense Refill  . albuterol (PROVENTIL HFA;VENTOLIN HFA) 108 (90 BASE) MCG/ACT inhaler Inhale 2 puffs into the lungs every 6 (six) hours as needed for wheezing or shortness of breath. 1 Inhaler 0  . beclomethasone (QVAR) 40 MCG/ACT inhaler Inhale 2 puffs into the lungs 2 (two) times daily. 1 Inhaler 1  . escitalopram (LEXAPRO) 10 MG tablet Take 10 mg by mouth daily.    . fluticasone (FLONASE) 50 MCG/ACT nasal spray Place 2 sprays into both nostrils daily. 16 g 6  . levothyroxine (SYNTHROID) 100 MCG tablet Take 100 mcg by mouth daily before breakfast.    . omeprazole (PRILOSEC) 20 MG capsule Take 1 capsule (20 mg total) by mouth daily. 30 capsule 3   No current facility-administered medications on file prior to visit.    BP 122/86 mmHg  Pulse 69  Temp(Src) 98.7 F (37.1 C) (Oral)  Resp 16  Ht 5' 8.5" (1.74 m)  Wt 220 lb (99.791 kg)  BMI 32.96 kg/m2  SpO2 99%        Objective:   Physical Exam  Physical Exam  Constitutional: She is oriented to person, place, and time. She appears well-developed and well-nourished. No distress.  HENT:  Head: Normocephalic and atraumatic.  Right Ear: Tympanic membrane and ear canal normal.  Left Ear: Tympanic membrane and ear canal normal.  Mouth/Throat: Oropharynx is clear and moist.  Eyes: Pupils are equal, round, and reactive to light. No scleral icterus.  Neck: Normal range of motion. No thyromegaly present.  Cardiovascular: Normal rate and regular rhythm.   No murmur heard. Pulmonary/Chest: Effort normal and breath sounds normal. No respiratory distress. He has no wheezes. She has no rales. She exhibits no tenderness.  Abdominal: Soft. Bowel sounds are  normal. He exhibits no distension and no mass. There is no tenderness. There is no rebound and no guarding.  Musculoskeletal: She exhibits no edema.  Lymphadenopathy:    She has no cervical adenopathy.  Neurological: She is alert and oriented to person, place, and time. She has normal patellar reflexes. She exhibits normal muscle tone. Coordination normal. + tinel and + phalans testing bilaterally Skin: Skin is warm and dry.  Psychiatric: She has a normal mood and affect. Her behavior is normal. Judgment and thought content normal.  Breasts: Examined lying Right: Without masses, retractions, discharge or axillary  adenopathy.  Left: Without masses, retractions, discharge or axillary adenopathy. Pelvic:  deferred      Assessment & Plan:         Assessment & Plan:

## 2014-11-19 NOTE — Assessment & Plan Note (Signed)
Will refer to podiatry

## 2014-11-19 NOTE — Telephone Encounter (Signed)
Pt states she she wants to be referred back to allergist that Dr Shawna Orleans originally sent her to as she thinks she is now ready to try treatment for her allergies.  Looks like pt saw Dr Baird Lyons in 2009.  Please advise?

## 2014-11-19 NOTE — Patient Instructions (Addendum)
You will be contacted about your referral to podiatry.  Please let us know if you have not heard back within 1 week about your referral. Start astelin, continue flonase Start motrin 400mg  every 6 hours for next few days then as needed carpal tunnel.  Complete lab work prior to leaving. Schedule mammogram on the first floor.

## 2014-11-19 NOTE — Assessment & Plan Note (Signed)
Uncontrolled add astelin, continue flonase.

## 2014-11-22 ENCOUNTER — Ambulatory Visit (HOSPITAL_BASED_OUTPATIENT_CLINIC_OR_DEPARTMENT_OTHER)
Admission: RE | Admit: 2014-11-22 | Discharge: 2014-11-22 | Disposition: A | Discharge status: Home / Self Care | Payer: BLUE CROSS/BLUE SHIELD | Source: Ambulatory Visit | Attending: Family | Admitting: Family

## 2014-11-22 DIAGNOSIS — Z1231 Encounter for screening mammogram for malignant neoplasm of breast: Secondary | ICD-10-CM | POA: Diagnosis not present

## 2014-11-22 DIAGNOSIS — Z0000 Encounter for general adult medical examination without abnormal findings: Secondary | ICD-10-CM

## 2014-11-26 ENCOUNTER — Telehealth: Payer: Self-pay | Admitting: Family

## 2014-11-26 DIAGNOSIS — R739 Hyperglycemia, unspecified: Secondary | ICD-10-CM

## 2014-11-26 NOTE — Telephone Encounter (Signed)
Unable to add hgb a1c, Should pt return now to complete?

## 2014-11-26 NOTE — Telephone Encounter (Signed)
Yes please

## 2014-11-26 NOTE — Telephone Encounter (Signed)
Sugar was mildly elevated.  Can we please ask the lab to add on A1C. Please let pt know that also, trigs were elevated. Please work on avoiding concentrated sweets, and limiting white carbs (rice/bread/pasta/potatoes) and ensure regular exercise. Instead substitute whole grain versions with reasonable portions.repeat FLP 6  months, dx hypertriglyceridemia.  I would also like her to add fish oil 2000 mg bid.  Sodium mildly low- ok to liberalize sodium in her diet. Thyroid test looks good, continue synthroid 13mcg once daily.

## 2014-11-27 NOTE — Telephone Encounter (Signed)
Notified pt of below results. She will return to the lab on 11/29/14 for Hgb a1c at 10am. Scheduled pt 6 month f/u with PCP and to complete labs for 05/21/15 at 9:45am.

## 2014-11-27 NOTE — Telephone Encounter (Signed)
Left message for pt to return my call.

## 2014-11-27 NOTE — Telephone Encounter (Signed)
Pt returning your call, informed pt to schedule lab work pt stated she would like to speak with Gilmore Laroche first.

## 2014-11-29 ENCOUNTER — Encounter: Payer: Self-pay | Admitting: Family

## 2014-11-29 ENCOUNTER — Other Ambulatory Visit (INDEPENDENT_AMBULATORY_CARE_PROVIDER_SITE_OTHER): Payer: BLUE CROSS/BLUE SHIELD

## 2014-11-29 DIAGNOSIS — R739 Hyperglycemia, unspecified: Secondary | ICD-10-CM | POA: Diagnosis not present

## 2014-11-29 LAB — HEMOGLOBIN A1C: Hgb A1c MFr Bld: 5.5 % (ref 4.6–6.5)

## 2014-12-13 ENCOUNTER — Encounter: Payer: Self-pay | Admitting: Family

## 2015-01-28 ENCOUNTER — Institutional Professional Consult (permissible substitution): Payer: BLUE CROSS/BLUE SHIELD | Admitting: Internal Medicine

## 2015-05-21 ENCOUNTER — Ambulatory Visit (INDEPENDENT_AMBULATORY_CARE_PROVIDER_SITE_OTHER): Payer: BLUE CROSS/BLUE SHIELD | Admitting: Family

## 2015-05-21 ENCOUNTER — Encounter: Payer: Self-pay | Admitting: Family

## 2015-05-21 VITALS — BP 119/69 | HR 73 | Temp 99.1°F | Ht 69.0 in | Wt 225.6 lb

## 2015-05-21 DIAGNOSIS — E785 Hyperlipidemia, unspecified: Secondary | ICD-10-CM | POA: Diagnosis not present

## 2015-05-21 DIAGNOSIS — Z23 Encounter for immunization: Secondary | ICD-10-CM

## 2015-05-21 DIAGNOSIS — R5383 Other fatigue: Secondary | ICD-10-CM

## 2015-05-21 DIAGNOSIS — F32A Depression, unspecified: Secondary | ICD-10-CM | POA: Insufficient documentation

## 2015-05-21 DIAGNOSIS — F329 Major depressive disorder, single episode, unspecified: Secondary | ICD-10-CM | POA: Diagnosis not present

## 2015-05-21 DIAGNOSIS — E038 Other specified hypothyroidism: Secondary | ICD-10-CM

## 2015-05-21 LAB — HEPATIC FUNCTION PANEL
ALBUMIN: 4.2 g/dL (ref 3.5–5.2)
ALT: 30 U/L (ref 0–35)
AST: 21 U/L (ref 0–37)
Alkaline Phosphatase: 61 U/L (ref 39–117)
Bilirubin, Direct: 0 mg/dL (ref 0.0–0.3)
TOTAL PROTEIN: 7.3 g/dL (ref 6.0–8.3)
Total Bilirubin: 0.4 mg/dL (ref 0.2–1.2)

## 2015-05-21 LAB — CBC WITH DIFFERENTIAL/PLATELET
BASOS ABS: 0 10*3/uL (ref 0.0–0.1)
Basophils Relative: 0.8 % (ref 0.0–3.0)
EOS PCT: 4.7 % (ref 0.0–5.0)
Eosinophils Absolute: 0.2 10*3/uL (ref 0.0–0.7)
HEMATOCRIT: 39.5 % (ref 36.0–46.0)
HEMOGLOBIN: 13.3 g/dL (ref 12.0–15.0)
LYMPHS PCT: 30 % (ref 12.0–46.0)
Lymphs Abs: 1.5 10*3/uL (ref 0.7–4.0)
MCHC: 33.7 g/dL (ref 30.0–36.0)
MCV: 88.5 fl (ref 78.0–100.0)
MONOS PCT: 7.3 % (ref 3.0–12.0)
Monocytes Absolute: 0.4 10*3/uL (ref 0.1–1.0)
Neutro Abs: 2.9 10*3/uL (ref 1.4–7.7)
Neutrophils Relative %: 57.2 % (ref 43.0–77.0)
Platelets: 298 10*3/uL (ref 150.0–400.0)
RBC: 4.47 Mil/uL (ref 3.87–5.11)
RDW: 13 % (ref 11.5–15.5)
WBC: 5.1 10*3/uL (ref 4.0–10.5)

## 2015-05-21 LAB — LIPID PANEL
CHOLESTEROL: 181 mg/dL (ref 0–200)
HDL: 32.6 mg/dL — AB (ref >39.00)
LDL Cholesterol: 113 mg/dL — ABNORMAL HIGH (ref 0–99)
NonHDL: 148.57
Total CHOL/HDL Ratio: 6
Triglycerides: 180 mg/dL — ABNORMAL HIGH (ref 0.0–149.0)
VLDL: 36 mg/dL (ref 0.0–40.0)

## 2015-05-21 LAB — SEDIMENTATION RATE: Sed Rate: 10 mm/hr (ref 0–22)

## 2015-05-21 LAB — RHEUMATOID FACTOR: Rhuematoid fact SerPl-aCnc: <10 IU/mL (ref <=14)

## 2015-05-21 LAB — TSH: TSH: 0.58 u[IU]/mL (ref 0.35–4.50)

## 2015-05-21 NOTE — Assessment & Plan Note (Signed)
Continue synthroid, obtain TSH. 

## 2015-05-21 NOTE — Progress Notes (Signed)
Pre visit review using our clinic review tool, if applicable. No additional management support is needed unless otherwise documented below in the visit note. 

## 2015-05-21 NOTE — Patient Instructions (Signed)
Please complete lab work prior to leaving. Follow up in 3 months.  

## 2015-05-21 NOTE — Progress Notes (Signed)
Subjective:    Patient ID: Christine [name], female    DOB: 08-12-1970, 44 y.o.   MRN: [id number]  HPI   Christine [name] is a 44 yr old female who presents for  Lab Results  Component Value Date   CHOL 175 11/19/2014   HDL 34.20* 11/19/2014   LDLDIRECT 120.0 11/19/2014   TRIG 225.0* 11/19/2014   CHOLHDL 5 11/19/2014   Hypothyroid- reports + stress, seeing endocrinology.  Dr. Chalmers Cater is managing.  She has an upcoming apt in December. Reports + joint pain in knees hips.  Reports + muscle pain, soreness.    + mild snoring.  No witnessed apneas.      Depression- Reports lexapro has helped her mood.    Review of Systems    see HPI  Past Medical History  Diagnosis Date  . Migraine, unspecified, without mention of intractable migraine without mention of status migrainosus   . Miscarriage 2007  . Diverticulitis     recurrent  . Allergy   . Asthma     Acute asthmatic bronchitis 2011 while pregnant  . GERD (gastroesophageal reflux disease)   . Thyroid disease   . PONV (postoperative nausea and vomiting)   . Family history of anesthesia complication     nausea and vomiting  . Hypothyroidism   . Depression   . Carpal tunnel syndrome     both wrist  . Anemia     stated h/o pernicious anemia    Social History   Social History  . Marital Status: Married    Spouse Name: N/A  . Number of Children: 2  . Years of Education: N/A   Occupational History  . Not on file.   Social History Main Topics  . Smoking status: Former Smoker -- 0.50 packs/day for 3 years    Types: Cigarettes    Quit date: 01/31/2000  . Smokeless tobacco: Never Used  . Alcohol Use: 1.2 oz/week    2 Glasses of wine per week     Comment: social  . Drug Use: No  . Sexual Activity: Not on file   Other Topics Concern  . Not on file   Social History Narrative   Married, 2 daughters   Occupation: Housewife   Daily Caffeine Use   Patient does not get regular exercise.    Past Surgical History    Procedure Laterality Date  . Laparoscopy  5/94    x 2  . Dilation and curettage of uterus      miscarriage  . Sigmoidectomy      Hand-assisted laparoscopic sigmoidectomy with splenic flexure takedown.  . Cholecystectomy N/A 03/25/2014    Procedure: LAPAROSCOPIC CHOLECYSTECTOMY;  Surgeon: Ralene Ok, MD;  Location: [facility] OR;  Service: General;  Laterality: N/A;    Family History  Problem Relation Age of Onset  . Barrett's esophagus Mother   . Coronary artery disease Mother   . Congestive Heart Failure Mother   . Hypertension Mother   . Fibromyalgia Mother   . Chronic fatigue Mother   . Anemia Mother   . Irritable bowel syndrome Mother   . Colon polyps Father   . Diabetes Father   . Hypertension Father   . Heart disease Father   . Colon cancer Neg Hx   . Alcohol abuse Other   . Arthritis Other   . Hyperlipidemia Other   . Hypertension Other   . Stroke Other   . Coronary artery disease Other   . Clotting disorder Other  [employer]  . Crohn's disease Cousin   . Diabetes Paternal Aunt   . Diabetes Paternal Grandfather   . Ehlers-Danlos syndrome Daughter   . Anemia Daughter   . Other Daughter     Postural orthostatic hypotension  . Irritable bowel syndrome Daughter     Allergies  Allergen Reactions  . Hydrocodone Nausea Only and Other (See Comments)    Hallucination   . Dilaudid [Hydromorphone Hcl]   . Latex Rash  . Penicillins Rash and Other (See Comments)    Childhood allergy    Current Outpatient Prescriptions on File Prior to Visit  Medication Sig Dispense Refill  . Cyanocobalamin (B-12) 1000 MCG SUBL Place 1 each under the tongue daily.    Marland Kitchen escitalopram (LEXAPRO) 10 MG tablet Take 10 mg by mouth daily.    . fluticasone (FLONASE) 50 MCG/ACT nasal spray Place 2 sprays into both nostrils daily. 16 g 6  . levocetirizine (XYZAL) 5 MG tablet Take 1 tablet (5 mg total) by mouth every evening. 30 tablet 5  . levothyroxine (SYNTHROID) 100 MCG tablet  Take 100 mcg by mouth daily before breakfast.    . omeprazole (PRILOSEC) 20 MG capsule Take 1 capsule (20 mg total) by mouth daily. 30 capsule 3  . PAZEO 0.7 % SOLN 1 drop each eye in the morning as needed.  2  . Omega-3 Fatty Acids (FISH OIL) 1000 MG CAPS Take 2,000 mg by mouth 2 (two) times daily.     No current facility-administered medications on file prior to visit.    BP 119/69 mmHg  Pulse 73  Temp(Src) 99.1 F (37.3 C) (Oral)  Ht 5\' 9"  (1.753 m)  Wt 225 lb 9.6 oz (102.331 kg)  BMI 33.30 kg/m2  SpO2 99%     Objective:   Physical Exam  Constitutional: She is oriented to person, place, and time. She appears well-developed and well-nourished.  HENT:  Head: Normocephalic and atraumatic.  Cardiovascular: Normal rate and regular rhythm.   No murmur heard. Pulmonary/Chest: Effort normal and breath sounds normal. No respiratory distress. She has no wheezes. She has no rales. She exhibits no tenderness.  Musculoskeletal: She exhibits no edema.  Tender in 4/18 FM tenderpoints  Neurological: She is alert and oriented to person, place, and time.          Assessment & Plan:  Due to fatigue- will obtain autoimmune panel, LFT, CBC.

## 2015-05-21 NOTE — Assessment & Plan Note (Signed)
Obtain lipid panel

## 2015-05-21 NOTE — Assessment & Plan Note (Signed)
Reports improved mood on lexapro. Continue same.

## 2015-05-22 ENCOUNTER — Telehealth: Payer: Self-pay | Admitting: Family

## 2015-05-22 DIAGNOSIS — R768 Other specified abnormal immunological findings in serum: Secondary | ICD-10-CM

## 2015-05-22 LAB — ANA: Anti Nuclear Antibody(ANA): POSITIVE — AB

## 2015-05-22 LAB — ANTI-NUCLEAR AB-TITER (ANA TITER)

## 2015-05-22 NOTE — Telephone Encounter (Signed)
Spoke to pt. Reviewed lab tests and + ANA. She is agreeable to referral to rheumatology.    Gilmore Laroche, could you please fax copy of TSH to Dr. Chalmers Cater? Thanks.

## 2015-07-01 ENCOUNTER — Telehealth: Payer: Self-pay | Admitting: *Deleted

## 2015-07-01 ENCOUNTER — Other Ambulatory Visit: Payer: Self-pay | Admitting: Family

## 2015-07-01 MED ORDER — FLUTICASONE PROPIONATE 50 MCG/ACT NA SUSP: 2.0000 | Freq: Every day | NASAL | Status: DC

## 2015-07-01 NOTE — Telephone Encounter (Signed)
Received request for fluticasone 75mcg. 2 sprays each nostril daily.  Refills sent.

## 2015-08-22 ENCOUNTER — Encounter: Payer: Self-pay | Admitting: Family

## 2015-08-22 ENCOUNTER — Ambulatory Visit (INDEPENDENT_AMBULATORY_CARE_PROVIDER_SITE_OTHER): Payer: BLUE CROSS/BLUE SHIELD | Admitting: Family

## 2015-08-22 VITALS — BP 137/68 | HR 64 | Temp 99.3°F | Resp 18 | Ht 69.0 in | Wt 220.2 lb

## 2015-08-22 DIAGNOSIS — F329 Major depressive disorder, single episode, unspecified: Secondary | ICD-10-CM

## 2015-08-22 DIAGNOSIS — E785 Hyperlipidemia, unspecified: Secondary | ICD-10-CM

## 2015-08-22 DIAGNOSIS — E038 Other specified hypothyroidism: Secondary | ICD-10-CM | POA: Diagnosis not present

## 2015-08-22 DIAGNOSIS — F32A Depression, unspecified: Secondary | ICD-10-CM

## 2015-08-22 LAB — LIPID PANEL
Cholesterol: 185 mg/dL (ref 125–200)
HDL: 30 mg/dL — AB (ref >=46)
LDL Cholesterol: 111 mg/dL (ref <130)
Total CHOL/HDL Ratio: 6.2 Ratio — ABNORMAL HIGH (ref <=5.0)
Triglycerides: 222 mg/dL — ABNORMAL HIGH (ref <150)
VLDL: 44 mg/dL — ABNORMAL HIGH (ref <30)

## 2015-08-22 NOTE — Assessment & Plan Note (Signed)
Obtain follow up lipid panel.

## 2015-08-22 NOTE — Assessment & Plan Note (Signed)
Clinically stable, defer to endo. Pt has follow up in March.

## 2015-08-22 NOTE — Patient Instructions (Addendum)
Please complete lab work prior to leaving. Please follow up in May for your annual physical.

## 2015-08-22 NOTE — Progress Notes (Signed)
Subjective:    Patient ID: Christine [name], female    DOB: 19-Jun-1971, 45 y.o.   MRN: [id number]  HPI   1) Hypothyroid- patient follows with Dr. Chalmers Cater.  Sees her again in March. Feels ok on current dose of synthroid. Lab Results  Component Value Date   TSH 0.58 05/21/2015   2) Depression- stress careing for her daughter who has POTS and migraines. Daughter has paresthesias and weakness/difficulty ambulating.  Feels stressed but overall mood is good.  She continues lexapro.  3) Hyperlipidemia- not taking the fish oil due to the odor of the pills.   Lab Results  Component Value Date   CHOL 181 05/21/2015   HDL 32.60* 05/21/2015   LDLCALC 113* 05/21/2015   LDLDIRECT 120.0 11/19/2014   TRIG 180.0* 05/21/2015   CHOLHDL 6 05/21/2015     Review of Systems See HPI  Past Medical History  Diagnosis Date  . Migraine, unspecified, without mention of intractable migraine without mention of status migrainosus   . Miscarriage 2007  . Diverticulitis     recurrent  . Allergy   . Asthma     Acute asthmatic bronchitis 2011 while pregnant  . GERD (gastroesophageal reflux disease)   . Thyroid disease   . PONV (postoperative nausea and vomiting)   . Family history of anesthesia complication     nausea and vomiting  . Hypothyroidism   . Depression   . Carpal tunnel syndrome     both wrist  . Anemia     stated h/o pernicious anemia    Social History   Social History  . Marital Status: Married    Spouse Name: N/A  . Number of Children: 2  . Years of Education: N/A   Occupational History  . Not on file.   Social History Main Topics  . Smoking status: Former Smoker -- 0.50 packs/day for 3 years    Types: Cigarettes    Quit date: 01/31/2000  . Smokeless tobacco: Never Used  . Alcohol Use: 1.2 oz/week    2 Glasses of wine per week     Comment: social  . Drug Use: No  . Sexual Activity: Not on file   Other Topics Concern  . Not on file   Social History Narrative   Married, 2 daughters   Occupation: Housewife   Daily Caffeine Use   Patient does not get regular exercise.    Past Surgical History  Procedure Laterality Date  . Laparoscopy  5/94    x 2  . Dilation and curettage of uterus      miscarriage  . Sigmoidectomy      Hand-assisted laparoscopic sigmoidectomy with splenic flexure takedown.  . Cholecystectomy N/A 03/25/2014    Procedure: LAPAROSCOPIC CHOLECYSTECTOMY;  Surgeon: Ralene Ok, MD;  Location: [facility] OR;  Service: General;  Laterality: N/A;    Family History  Problem Relation Age of Onset  . Barrett's esophagus Mother   . Coronary artery disease Mother   . Congestive Heart Failure Mother   . Hypertension Mother   . Fibromyalgia Mother   . Chronic fatigue Mother   . Anemia Mother   . Irritable bowel syndrome Mother   . Colon polyps Father   . Diabetes Father   . Hypertension Father   . Heart disease Father   . Colon cancer Neg Hx   . Alcohol abuse Other   . Arthritis Other   . Hyperlipidemia Other   . Hypertension Other   . Stroke Other   .  Coronary artery disease Other   . Clotting disorder Other     Government social research officer  . Crohn's disease Cousin   . Diabetes Paternal Aunt   . Diabetes Paternal Grandfather   . Ehlers-Danlos syndrome Daughter   . Anemia Daughter   . Other Daughter     Postural orthostatic hypotension  . Irritable bowel syndrome Daughter     Allergies  Allergen Reactions  . Hydrocodone Nausea Only and Other (See Comments)    Hallucination   . Dilaudid [Hydromorphone Hcl]   . Latex Rash  . Penicillins Rash and Other (See Comments)    Childhood allergy    Current Outpatient Prescriptions on File Prior to Visit  Medication Sig Dispense Refill  . Cyanocobalamin (B-12) 1000 MCG SUBL Place 1 each under the tongue daily.    Marland Kitchen escitalopram (LEXAPRO) 10 MG tablet Take 10 mg by mouth daily.    . fluticasone (FLONASE) 50 MCG/ACT nasal spray Place 2 sprays into both nostrils daily. 16 g 6  .  levocetirizine (XYZAL) 5 MG tablet TAKE 1 TABLET(5 MG) BY MOUTH EVERY EVENING 30 tablet 6  . levothyroxine (SYNTHROID) 100 MCG tablet Take 100 mcg by mouth daily before breakfast.    . Omega-3 Fatty Acids (FISH OIL) 1000 MG CAPS Take 2,000 mg by mouth 2 (two) times daily.    Marland Kitchen omeprazole (PRILOSEC) 20 MG capsule Take 1 capsule (20 mg total) by mouth daily. 30 capsule 3  . PAZEO 0.7 % SOLN 1 drop each eye in the morning as needed.  2   No current facility-administered medications on file prior to visit.    BP 137/68 mmHg  Pulse 64  Temp(Src) 99.3 F (37.4 C) (Oral)  Resp 18  Ht 5\' 9"  (1.753 m)  Wt 220 lb 3.2 oz (99.882 kg)  BMI 32.50 kg/m2  SpO2 98%       Objective:   Physical Exam  Constitutional: She is oriented to person, place, and time. She appears well-developed and well-nourished.  HENT:  Head: Normocephalic and atraumatic.  Cardiovascular: Normal rate, regular rhythm and normal heart sounds.   No murmur heard. Pulmonary/Chest: Effort normal and breath sounds normal. No respiratory distress. She has no wheezes.  Musculoskeletal: She exhibits no edema.  Neurological: She is alert and oriented to person, place, and time.  Psychiatric: She has a normal mood and affect. Her behavior is normal. Judgment and thought content normal.          Assessment & Plan:

## 2015-08-22 NOTE — Progress Notes (Signed)
Pre visit review using our clinic review tool, if applicable. No additional management support is needed unless otherwise documented below in the visit note. 

## 2015-08-22 NOTE — Assessment & Plan Note (Signed)
Stable on lexapro, continue same.  

## 2015-08-24 ENCOUNTER — Encounter: Payer: Self-pay | Admitting: Family

## 2015-09-08 ENCOUNTER — Encounter: Payer: Self-pay | Admitting: Family

## 2015-11-24 DIAGNOSIS — G5601 Carpal tunnel syndrome, right upper limb: Secondary | ICD-10-CM | POA: Diagnosis not present

## 2015-11-24 DIAGNOSIS — R5381 Other malaise: Secondary | ICD-10-CM | POA: Diagnosis not present

## 2015-11-24 DIAGNOSIS — M797 Fibromyalgia: Secondary | ICD-10-CM | POA: Diagnosis not present

## 2015-11-24 DIAGNOSIS — M5030 Other cervical disc degeneration, unspecified cervical region: Secondary | ICD-10-CM | POA: Diagnosis not present

## 2015-11-27 DIAGNOSIS — M722 Plantar fascial fibromatosis: Secondary | ICD-10-CM | POA: Diagnosis not present

## 2015-12-03 ENCOUNTER — Ambulatory Visit (INDEPENDENT_AMBULATORY_CARE_PROVIDER_SITE_OTHER): Payer: BLUE CROSS/BLUE SHIELD | Admitting: Family

## 2015-12-03 ENCOUNTER — Encounter: Payer: Self-pay | Admitting: Family

## 2015-12-03 VITALS — BP 120/80 | HR 78 | Temp 98.3°F | Resp 16 | Ht 69.0 in | Wt 218.4 lb

## 2015-12-03 DIAGNOSIS — Z0000 Encounter for general adult medical examination without abnormal findings: Secondary | ICD-10-CM

## 2015-12-03 DIAGNOSIS — R7989 Other specified abnormal findings of blood chemistry: Secondary | ICD-10-CM | POA: Diagnosis not present

## 2015-12-03 LAB — CBC WITH DIFFERENTIAL/PLATELET
Basophils Absolute: 0 10*3/uL (ref 0.0–0.1)
Basophils Relative: 0.4 % (ref 0.0–3.0)
Eosinophils Absolute: 0.4 10*3/uL (ref 0.0–0.7)
Eosinophils Relative: 6.1 % — ABNORMAL HIGH (ref 0.0–5.0)
HCT: 39.7 % (ref 36.0–46.0)
Hemoglobin: 13.5 g/dL (ref 12.0–15.0)
Lymphocytes Relative: 26.3 % (ref 12.0–46.0)
Lymphs Abs: 1.5 10*3/uL (ref 0.7–4.0)
MCHC: 34.1 g/dL (ref 30.0–36.0)
MCV: 87.9 fl (ref 78.0–100.0)
Monocytes Absolute: 0.4 10*3/uL (ref 0.1–1.0)
Monocytes Relative: 6.6 % (ref 3.0–12.0)
Neutro Abs: 3.5 10*3/uL (ref 1.4–7.7)
Neutrophils Relative %: 60.6 % (ref 43.0–77.0)
Platelets: 295 10*3/uL (ref 150.0–400.0)
RBC: 4.51 Mil/uL (ref 3.87–5.11)
RDW: 12.9 % (ref 11.5–15.5)
WBC: 5.7 10*3/uL (ref 4.0–10.5)

## 2015-12-03 LAB — URINALYSIS, ROUTINE W REFLEX MICROSCOPIC
Bilirubin Urine: NEGATIVE
Hgb urine dipstick: NEGATIVE
Ketones, ur: NEGATIVE
LEUKOCYTES UA: NEGATIVE
Nitrite: NEGATIVE
Total Protein, Urine: NEGATIVE
URINE GLUCOSE: NEGATIVE
Urobilinogen, UA: 0.2 (ref 0.0–1.0)
pH: 6.5 (ref 5.0–8.0)

## 2015-12-03 LAB — BASIC METABOLIC PANEL
BUN: 12 mg/dL (ref 6–23)
CO2: 25 mEq/L (ref 19–32)
Calcium: 9.5 mg/dL (ref 8.4–10.5)
Chloride: 101 mEq/L (ref 96–112)
Creatinine, Ser: 0.67 mg/dL (ref 0.40–1.20)
GFR: 101.69 mL/min (ref >60.00)
Glucose, Bld: 100 mg/dL — ABNORMAL HIGH (ref 70–99)
Potassium: 4.1 mEq/L (ref 3.5–5.1)
Sodium: 135 mEq/L (ref 135–145)

## 2015-12-03 LAB — TSH: TSH: 1.46 u[IU]/mL (ref 0.35–4.50)

## 2015-12-03 LAB — LIPID PANEL
Cholesterol: 198 mg/dL (ref 0–200)
HDL: 32.6 mg/dL — AB (ref >39.00)
NONHDL: 165.21
TRIGLYCERIDES: 324 mg/dL — AB (ref 0.0–149.0)
Total CHOL/HDL Ratio: 6
VLDL: 64.8 mg/dL — ABNORMAL HIGH (ref 0.0–40.0)

## 2015-12-03 LAB — LDL CHOLESTEROL, DIRECT: LDL DIRECT: 135 mg/dL

## 2015-12-03 LAB — HEPATIC FUNCTION PANEL
ALT: 26 U/L (ref 0–35)
AST: 20 U/L (ref 0–37)
Albumin: 4.4 g/dL (ref 3.5–5.2)
Alkaline Phosphatase: 57 U/L (ref 39–117)
BILIRUBIN DIRECT: 0.1 mg/dL (ref 0.0–0.3)
BILIRUBIN TOTAL: 0.4 mg/dL (ref 0.2–1.2)
TOTAL PROTEIN: 7.6 g/dL (ref 6.0–8.3)

## 2015-12-03 NOTE — Progress Notes (Signed)
Pre visit review using our clinic review tool, if applicable. No additional management support is needed unless otherwise documented below in the visit note. 

## 2015-12-03 NOTE — Assessment & Plan Note (Signed)
Discussed healthy diet, exercise, weight loss. Refer for mammogram. Obtain routine lab work.

## 2015-12-03 NOTE — Patient Instructions (Addendum)
Please continue to work healthy diet, exercise, and weight loss. Follow up in 6 months.   Preventive Care for Adults, Female A healthy lifestyle and preventive care can promote health and wellness. Preventive health guidelines for women include the following key practices.  A routine yearly physical is a good way to check with your health care provider about your health and preventive screening. It is a chance to share any concerns and updates on your health and to receive a thorough exam.  Visit your dentist for a routine exam and preventive care every 6 months. Brush your teeth twice a day and floss once a day. Good oral hygiene prevents tooth decay and gum disease.  The frequency of eye exams is based on your age, health, family medical history, use of contact lenses, and other factors. Follow your health care provider's recommendations for frequency of eye exams.  Eat a healthy diet. Foods like vegetables, fruits, whole grains, low-fat dairy products, and lean protein foods contain the nutrients you need without too many calories. Decrease your intake of foods high in solid fats, added sugars, and salt. Eat the right amount of calories for you.Get information about a proper diet from your health care provider, if necessary.  Regular physical exercise is one of the most important things you can do for your health. Most adults should get at least 150 minutes of moderate-intensity exercise (any activity that increases your heart rate and causes you to sweat) each week. In addition, most adults need muscle-strengthening exercises on 2 or more days a week.  Maintain a healthy weight. The body mass index (BMI) is a screening tool to identify possible weight problems. It provides an estimate of body fat based on height and weight. Your health care provider can find your BMI and can help you achieve or maintain a healthy weight.For adults 20 years and older:  A BMI below 18.5 is considered  underweight.  A BMI of 18.5 to 24.9 is normal.  A BMI of 25 to 29.9 is considered overweight.  A BMI of 30 and above is considered obese.  Maintain normal blood lipids and cholesterol levels by exercising and minimizing your intake of saturated fat. Eat a balanced diet with plenty of fruit and vegetables. Blood tests for lipids and cholesterol should begin at age 65 and be repeated every 5 years. If your lipid or cholesterol levels are high, you are over 50, or you are at high risk for heart disease, you may need your cholesterol levels checked more frequently.Ongoing high lipid and cholesterol levels should be treated with medicines if diet and exercise are not working.  If you smoke, find out from your health care provider how to quit. If you do not use tobacco, do not start.  Lung cancer screening is recommended for adults aged 51-80 years who are at high risk for developing lung cancer because of a history of smoking. A yearly low-dose CT scan of the lungs is recommended for people who have at least a 30-pack-year history of smoking and are a current smoker or have quit within the past 15 years. A pack year of smoking is smoking an average of 1 pack of cigarettes a day for 1 year (for example: 1 pack a day for 30 years or 2 packs a day for 15 years). Yearly screening should continue until the smoker has stopped smoking for at least 15 years. Yearly screening should be stopped for people who develop a health problem that would prevent them  from having lung cancer treatment.  If you are pregnant, do not drink alcohol. If you are breastfeeding, be very cautious about drinking alcohol. If you are not pregnant and choose to drink alcohol, do not have more than 1 drink per day. One drink is considered to be 12 ounces (355 mL) of beer, 5 ounces (148 mL) of wine, or 1.5 ounces (44 mL) of liquor.  Avoid use of street drugs. Do not share needles with anyone. Ask for help if you need support or  instructions about stopping the use of drugs.  High blood pressure causes heart disease and increases the risk of stroke. Your blood pressure should be checked at least every 1 to 2 years. Ongoing high blood pressure should be treated with medicines if weight loss and exercise do not work.  If you are 55-79 years old, ask your health care provider if you should take aspirin to prevent strokes.  Diabetes screening is done by taking a blood sample to check your blood glucose level after you have not eaten for a certain period of time (fasting). If you are not overweight and you do not have risk factors for diabetes, you should be screened once every 3 years starting at age 45. If you are overweight or obese and you are 40-70 years of age, you should be screened for diabetes every year as part of your cardiovascular risk assessment.  Breast cancer screening is essential preventive care for women. You should practice "breast self-awareness." This means understanding the normal appearance and feel of your breasts and may include breast self-examination. Any changes detected, no matter how small, should be reported to a health care provider. Women in their 20s and 30s should have a clinical breast exam (CBE) by a health care provider as part of a regular health exam every 1 to 3 years. After age 40, women should have a CBE every year. Starting at age 40, women should consider having a mammogram (breast X-ray test) every year. Women who have a family history of breast cancer should talk to their health care provider about genetic screening. Women at a high risk of breast cancer should talk to their health care providers about having an MRI and a mammogram every year.  Breast cancer gene (BRCA)-related cancer risk assessment is recommended for women who have family members with BRCA-related cancers. BRCA-related cancers include breast, ovarian, tubal, and peritoneal cancers. Having family members with these  cancers may be associated with an increased risk for harmful changes (mutations) in the breast cancer genes BRCA1 and BRCA2. Results of the assessment will determine the need for genetic counseling and BRCA1 and BRCA2 testing.  Your health care provider may recommend that you be screened regularly for cancer of the pelvic organs (ovaries, uterus, and vagina). This screening involves a pelvic examination, including checking for microscopic changes to the surface of your cervix (Pap test). You may be encouraged to have this screening done every 3 years, beginning at age 21.  For women ages 30-65, health care providers may recommend pelvic exams and Pap testing every 3 years, or they may recommend the Pap and pelvic exam, combined with testing for human papilloma virus (HPV), every 5 years. Some types of HPV increase your risk of cervical cancer. Testing for HPV may also be done on women of any age with unclear Pap test results.  Other health care providers may not recommend any screening for nonpregnant women who are considered low risk for pelvic cancer and who   do not have symptoms. Ask your health care provider if a screening pelvic exam is right for you.  If you have had past treatment for cervical cancer or a condition that could lead to cancer, you need Pap tests and screening for cancer for at least 20 years after your treatment. If Pap tests have been discontinued, your risk factors (such as having a new sexual partner) need to be reassessed to determine if screening should resume. Some women have medical problems that increase the chance of getting cervical cancer. In these cases, your health care provider may recommend more frequent screening and Pap tests.  Colorectal cancer can be detected and often prevented. Most routine colorectal cancer screening begins at the age of 50 years and continues through age 75 years. However, your health care provider may recommend screening at an earlier age if you  have risk factors for colon cancer. On a yearly basis, your health care provider may provide home test kits to check for hidden blood in the stool. Use of a small camera at the end of a tube, to directly examine the colon (sigmoidoscopy or colonoscopy), can detect the earliest forms of colorectal cancer. Talk to your health care provider about this at age 50, when routine screening begins. Direct exam of the colon should be repeated every 5-10 years through age 75 years, unless early forms of precancerous polyps or small growths are found.  People who are at an increased risk for hepatitis B should be screened for this virus. You are considered at high risk for hepatitis B if:  You were born in a country where hepatitis B occurs often. Talk with your health care provider about which countries are considered high risk.  Your parents were born in a high-risk country and you have not received a shot to protect against hepatitis B (hepatitis B vaccine).  You have HIV or AIDS.  You use needles to inject street drugs.  You live with, or have sex with, someone who has hepatitis B.  You get hemodialysis treatment.  You take certain medicines for conditions like cancer, organ transplantation, and autoimmune conditions.  Hepatitis C blood testing is recommended for all people born from 1945 through 1965 and any individual with known risks for hepatitis C.  Practice safe sex. Use condoms and avoid high-risk sexual practices to reduce the spread of sexually transmitted infections (STIs). STIs include gonorrhea, chlamydia, syphilis, trichomonas, herpes, HPV, and human immunodeficiency virus (HIV). Herpes, HIV, and HPV are viral illnesses that have no cure. They can result in disability, cancer, and death.  You should be screened for sexually transmitted illnesses (STIs) including gonorrhea and chlamydia if:  You are sexually active and are younger than 24 years.  You are older than 24 years and your  health care provider tells you that you are at risk for this type of infection.  Your sexual activity has changed since you were last screened and you are at an increased risk for chlamydia or gonorrhea. Ask your health care provider if you are at risk.  If you are at risk of being infected with HIV, it is recommended that you take a prescription medicine daily to prevent HIV infection. This is called preexposure prophylaxis (PrEP). You are considered at risk if:  You are sexually active and do not regularly use condoms or know the HIV status of your partner(s).  You take drugs by injection.  You are sexually active with a partner who has HIV.  Talk with   your health care provider about whether you are at high risk of being infected with HIV. If you choose to begin PrEP, you should first be tested for HIV. You should then be tested every 3 months for as long as you are taking PrEP.  Osteoporosis is a disease in which the bones lose minerals and strength with aging. This can result in serious bone fractures or breaks. The risk of osteoporosis can be identified using a bone density scan. Women ages 87 years and over and women at risk for fractures or osteoporosis should discuss screening with their health care providers. Ask your health care provider whether you should take a calcium supplement or vitamin D to reduce the rate of osteoporosis.  Menopause can be associated with physical symptoms and risks. Hormone replacement therapy is available to decrease symptoms and risks. You should talk to your health care provider about whether hormone replacement therapy is right for you.  Use sunscreen. Apply sunscreen liberally and repeatedly throughout the day. You should seek shade when your shadow is shorter than you. Protect yourself by wearing long sleeves, pants, a wide-brimmed hat, and sunglasses year round, whenever you are outdoors.  Once a month, do a whole body skin exam, using a mirror to look  at the skin on your back. Tell your health care provider of new moles, moles that have irregular borders, moles that are larger than a pencil eraser, or moles that have changed in shape or color.  Stay current with required vaccines (immunizations).  Influenza vaccine. All adults should be immunized every year.  Tetanus, diphtheria, and acellular pertussis (Td, Tdap) vaccine. Pregnant women should receive 1 dose of Tdap vaccine during each pregnancy. The dose should be obtained regardless of the length of time since the last dose. Immunization is preferred during the 27th-36th week of gestation. An adult who has not previously received Tdap or who does not know her vaccine status should receive 1 dose of Tdap. This initial dose should be followed by tetanus and diphtheria toxoids (Td) booster doses every 10 years. Adults with an unknown or incomplete history of completing a 3-dose immunization series with Td-containing vaccines should begin or complete a primary immunization series including a Tdap dose. Adults should receive a Td booster every 10 years.  Varicella vaccine. An adult without evidence of immunity to varicella should receive 2 doses or a second dose if she has previously received 1 dose. Pregnant females who do not have evidence of immunity should receive the first dose after pregnancy. This first dose should be obtained before leaving the health care facility. The second dose should be obtained 4-8 weeks after the first dose.  Human papillomavirus (HPV) vaccine. Females aged 13-26 years who have not received the vaccine previously should obtain the 3-dose series. The vaccine is not recommended for use in pregnant females. However, pregnancy testing is not needed before receiving a dose. If a female is found to be pregnant after receiving a dose, no treatment is needed. In that case, the remaining doses should be delayed until after the pregnancy. Immunization is recommended for any person  with an immunocompromised condition through the age of 24 years if she did not get any or all doses earlier. During the 3-dose series, the second dose should be obtained 4-8 weeks after the first dose. The third dose should be obtained 24 weeks after the first dose and 16 weeks after the second dose.  Zoster vaccine. One dose is recommended for adults aged 45  years or older unless certain conditions are present.  Measles, mumps, and rubella (MMR) vaccine. Adults born before 1957 generally are considered immune to measles and mumps. Adults born in 1957 or later should have 1 or more doses of MMR vaccine unless there is a contraindication to the vaccine or there is laboratory evidence of immunity to each of the three diseases. A routine second dose of MMR vaccine should be obtained at least 28 days after the first dose for students attending postsecondary schools, health care workers, or international travelers. People who received inactivated measles vaccine or an unknown type of measles vaccine during 1963-1967 should receive 2 doses of MMR vaccine. People who received inactivated mumps vaccine or an unknown type of mumps vaccine before 1979 and are at high risk for mumps infection should consider immunization with 2 doses of MMR vaccine. For females of childbearing age, rubella immunity should be determined. If there is no evidence of immunity, females who are not pregnant should be vaccinated. If there is no evidence of immunity, females who are pregnant should delay immunization until after pregnancy. Unvaccinated health care workers born before 1957 who lack laboratory evidence of measles, mumps, or rubella immunity or laboratory confirmation of disease should consider measles and mumps immunization with 2 doses of MMR vaccine or rubella immunization with 1 dose of MMR vaccine.  Pneumococcal 13-valent conjugate (PCV13) vaccine. When indicated, a person who is uncertain of his immunization history and has  no record of immunization should receive the PCV13 vaccine. All adults 65 years of age and older should receive this vaccine. An adult aged 19 years or older who has certain medical conditions and has not been previously immunized should receive 1 dose of PCV13 vaccine. This PCV13 should be followed with a dose of pneumococcal polysaccharide (PPSV23) vaccine. Adults who are at high risk for pneumococcal disease should obtain the PPSV23 vaccine at least 8 weeks after the dose of PCV13 vaccine. Adults older than 44 years of age who have normal immune system function should obtain the PPSV23 vaccine dose at least 1 year after the dose of PCV13 vaccine.  Pneumococcal polysaccharide (PPSV23) vaccine. When PCV13 is also indicated, PCV13 should be obtained first. All adults aged 65 years and older should be immunized. An adult younger than age 65 years who has certain medical conditions should be immunized. Any person who resides in a nursing home or long-term care facility should be immunized. An adult smoker should be immunized. People with an immunocompromised condition and certain other conditions should receive both PCV13 and PPSV23 vaccines. People with human immunodeficiency virus (HIV) infection should be immunized as soon as possible after diagnosis. Immunization during chemotherapy or radiation therapy should be avoided. Routine use of PPSV23 vaccine is not recommended for American Indians, [place] Natives, or people younger than 65 years unless there are medical conditions that require PPSV23 vaccine. When indicated, people who have unknown immunization and have no record of immunization should receive PPSV23 vaccine. One-time revaccination 5 years after the first dose of PPSV23 is recommended for people aged 19-64 years who have chronic kidney failure, nephrotic syndrome, asplenia, or immunocompromised conditions. People who received 1-2 doses of PPSV23 before age 65 years should receive another dose of PPSV23  vaccine at age 65 years or later if at least 5 years have passed since the previous dose. Doses of PPSV23 are not needed for people immunized with PPSV23 at or after age 65 years.  Meningococcal vaccine. Adults with asplenia or persistent complement   component deficiencies should receive 2 doses of quadrivalent meningococcal conjugate (MenACWY-D) vaccine. The doses should be obtained at least 2 months apart. Microbiologists working with certain meningococcal bacteria, military recruits, people at risk during an outbreak, and people who travel to or live in countries with a high rate of meningitis should be immunized. A first-year college student up through age 21 years who is living in a residence hall should receive a dose if she did not receive a dose on or after her 16th birthday. Adults who have certain high-risk conditions should receive one or more doses of vaccine.  Hepatitis A vaccine. Adults who wish to be protected from this disease, have certain high-risk conditions, work with hepatitis A-infected animals, work in hepatitis A research labs, or travel to or work in countries with a high rate of hepatitis A should be immunized. Adults who were previously unvaccinated and who anticipate close contact with an international adoptee during the first 60 days after arrival in the [place] from a country with a high rate of hepatitis A should be immunized.  Hepatitis B vaccine. Adults who wish to be protected from this disease, have certain high-risk conditions, may be exposed to blood or other infectious body fluids, are household contacts or sex partners of hepatitis B positive people, are clients or workers in certain care facilities, or travel to or work in countries with a high rate of hepatitis B should be immunized.  Haemophilus influenzae type b (Hib) vaccine. A previously unvaccinated person with asplenia or sickle cell disease or having a scheduled splenectomy should receive 1 dose of Hib  vaccine. Regardless of previous immunization, a recipient of a hematopoietic stem cell transplant should receive a 3-dose series 6-12 months after her successful transplant. Hib vaccine is not recommended for adults with HIV infection. Preventive Services / Frequency Ages 19 to 39 years  Blood pressure check.** / Every 3-5 years.  Lipid and cholesterol check.** / Every 5 years beginning at age 20.  Clinical breast exam.** / Every 3 years for women in their 20s and 30s.  BRCA-related cancer risk assessment.** / For women who have family members with a BRCA-related cancer (breast, ovarian, tubal, or peritoneal cancers).  Pap test.** / Every 2 years from ages 21 through 29. Every 3 years starting at age 30 through age 65 or 70 with a history of 3 consecutive normal Pap tests.  HPV screening.** / Every 3 years from ages 30 through ages 65 to 70 with a history of 3 consecutive normal Pap tests.  Hepatitis C blood test.** / For any individual with known risks for hepatitis C.  Skin self-exam. / Monthly.  Influenza vaccine. / Every year.  Tetanus, diphtheria, and acellular pertussis (Tdap, Td) vaccine.** / Consult your health care provider. Pregnant women should receive 1 dose of Tdap vaccine during each pregnancy. 1 dose of Td every 10 years.  Varicella vaccine.** / Consult your health care provider. Pregnant females who do not have evidence of immunity should receive the first dose after pregnancy.  HPV vaccine. / 3 doses over 6 months, if 26 and younger. The vaccine is not recommended for use in pregnant females. However, pregnancy testing is not needed before receiving a dose.  Measles, mumps, rubella (MMR) vaccine.** / You need at least 1 dose of MMR if you were born in 1957 or later. You may also need a 2nd dose. For females of childbearing age, rubella immunity should be determined. If there is no evidence of immunity, females   who are not pregnant should be vaccinated. If there is no  evidence of immunity, females who are pregnant should delay immunization until after pregnancy.  Pneumococcal 13-valent conjugate (PCV13) vaccine.** / Consult your health care provider.  Pneumococcal polysaccharide (PPSV23) vaccine.** / 1 to 2 doses if you smoke cigarettes or if you have certain conditions.  Meningococcal vaccine.** / 1 dose if you are age 19 to 21 years and a first-year college student living in a residence hall, or have one of several medical conditions, you need to get vaccinated against meningococcal disease. You may also need additional booster doses.  Hepatitis A vaccine.** / Consult your health care provider.  Hepatitis B vaccine.** / Consult your health care provider.  Haemophilus influenzae type b (Hib) vaccine.** / Consult your health care provider. Ages 40 to 64 years  Blood pressure check.** / Every year.  Lipid and cholesterol check.** / Every 5 years beginning at age 20 years.  Lung cancer screening. / Every year if you are aged 55-80 years and have a 30-pack-year history of smoking and currently smoke or have quit within the past 15 years. Yearly screening is stopped once you have quit smoking for at least 15 years or develop a health problem that would prevent you from having lung cancer treatment.  Clinical breast exam.** / Every year after age 40 years.  BRCA-related cancer risk assessment.** / For women who have family members with a BRCA-related cancer (breast, ovarian, tubal, or peritoneal cancers).  Mammogram.** / Every year beginning at age 40 years and continuing for as long as you are in good health. Consult with your health care provider.  Pap test.** / Every 3 years starting at age 30 years through age 65 or 70 years with a history of 3 consecutive normal Pap tests.  HPV screening.** / Every 3 years from ages 30 years through ages 65 to 70 years with a history of 3 consecutive normal Pap tests.  Fecal occult blood test (FOBT) of stool. /  Every year beginning at age 50 years and continuing until age 75 years. You may not need to do this test if you get a colonoscopy every 10 years.  Flexible sigmoidoscopy or colonoscopy.** / Every 5 years for a flexible sigmoidoscopy or every 10 years for a colonoscopy beginning at age 50 years and continuing until age 75 years.  Hepatitis C blood test.** / For all people born from 1945 through 1965 and any individual with known risks for hepatitis C.  Skin self-exam. / Monthly.  Influenza vaccine. / Every year.  Tetanus, diphtheria, and acellular pertussis (Tdap/Td) vaccine.** / Consult your health care provider. Pregnant women should receive 1 dose of Tdap vaccine during each pregnancy. 1 dose of Td every 10 years.  Varicella vaccine.** / Consult your health care provider. Pregnant females who do not have evidence of immunity should receive the first dose after pregnancy.  Zoster vaccine.** / 1 dose for adults aged 60 years or older.  Measles, mumps, rubella (MMR) vaccine.** / You need at least 1 dose of MMR if you were born in 1957 or later. You may also need a second dose. For females of childbearing age, rubella immunity should be determined. If there is no evidence of immunity, females who are not pregnant should be vaccinated. If there is no evidence of immunity, females who are pregnant should delay immunization until after pregnancy.  Pneumococcal 13-valent conjugate (PCV13) vaccine.** / Consult your health care provider.  Pneumococcal polysaccharide (PPSV23) vaccine.** / 1   to 2 doses if you smoke cigarettes or if you have certain conditions.  Meningococcal vaccine.** / Consult your health care provider.  Hepatitis A vaccine.** / Consult your health care provider.  Hepatitis B vaccine.** / Consult your health care provider.  Haemophilus influenzae type b (Hib) vaccine.** / Consult your health care provider. Ages 57 years and over  Blood pressure check.** / Every year.  Lipid  and cholesterol check.** / Every 5 years beginning at age 85 years.  Lung cancer screening. / Every year if you are aged 38-80 years and have a 30-pack-year history of smoking and currently smoke or have quit within the past 15 years. Yearly screening is stopped once you have quit smoking for at least 15 years or develop a health problem that would prevent you from having lung cancer treatment.  Clinical breast exam.** / Every year after age 33 years.  BRCA-related cancer risk assessment.** / For women who have family members with a BRCA-related cancer (breast, ovarian, tubal, or peritoneal cancers).  Mammogram.** / Every year beginning at age 35 years and continuing for as long as you are in good health. Consult with your health care provider.  Pap test.** / Every 3 years starting at age 14 years through age 32 or 29 years with 3 consecutive normal Pap tests. Testing can be stopped between 65 and 70 years with 3 consecutive normal Pap tests and no abnormal Pap or HPV tests in the past 10 years.  HPV screening.** / Every 3 years from ages 56 years through ages 67 or 38 years with a history of 3 consecutive normal Pap tests. Testing can be stopped between 65 and 70 years with 3 consecutive normal Pap tests and no abnormal Pap or HPV tests in the past 10 years.  Fecal occult blood test (FOBT) of stool. / Every year beginning at age 38 years and continuing until age 68 years. You may not need to do this test if you get a colonoscopy every 10 years.  Flexible sigmoidoscopy or colonoscopy.** / Every 5 years for a flexible sigmoidoscopy or every 10 years for a colonoscopy beginning at age 44 years and continuing until age 26 years.  Hepatitis C blood test.** / For all people born from [birth year] through 1964 and any individual with known risks for hepatitis C.  Osteoporosis screening.** / A one-time screening for women ages 22 years and over and women at risk for fractures or osteoporosis.  Skin  self-exam. / Monthly.  Influenza vaccine. / Every year.  Tetanus, diphtheria, and acellular pertussis (Tdap/Td) vaccine.** / 1 dose of Td every 10 years.  Varicella vaccine.** / Consult your health care provider.  Zoster vaccine.** / 1 dose for adults aged 9 years or older.  Pneumococcal 13-valent conjugate (PCV13) vaccine.** / Consult your health care provider.  Pneumococcal polysaccharide (PPSV23) vaccine.** / 1 dose for all adults aged 33 years and older.  Meningococcal vaccine.** / Consult your health care provider.  Hepatitis A vaccine.** / Consult your health care provider.  Hepatitis B vaccine.** / Consult your health care provider.  Haemophilus influenzae type b (Hib) vaccine.** / Consult your health care provider. ** Family history and personal history of risk and conditions may change your health care provider's recommendations.   This information is not intended to replace advice given to you by your health care provider. Make sure you discuss any questions you have with your health care provider.   Document Released: 09/14/2001 Document Revised: 08/09/2014 Document Reviewed: 12/14/2010 Elsevier Interactive  Patient Education 2016 [employer].

## 2015-12-03 NOTE — Progress Notes (Signed)
Subjective:    Patient ID: Christine [name], female    DOB: 1971/01/03, 45 y.o.   MRN: [id number]  HPI  Patient presents today for complete physical.  Immunizations:  Tetanus up to date Diet: water, rare diet coke.  Wt Readings from Last 3 Encounters:  12/03/15 218 lb 6.4 oz (99.066 kg)  08/22/15 220 lb 3.2 oz (99.882 kg)  05/21/15 225 lb 9.6 oz (102.331 kg)  Exercise: started walking again, some jogging.  Pap Smear:3/16 Mammogram: 4/16- due Dental: up to date Vision: up to date  Lab Results  Component Value Date   HGBA1C 5.5 11/29/2014   Saw Dr. Estanislado Pandy- reports that lupus testing was negative but that she has fibromyalgia.    Review of Systems  Constitutional: Negative for unexpected weight change.  HENT: Positive for rhinorrhea. Negative for hearing loss.   Eyes: Negative for visual disturbance.  Respiratory: Negative for cough.   Cardiovascular:       Reports + LE swelling/pitting.   Gastrointestinal: Negative for diarrhea and constipation.  Genitourinary: Negative for dysuria and frequency.       Working with GYN re: bladder leakage  Musculoskeletal: Positive for myalgias.  Skin: Negative for rash.  Neurological: Positive for headaches.  Hematological: Negative for adenopathy.  Psychiatric/Behavioral:       Denies depression/anxiety currently   Past Medical History  Diagnosis Date  . Migraine, unspecified, without mention of intractable migraine without mention of status migrainosus   . Miscarriage 2007  . Diverticulitis     recurrent  . Allergy   . Asthma     Acute asthmatic bronchitis 2011 while pregnant  . GERD (gastroesophageal reflux disease)   . Thyroid disease   . PONV (postoperative nausea and vomiting)   . Family history of anesthesia complication     nausea and vomiting  . Hypothyroidism   . Depression   . Carpal tunnel syndrome     both wrist  . Anemia     stated h/o pernicious anemia  . Fibromyalgia   . Arthritis    osteoarthritis     Social History   Social History  . Marital Status: Married    Spouse Name: N/A  . Number of Children: 2  . Years of Education: N/A   Occupational History  . Not on file.   Social History Main Topics  . Smoking status: Former Smoker -- 0.50 packs/day for 3 years    Types: Cigarettes    Quit date: 01/31/2000  . Smokeless tobacco: Never Used  . Alcohol Use: 1.2 oz/week    2 Glasses of wine per week     Comment: social  . Drug Use: No  . Sexual Activity: Not on file   Other Topics Concern  . Not on file   Social History Narrative   Married, 2 daughters   Occupation: Housewife   Daily Caffeine Use   Patient does not get regular exercise.    Past Surgical History  Procedure Laterality Date  . Laparoscopy  5/94    x 2  . Dilation and curettage of uterus      miscarriage  . Sigmoidectomy      Hand-assisted laparoscopic sigmoidectomy with splenic flexure takedown.  . Cholecystectomy N/A 03/25/2014    Procedure: LAPAROSCOPIC CHOLECYSTECTOMY;  Surgeon: Ralene Ok, MD;  Location: [facility] OR;  Service: General;  Laterality: N/A;    Family History  Problem Relation Age of Onset  . Barrett's esophagus Mother   . Coronary artery disease Mother   .  Congestive Heart Failure Mother   . Hypertension Mother   . Fibromyalgia Mother   . Chronic fatigue Mother   . Anemia Mother   . Irritable bowel syndrome Mother   . Atrial fibrillation Mother   . Colon polyps Father   . Diabetes Father   . Hypertension Father   . Heart disease Father   . Colon cancer Neg Hx   . Alcohol abuse Other   . Arthritis Other   . Hyperlipidemia Other   . Hypertension Other   . Stroke Other   . Coronary artery disease Other   . Clotting disorder Other     Government social research officer  . Crohn's disease Cousin   . Diabetes Paternal Aunt   . Diabetes Paternal Grandfather   . Ehlers-Danlos syndrome Daughter   . Anemia Daughter   . Other Daughter     Postural orthostatic hypotension  .  Irritable bowel syndrome Daughter     Allergies  Allergen Reactions  . Hydrocodone Nausea Only and Other (See Comments)    Hallucination   . Dilaudid [Hydromorphone Hcl]   . Latex Rash  . Penicillins Rash and Other (See Comments)    Childhood allergy    Current Outpatient Prescriptions on File Prior to Visit  Medication Sig Dispense Refill  . Cyanocobalamin (B-12) 1000 MCG SUBL Place 1 each under the tongue daily.    Marland Kitchen escitalopram (LEXAPRO) 10 MG tablet Take 10 mg by mouth daily.    . fluticasone (FLONASE) 50 MCG/ACT nasal spray Place 2 sprays into both nostrils daily. 16 g 6  . levocetirizine (XYZAL) 5 MG tablet TAKE 1 TABLET(5 MG) BY MOUTH EVERY EVENING 30 tablet 6  . levothyroxine (SYNTHROID) 100 MCG tablet Take 100 mcg by mouth daily before breakfast.    . Omega-3 Fatty Acids (FISH OIL) 1000 MG CAPS Take 2,000 mg by mouth 2 (two) times daily.    Marland Kitchen omeprazole (PRILOSEC) 20 MG capsule Take 1 capsule (20 mg total) by mouth daily. 30 capsule 3  . PAZEO 0.7 % SOLN 1 drop each eye in the morning as needed.  2   No current facility-administered medications on file prior to visit.    BP 120/80 mmHg  Pulse 78  Temp(Src) 98.3 F (36.8 C) (Oral)  Resp 16  Ht 5\' 9"  (1.753 m)  Wt 218 lb 6.4 oz (99.066 kg)  BMI 32.24 kg/m2  SpO2 100%       Objective:   Physical Exam  Physical Exam  Constitutional: She is oriented to person, place, and time. She appears well-developed and well-nourished. No distress.  HENT:  Head: Normocephalic and atraumatic.  Right Ear: Tympanic membrane and ear canal normal.  Left Ear: Tympanic membrane and ear canal normal.  Mouth/Throat: Oropharynx is clear and moist.  Eyes: Pupils are equal, round, and reactive to light. No scleral icterus.  Neck: Normal range of motion. No thyromegaly present.  Cardiovascular: Normal rate and regular rhythm.   No murmur heard. Pulmonary/Chest: Effort normal and breath sounds normal. No respiratory distress. He has  no wheezes. She has no rales. She exhibits no tenderness.  Abdominal: Soft. Bowel sounds are normal. He exhibits no distension and no mass. There is no tenderness. There is no rebound and no guarding.  Musculoskeletal: She exhibits no edema.  Lymphadenopathy:    She has no cervical adenopathy.  Neurological: She is alert and oriented to person, place, and time. She has normal patellar reflexes. She exhibits normal muscle tone. Coordination normal.  Skin: Skin  is warm and dry.  Psychiatric: She has a normal mood and affect. Her behavior is normal. Judgment and thought content normal.  Breasts: Examined lying Right: Without masses, retractions, discharge or axillary adenopathy.  Left: Without masses, retractions, discharge or axillary adenopathy.  Pelvic: deferred       Assessment & Plan:         Assessment & Plan:  EKG tracing is personally reviewed.  EKG notes NSR.  No acute changes.

## 2015-12-05 ENCOUNTER — Encounter: Payer: Self-pay | Admitting: Family

## 2015-12-08 DIAGNOSIS — H10422 Simple chronic conjunctivitis, left eye: Secondary | ICD-10-CM | POA: Diagnosis not present

## 2015-12-19 NOTE — Telephone Encounter (Signed)
Attempted to reach pt by phone and received message that mailbox is full. Mailed letter to pt.

## 2015-12-19 NOTE — Telephone Encounter (Signed)
Please contact pt re: unread mychart message. 

## 2015-12-27 DIAGNOSIS — M722 Plantar fascial fibromatosis: Secondary | ICD-10-CM | POA: Diagnosis not present

## 2016-01-07 DIAGNOSIS — R7301 Impaired fasting glucose: Secondary | ICD-10-CM | POA: Diagnosis not present

## 2016-01-07 DIAGNOSIS — E039 Hypothyroidism, unspecified: Secondary | ICD-10-CM | POA: Diagnosis not present

## 2016-01-07 DIAGNOSIS — E538 Deficiency of other specified B group vitamins: Secondary | ICD-10-CM | POA: Diagnosis not present

## 2016-01-27 DIAGNOSIS — M722 Plantar fascial fibromatosis: Secondary | ICD-10-CM | POA: Diagnosis not present

## 2016-01-29 ENCOUNTER — Other Ambulatory Visit: Payer: Self-pay | Admitting: Family

## 2016-01-30 NOTE — Telephone Encounter (Signed)
Refill sent per [facility] refill protocol/SLS  

## 2016-02-26 DIAGNOSIS — M722 Plantar fascial fibromatosis: Secondary | ICD-10-CM | POA: Diagnosis not present

## 2016-03-05 ENCOUNTER — Other Ambulatory Visit: Payer: Self-pay | Admitting: Family

## 2016-03-10 DIAGNOSIS — Z113 Encounter for screening for infections with a predominantly sexual mode of transmission: Secondary | ICD-10-CM | POA: Diagnosis not present

## 2016-03-10 DIAGNOSIS — Z1159 Encounter for screening for other viral diseases: Secondary | ICD-10-CM | POA: Diagnosis not present

## 2016-03-10 DIAGNOSIS — Z01419 Encounter for gynecological examination (general) (routine) without abnormal findings: Secondary | ICD-10-CM | POA: Diagnosis not present

## 2016-03-10 DIAGNOSIS — Z6832 Body mass index (BMI) 32.0-32.9, adult: Secondary | ICD-10-CM | POA: Diagnosis not present

## 2016-03-24 DIAGNOSIS — E063 Autoimmune thyroiditis: Secondary | ICD-10-CM | POA: Diagnosis not present

## 2016-03-24 DIAGNOSIS — E039 Hypothyroidism, unspecified: Secondary | ICD-10-CM | POA: Diagnosis not present

## 2016-03-24 DIAGNOSIS — E538 Deficiency of other specified B group vitamins: Secondary | ICD-10-CM | POA: Diagnosis not present

## 2016-03-24 DIAGNOSIS — R7301 Impaired fasting glucose: Secondary | ICD-10-CM | POA: Diagnosis not present

## 2016-04-02 DIAGNOSIS — Z1231 Encounter for screening mammogram for malignant neoplasm of breast: Secondary | ICD-10-CM | POA: Diagnosis not present

## 2016-04-23 ENCOUNTER — Ambulatory Visit (INDEPENDENT_AMBULATORY_CARE_PROVIDER_SITE_OTHER): Payer: BLUE CROSS/BLUE SHIELD | Admitting: Family

## 2016-04-23 ENCOUNTER — Encounter: Payer: Self-pay | Admitting: Family

## 2016-04-23 VITALS — BP 125/69 | HR 89 | Temp 98.5°F | Resp 16 | Ht 69.0 in | Wt 216.2 lb

## 2016-04-23 DIAGNOSIS — J0100 Acute maxillary sinusitis, unspecified: Secondary | ICD-10-CM | POA: Diagnosis not present

## 2016-04-23 MED ORDER — DOXYCYCLINE HYCLATE 100 MG PO TABS: 100.0000 mg | ORAL_TABLET | Freq: Two times a day (BID) | ORAL | 0 refills | Status: DC

## 2016-04-23 NOTE — Patient Instructions (Addendum)
Begin doxycycline for sinus infection. Continue xyzal/flonase. Please call if new/worsening symptoms, or if symptoms are not improved in 3 days.

## 2016-04-23 NOTE — Progress Notes (Signed)
Subjective:    Patient ID: Christine [name], female    DOB: [birth year], 45 y.o.   MRN: [id number]  HPI  Christine [name] is a 45 yr old female who presents today with complaint of facial/frontal pressure. "teeth hurt."  Has associated post nasal drip, throat irritation and bilateral ear pain (shooting pain). She reports off/on feverish pain. She has been using flonase and xyxal.   Review of Systems See HPI  Past Medical History:  Diagnosis Date  . Allergy   . Anemia    stated h/o pernicious anemia  . Arthritis    osteoarthritis  . Asthma    Acute asthmatic bronchitis 2011 while pregnant  . Carpal tunnel syndrome    both wrist  . Depression   . Diverticulitis    recurrent  . Family history of anesthesia complication    nausea and vomiting  . Fibromyalgia   . GERD (gastroesophageal reflux disease)   . Hypothyroidism   . Migraine, unspecified, without mention of intractable migraine without mention of status migrainosus   . Miscarriage 2007  . PONV (postoperative nausea and vomiting)   . Thyroid disease      Social History   Social History  . Marital status: Married    Spouse name: N/A  . Number of children: 2  . Years of education: N/A   Occupational History  . Not on file.   Social History Main Topics  . Smoking status: Former Smoker    Packs/day: 0.50    Years: 3.00    Types: Cigarettes    Quit date: 01/31/2000  . Smokeless tobacco: Never Used  . Alcohol use 1.2 oz/week    2 Glasses of wine per week     Comment: social  . Drug use: No  . Sexual activity: Not on file   Other Topics Concern  . Not on file   Social History Narrative   Married, 2 daughters   Occupation: Housewife   Daily Caffeine Use   Patient does not get regular exercise.    Past Surgical History:  Procedure Laterality Date  . CHOLECYSTECTOMY N/A 03/25/2014   Procedure: LAPAROSCOPIC CHOLECYSTECTOMY;  Surgeon: Ralene Ok, MD;  Location: [place];  Service: General;  Laterality: N/A;   . DILATION AND CURETTAGE OF UTERUS     miscarriage  . LAPAROSCOPY  5/94   x 2  . SIGMOIDECTOMY     Hand-assisted laparoscopic sigmoidectomy with splenic flexure takedown.    Family History  Problem Relation Age of Onset  . Barrett's esophagus Mother   . Coronary artery disease Mother   . Congestive Heart Failure Mother   . Hypertension Mother   . Fibromyalgia Mother   . Chronic fatigue Mother   . Anemia Mother   . Irritable bowel syndrome Mother   . Atrial fibrillation Mother   . Colon polyps Father   . Diabetes Father   . Hypertension Father   . Heart disease Father   . Colon cancer Neg Hx   . Alcohol abuse Other   . Arthritis Other   . Hyperlipidemia Other   . Hypertension Other   . Stroke Other   . Coronary artery disease Other   . Clotting disorder Other     Government social research officer  . Crohn's disease Cousin   . Diabetes Paternal Aunt   . Diabetes Paternal Grandfather   . Ehlers-Danlos syndrome Daughter   . Anemia Daughter   . Other Daughter     Postural orthostatic hypotension  . Irritable  bowel syndrome Daughter     Allergies  Allergen Reactions  . Hydrocodone Nausea Only and Other (See Comments)    Hallucination   . Dilaudid [Hydromorphone Hcl]   . Latex Rash  . Penicillins Rash and Other (See Comments)    Childhood allergy    Current Outpatient Prescriptions on File Prior to Visit  Medication Sig Dispense Refill  . Cholecalciferol (VITAMIN D) 2000 units CAPS Take by mouth.    . Cyanocobalamin (B-12) 1000 MCG SUBL Place 1 each under the tongue daily.    Marland Kitchen escitalopram (LEXAPRO) 10 MG tablet Take 10 mg by mouth daily.    . fluticasone (FLONASE) 50 MCG/ACT nasal spray Place 2 sprays into both nostrils daily. 16 g 6  . Ginger, Zingiber officinalis, (GINGER PO) Take 1 capsule by mouth daily.    Marland Kitchen levocetirizine (XYZAL) 5 MG tablet TAKE 1 TABLET(5 MG) BY MOUTH EVERY EVENING 30 tablet 5  . levothyroxine (SYNTHROID) 100 MCG tablet Take 100 mcg by mouth daily  before breakfast.    . Omega-3 Fatty Acids (FISH OIL) 1000 MG CAPS Take 2,000 mg by mouth 2 (two) times daily.    Marland Kitchen omeprazole (PRILOSEC) 20 MG capsule Take 1 capsule (20 mg total) by mouth daily. 30 capsule 3  . OVER THE COUNTER MEDICATION Take 2 capsules by mouth 2 (two) times daily. TUMERIC    . PAZEO 0.7 % SOLN 1 drop each eye in the morning as needed.  2   No current facility-administered medications on file prior to visit.     BP 125/69 (BP Location: Right Arm, Cuff Size: Normal)   Pulse 89   Temp 98.5 F (36.9 C) (Oral)   Resp 16   Ht 5\' 9"  (1.753 m)   Wt 216 lb 3.2 oz (98.1 kg)   SpO2 98% Comment: room air  BMI 31.93 kg/m       Objective:   Physical Exam  Constitutional: She appears well-developed and well-nourished.  HENT:  Head: Normocephalic and atraumatic.  Right Ear: Tympanic membrane and ear canal normal.  Left Ear: Tympanic membrane and ear canal normal.  Nose: Right sinus exhibits maxillary sinus tenderness. Left sinus exhibits maxillary sinus tenderness.  Mouth/Throat: No posterior oropharyngeal edema or posterior oropharyngeal erythema.  Cardiovascular: Normal rate, regular rhythm and normal heart sounds.   No murmur heard. Pulmonary/Chest: Effort normal and breath sounds normal. No respiratory distress. She has no wheezes.  Psychiatric: She has a normal mood and affect. Her behavior is normal. Judgment and thought content normal.          Assessment & Plan:  Maxillary sinusitis- has pen allergy- will rx with doxycycline. Continue xyzal and flonase. Advised pt to follow up if new/worsening symptoms or if symptoms are not improved in 3 days.

## 2016-04-23 NOTE — Progress Notes (Signed)
Pre visit review using our clinic review tool, if applicable. No additional management support is needed unless otherwise documented below in the visit note. 

## 2016-05-24 DIAGNOSIS — M19041 Primary osteoarthritis, right hand: Secondary | ICD-10-CM | POA: Insufficient documentation

## 2016-05-24 DIAGNOSIS — R5382 Chronic fatigue, unspecified: Secondary | ICD-10-CM | POA: Insufficient documentation

## 2016-05-24 DIAGNOSIS — G4700 Insomnia, unspecified: Secondary | ICD-10-CM | POA: Insufficient documentation

## 2016-05-24 DIAGNOSIS — M47812 Spondylosis without myelopathy or radiculopathy, cervical region: Secondary | ICD-10-CM | POA: Insufficient documentation

## 2016-05-24 DIAGNOSIS — M797 Fibromyalgia: Secondary | ICD-10-CM | POA: Insufficient documentation

## 2016-05-24 DIAGNOSIS — M19042 Primary osteoarthritis, left hand: Secondary | ICD-10-CM

## 2016-05-25 ENCOUNTER — Encounter: Payer: Self-pay | Admitting: Rheumatology

## 2016-05-25 ENCOUNTER — Ambulatory Visit (INDEPENDENT_AMBULATORY_CARE_PROVIDER_SITE_OTHER): Payer: BLUE CROSS/BLUE SHIELD | Admitting: Rheumatology

## 2016-05-25 VITALS — BP 146/89 | HR 71 | Resp 13 | Ht 69.0 in | Wt 224.0 lb

## 2016-05-25 DIAGNOSIS — G4700 Insomnia, unspecified: Secondary | ICD-10-CM

## 2016-05-25 DIAGNOSIS — M797 Fibromyalgia: Secondary | ICD-10-CM | POA: Diagnosis not present

## 2016-05-25 DIAGNOSIS — G5603 Carpal tunnel syndrome, bilateral upper limbs: Secondary | ICD-10-CM | POA: Diagnosis not present

## 2016-05-25 DIAGNOSIS — M542 Cervicalgia: Secondary | ICD-10-CM | POA: Diagnosis not present

## 2016-05-25 DIAGNOSIS — M19042 Primary osteoarthritis, left hand: Secondary | ICD-10-CM | POA: Diagnosis not present

## 2016-05-25 DIAGNOSIS — M62838 Other muscle spasm: Secondary | ICD-10-CM | POA: Diagnosis not present

## 2016-05-25 DIAGNOSIS — M19041 Primary osteoarthritis, right hand: Secondary | ICD-10-CM | POA: Diagnosis not present

## 2016-05-25 DIAGNOSIS — R5382 Chronic fatigue, unspecified: Secondary | ICD-10-CM

## 2016-05-25 DIAGNOSIS — M5030 Other cervical disc degeneration, unspecified cervical region: Secondary | ICD-10-CM

## 2016-05-25 DIAGNOSIS — M47812 Spondylosis without myelopathy or radiculopathy, cervical region: Secondary | ICD-10-CM

## 2016-05-25 MED ORDER — METHOCARBAMOL 500 MG PO TABS: 500.0000 mg | ORAL_TABLET | Freq: Two times a day (BID) | ORAL | 2 refills | Status: DC

## 2016-05-25 MED ORDER — DICLOFENAC SODIUM 1 % TD GEL: 2.0000 g | Freq: Three times a day (TID) | TRANSDERMAL | 3 refills | Status: DC | PRN

## 2016-05-25 MED ORDER — MELOXICAM 7.5 MG PO TABS
7.5000 mg | ORAL_TABLET | Freq: Two times a day (BID) | ORAL | 2 refills | Status: DC
Start: 2016-05-25 — End: 2016-07-30

## 2016-05-25 MED ORDER — LIDOCAINE HCL 1 % IJ SOLN
0.3000 mL | INTRAMUSCULAR | Status: AC | PRN
  Administered 2016-05-25: .3 mL

## 2016-05-25 MED ORDER — TRIAMCINOLONE ACETONIDE 40 MG/ML IJ SUSP
10.0000 mg | INTRAMUSCULAR | Status: AC | PRN
  Administered 2016-05-25: 10 mg via INTRAMUSCULAR

## 2016-05-25 MED ORDER — CYCLOBENZAPRINE HCL 10 MG PO TABS: 10.0000 mg | ORAL_TABLET | Freq: Every day | ORAL | 2 refills | Status: DC

## 2016-05-25 MED ORDER — TENNIS ELBOW NEOPRENE BRACE MISC: 1.0000 | 0 refills | Status: DC | PRN

## 2016-05-25 NOTE — Progress Notes (Signed)
*IMAGE* Office Visit Note  Patient: Christine [name]             Date of Birth: [birth year]           MRN: [id number]             PCP: Nance Pear., NP Referring: Debbrah Alar, NP Visit Date: 05/25/2016    Subjective:  Follow-up FMS  History of Present Illness: Christine [name] is a 45 y.o. female follow-up on fibromyalgia syndrome, fatigue, insomnia. Also having OA of bilateral hand pain.  She is especially having pain in the trapezius muscle. The pain from her neck goes up into her occiput area.   She has decreased range of motion of her neck as a result of the pain.  She's also complaining of left elbow joint pain on the lateral aspect as well as the medial aspect.  She continues to take her muscle relaxers as prescribed that R4 morning time use and evening time use.   Activities of Daily Living:  Patient reports morning stiffness for 15 minute.   Patient Reports nocturnal pain.  Difficulty dressing/grooming: Reports Difficulty climbing stairs: Denies Difficulty getting out of chair: Denies Difficulty using hands for taps, buttons, cutlery, and/or writing: Denies   Review of Systems  Constitutional: Positive for fatigue.  HENT: Negative for mouth sores and mouth dryness.   Eyes: Negative for dryness.  Respiratory: Negative for shortness of breath.   Gastrointestinal: Negative for constipation and diarrhea.  Musculoskeletal: Positive for myalgias and myalgias.  Skin: Negative for sensitivity to sunlight.  Psychiatric/Behavioral: Positive for sleep disturbance. Negative for decreased concentration.    PMFS History:  Patient Active Problem List   Diagnosis Date Noted  . Fibromyalgia 05/24/2016  . Chronic fatigue 05/24/2016  . Insomnia 05/24/2016  . Osteoarthritis of both hands 05/24/2016  . DJD (degenerative joint disease), cervical 05/24/2016  . Hyperlipidemia 05/21/2015  . Depression 05/21/2015  . Heel pain 11/19/2014  . Carpal tunnel  syndrome 11/19/2014  . Preventative health care 11/19/2014  . Hypothyroidism 06/13/2011  . Allergic rhinitis 12/19/2007  . GASTROESOPHAGEAL REFLUX DISEASE 09/27/2007    Past Medical History:  Diagnosis Date  . Allergy   . Anemia    stated h/o pernicious anemia  . Arthritis    osteoarthritis  . Asthma    Acute asthmatic bronchitis 2011 while pregnant  . Carpal tunnel syndrome    both wrist  . Depression   . Diverticulitis    recurrent  . Family history of anesthesia complication    nausea and vomiting  . Fibromyalgia   . GERD (gastroesophageal reflux disease)   . Hypothyroidism   . Migraine, unspecified, without mention of intractable migraine without mention of status migrainosus   . Miscarriage 2007  . PONV (postoperative nausea and vomiting)   . Thyroid disease     Family History  Problem Relation Age of Onset  . Barrett's esophagus Mother   . Coronary artery disease Mother   . Congestive Heart Failure Mother   . Hypertension Mother   . Fibromyalgia Mother   . Chronic fatigue Mother   . Anemia Mother   . Irritable bowel syndrome Mother   . Atrial fibrillation Mother   . Colon polyps Father   . Diabetes Father   . Hypertension Father   . Heart disease Father   . Ehlers-Danlos syndrome Daughter   . Anemia Daughter   . Other Daughter     Postural orthostatic hypotension  . Irritable bowel syndrome Daughter   .  Alcohol abuse Other   . Arthritis Other   . Hyperlipidemia Other   . Hypertension Other   . Stroke Other   . Coronary artery disease Other   . Clotting disorder Other     Government social research officer  . Crohn's disease Cousin   . Diabetes Paternal Aunt   . Diabetes Paternal Grandfather   . Colon cancer Neg Hx    Past Surgical History:  Procedure Laterality Date  . CHOLECYSTECTOMY N/A 03/25/2014   Procedure: LAPAROSCOPIC CHOLECYSTECTOMY;  Surgeon: Ralene Ok, MD;  Location: [facility];  Service: General;  Laterality: N/A;  . DILATION AND CURETTAGE OF UTERUS      miscarriage  . LAPAROSCOPY  5/94   x 2  . SIGMOIDECTOMY     Hand-assisted laparoscopic sigmoidectomy with splenic flexure takedown.   Social History   Social History Narrative   Married, 2 daughters   Occupation: Housewife   Daily Caffeine Use   Patient does not get regular exercise.     Objective: Vital Signs: BP (!) 146/89 (BP Location: Left Arm, Patient Position: Sitting, Cuff Size: Large)   Pulse 71   Resp 13   Ht 5\' 9"  (1.753 m)   Wt 224 lb (101.6 kg)   BMI 33.08 kg/m    Physical Exam  Constitutional: She is oriented to person, place, and time. She appears well-developed and well-nourished.  HENT:  Head: Normocephalic and atraumatic.  Eyes: EOM are normal. Pupils are equal, round, and reactive to light.  Cardiovascular: Normal rate, regular rhythm and normal heart sounds.  Exam reveals no gallop and no friction rub.   No murmur heard. Pulmonary/Chest: Effort normal and breath sounds normal. She has no wheezes. She has no rales.  Abdominal: Soft. Bowel sounds are normal. She exhibits no distension. There is no tenderness. There is no guarding. No hernia.  Musculoskeletal: Normal range of motion. She exhibits no edema, tenderness or deformity.  Lymphadenopathy:    She has no cervical adenopathy.  Neurological: She is alert and oriented to person, place, and time. Coordination normal.  Skin: Skin is warm and dry. Capillary refill takes less than 2 seconds. No rash noted.  Psychiatric: She has a normal mood and affect. Her behavior is normal.     Musculoskeletal Exam: Full range of motion of all joints with left elbow joint with pain on range of motion  Fibromyalgia tender points: 18 out of 18 positive.  CDAI Exam: CDAI Homunculus Exam:   Tenderness:  LUE: ulnohumeral and radiohumeral  Joint Counts:  CDAI Tender Joint count: 1 CDAI Swollen Joint count: 0  Global Assessments:  Patient Global Assessment: 7 Provider Global Assessment: 7  CDAI Calculated  Score: 15    Investigation: No additional findings.   Imaging: No results found.  Speciality Comments: No specialty comments available.    Procedures:  Trigger Point Inj Date/Time: 05/25/2016 9:23 PM Performed by: Eliezer Lofts Authorized by: Eliezer Lofts   Consent Given by:  Patient Site marked: the procedure site was marked   Timeout: prior to procedure the correct patient, procedure, and site was verified   Indications:  Muscle spasm and pain Total # of Trigger Points:  2 Location: neck   Needle Size:  27 G Approach:  Dorsal Medications #1:  10 mg triamcinolone acetonide 40 MG/ML; 0.3 mL lidocaine 1 % Medications #2:  10 mg triamcinolone acetonide 40 MG/ML; 0.3 mL lidocaine 1 % Patient tolerance:  Patient tolerated the procedure well with no immediate complications Comments: Pt felt better after  injecitons  Using the standard protocol bilateral trapezius muscles were injected with the above medication without any complication. Patient did feel better after the injections.   Allergies: Hydrocodone; Dilaudid [hydromorphone hcl]; Latex; and Penicillins   Assessment / Plan: Visit Diagnoses: Fibromyalgia  Chronic fatigue  Insomnia, unspecified type  Osteoarthritis of both hands, unspecified osteoarthritis type  Bilateral carpal tunnel syndrome  DJD (degenerative joint disease), cervical   No problem-specific Assessment & Plan notes found for this encounter.  Continue meds as prescribed  I wrote her prescription for brace for the left elbow for epicondylitis to the both medial and lateral aspect.  Meloxicam 7.5 mg twice a day when necessary  Robaxin 500 mg 1 at 7 AM and 1 at 2 PM when necessary  Flexeril 10 mg 1 by mouth daily at bedtime when necessary   Follow-Up Instructions: Return in about 5 months (around 10/23/2016) for FMS.  Orders: No orders of the defined types were placed in this encounter.  Meds ordered this encounter  Medications  .  methocarbamol (ROBAXIN) 500 MG tablet    Sig: Take 1 tablet (500 mg total) by mouth 2 (two) times daily.    Dispense:  60 tablet    Refill:  2  . cyclobenzaprine (FLEXERIL) 10 MG tablet    Sig: Take 1 tablet (10 mg total) by mouth at bedtime.    Dispense:  30 tablet    Refill:  2  . meloxicam (MOBIC) 7.5 MG tablet    Sig: Take 1 tablet (7.5 mg total) by mouth 2 (two) times daily.    Dispense:  60 tablet    Refill:  2  . Elastic Bandages & Supports (TENNIS ELBOW NEOPRENE BRACE) MISC    Sig: 1 Package by Other route as needed. Use as needed to left elbow to medial epicondyle or lateral epicondyle    Dispense:  1 each    Refill:  0  . diclofenac sodium (VOLTAREN) 1 % GEL    Sig: Apply 2 g topically 3 (three) times daily as needed. Apply upto 3 grams tid prn; use sparingly on dry skin.    Dispense:  100 g    Refill:  3

## 2016-05-31 ENCOUNTER — Telehealth: Payer: Self-pay | Admitting: Radiology

## 2016-05-31 NOTE — Telephone Encounter (Signed)
Dr Estanislado Pandy has never prescribed Meloxicam for her.

## 2016-05-31 NOTE — Telephone Encounter (Signed)
Refill request received via fax for Meloxicam   

## 2016-06-04 ENCOUNTER — Encounter: Payer: Self-pay | Admitting: Family

## 2016-06-04 ENCOUNTER — Ambulatory Visit (INDEPENDENT_AMBULATORY_CARE_PROVIDER_SITE_OTHER): Payer: BLUE CROSS/BLUE SHIELD | Admitting: Family

## 2016-06-04 VITALS — BP 134/79 | HR 84 | Temp 98.6°F | Resp 18 | Ht 69.0 in | Wt 218.4 lb

## 2016-06-04 DIAGNOSIS — F329 Major depressive disorder, single episode, unspecified: Secondary | ICD-10-CM

## 2016-06-04 DIAGNOSIS — J029 Acute pharyngitis, unspecified: Secondary | ICD-10-CM | POA: Diagnosis not present

## 2016-06-04 DIAGNOSIS — J069 Acute upper respiratory infection, unspecified: Secondary | ICD-10-CM

## 2016-06-04 DIAGNOSIS — B9789 Other viral agents as the cause of diseases classified elsewhere: Secondary | ICD-10-CM

## 2016-06-04 DIAGNOSIS — E785 Hyperlipidemia, unspecified: Secondary | ICD-10-CM

## 2016-06-04 DIAGNOSIS — F32A Depression, unspecified: Secondary | ICD-10-CM

## 2016-06-04 DIAGNOSIS — E039 Hypothyroidism, unspecified: Secondary | ICD-10-CM | POA: Diagnosis not present

## 2016-06-04 LAB — LIPID PANEL
CHOLESTEROL: 189 mg/dL (ref 0–200)
HDL: 44.3 mg/dL (ref >39.00)
LDL CALC: 113 mg/dL — AB (ref 0–99)
NonHDL: 144.68
TRIGLYCERIDES: 159 mg/dL — AB (ref 0.0–149.0)
Total CHOL/HDL Ratio: 4
VLDL: 31.8 mg/dL (ref 0.0–40.0)

## 2016-06-04 LAB — POCT RAPID STREP A (OFFICE): RAPID STREP A SCREEN: NEGATIVE

## 2016-06-04 LAB — TSH: TSH: 3.11 u[IU]/mL (ref 0.35–4.50)

## 2016-06-04 MED ORDER — ESCITALOPRAM OXALATE 10 MG PO TABS: 10.0000 mg | ORAL_TABLET | Freq: Every day | ORAL | 1 refills | Status: DC

## 2016-06-04 NOTE — Progress Notes (Signed)
Subjective:    Patient ID: Christine [name], female    DOB: May 05, 1971, 45 y.o.   MRN: [id number]  HPI  Christine [name] is a 45 yr old female who presents today for follow up.  1) Depression- she is currently maintained on lexapro 10mg . Reports mood is "ok."    2) Hyperlipidemia-  She is maintained on fish oil.  Lab Results  Component Value Date   CHOL 198 12/03/2015   HDL 32.60 (L) 12/03/2015   LDLCALC 111 08/22/2015   LDLDIRECT 135.0 12/03/2015   TRIG 324.0 (H) 12/03/2015   CHOLHDL 6 12/03/2015   3) Hypothyroid- maintained on synthroid-  Lab Results  Component Value Date   TSH 1.46 12/03/2015   4) Nasal congestion-  Began 4 days ago.  Has associated hoarseness, sore throat.    FM- she is following with rheumatology.   Review of Systems See HPI  Past Medical History:  Diagnosis Date  . Allergy   . Anemia    stated h/o pernicious anemia  . Arthritis    osteoarthritis  . Asthma    Acute asthmatic bronchitis 2011 while pregnant  . Carpal tunnel syndrome    both wrist  . Depression   . Diverticulitis    recurrent  . Family history of anesthesia complication    nausea and vomiting  . Fibromyalgia   . GERD (gastroesophageal reflux disease)   . Hypothyroidism   . Migraine, unspecified, without mention of intractable migraine without mention of status migrainosus   . Miscarriage 2007  . PONV (postoperative nausea and vomiting)   . Thyroid disease      Social History   Social History  . Marital status: Married    Spouse name: N/A  . Number of children: 2  . Years of education: N/A   Occupational History  . Not on file.   Social History Main Topics  . Smoking status: Former Smoker    Packs/day: 0.50    Years: 3.00    Types: Cigarettes    Quit date: 01/31/2000  . Smokeless tobacco: Never Used  . Alcohol use 1.2 oz/week    2 Glasses of wine per week     Comment: social  . Drug use: No  . Sexual activity: Yes    Birth control/ protection: IUD    Other Topics Concern  . Not on file   Social History Narrative   Married, 2 daughters   Occupation: Housewife   Daily Caffeine Use   Patient does not get regular exercise.    Past Surgical History:  Procedure Laterality Date  . CHOLECYSTECTOMY N/A 03/25/2014   Procedure: LAPAROSCOPIC CHOLECYSTECTOMY;  Surgeon: Ralene Ok, MD;  Location: [facility];  Service: General;  Laterality: N/A;  . DILATION AND CURETTAGE OF UTERUS     miscarriage  . LAPAROSCOPY  5/94   x 2  . SIGMOIDECTOMY     Hand-assisted laparoscopic sigmoidectomy with splenic flexure takedown.    Family History  Problem Relation Age of Onset  . Barrett's esophagus Mother   . Coronary artery disease Mother   . Congestive Heart Failure Mother   . Hypertension Mother   . Fibromyalgia Mother   . Chronic fatigue Mother   . Anemia Mother   . Irritable bowel syndrome Mother   . Atrial fibrillation Mother   . Colon polyps Father   . Diabetes Father   . Hypertension Father   . Heart disease Father   . Ehlers-Danlos syndrome Daughter   . Anemia Daughter   .  Other Daughter     Postural orthostatic hypotension  . Irritable bowel syndrome Daughter   . Alcohol abuse Other   . Arthritis Other   . Hyperlipidemia Other   . Hypertension Other   . Stroke Other   . Coronary artery disease Other   . Clotting disorder Other     Government social research officer  . Crohn's disease Cousin   . Diabetes Paternal Aunt   . Diabetes Paternal Grandfather   . Colon cancer Neg Hx     Allergies  Allergen Reactions  . Hydrocodone Nausea Only and Other (See Comments)    Hallucination   . Dilaudid [Hydromorphone Hcl]   . Latex Rash  . Penicillins Rash and Other (See Comments)    Childhood allergy    Current Outpatient Prescriptions on File Prior to Visit  Medication Sig Dispense Refill  . Cholecalciferol (VITAMIN D) 2000 units CAPS Take by mouth.    . Cyanocobalamin (B-12) 1000 MCG SUBL Place 1 each under the tongue daily.    .  cyclobenzaprine (FLEXERIL) 10 MG tablet Take 1 tablet (10 mg total) by mouth at bedtime. 30 tablet 2  . diclofenac sodium (VOLTAREN) 1 % GEL Apply 2 g topically 3 (three) times daily as needed. Apply upto 3 grams tid prn; use sparingly on dry skin. 100 g 3  . doxycycline (VIBRA-TABS) 100 MG tablet Take 1 tablet (100 mg total) by mouth 2 (two) times daily. 20 tablet 0  . Elastic Bandages & Supports (TENNIS ELBOW NEOPRENE BRACE) MISC 1 Package by Other route as needed. Use as needed to left elbow to medial epicondyle or lateral epicondyle 1 each 0  . escitalopram (LEXAPRO) 10 MG tablet Take 10 mg by mouth daily.    . fluticasone (FLONASE) 50 MCG/ACT nasal spray Place 2 sprays into both nostrils daily. 16 g 6  . Ginger, Zingiber officinalis, (GINGER PO) Take 1 capsule by mouth daily.    Marland Kitchen levocetirizine (XYZAL) 5 MG tablet TAKE 1 TABLET(5 MG) BY MOUTH EVERY EVENING 30 tablet 5  . levothyroxine (SYNTHROID) 100 MCG tablet Take 100 mcg by mouth daily before breakfast.    . meloxicam (MOBIC) 7.5 MG tablet Take 1 tablet (7.5 mg total) by mouth 2 (two) times daily. 60 tablet 2  . Omega-3 Fatty Acids (FISH OIL) 1000 MG CAPS Take 2,000 mg by mouth 2 (two) times daily.    Marland Kitchen omeprazole (PRILOSEC) 20 MG capsule Take 1 capsule (20 mg total) by mouth daily. 30 capsule 3  . OVER THE COUNTER MEDICATION Take 2 capsules by mouth 2 (two) times daily. TUMERIC    . PAZEO 0.7 % SOLN 1 drop each eye in the morning as needed.  2  . valACYclovir (VALTREX) 500 MG tablet Take 500 mg by mouth daily as needed.     No current facility-administered medications on file prior to visit.     BP 134/79 (BP Location: Right Arm, Patient Position: Sitting, Cuff Size: Large)   Pulse 84   Temp 98.6 F (37 C) (Oral)   Resp 18   Ht 5\' 9"  (1.753 m)   Wt 218 lb 6.4 oz (99.1 kg)   SpO2 99% Comment: RA  BMI 32.25 kg/m       Objective:   Physical Exam  Constitutional: She is oriented to person, place, and time. She appears  well-developed and well-nourished.  HENT:  Right Ear: Tympanic membrane and ear canal normal.  Left Ear: Tympanic membrane and ear canal normal.  Slightly swollen uvula, mild  oropharyngeal erythema without exudates  Eyes: Conjunctivae are normal.  Neck: No thyromegaly present.  Cardiovascular: Normal rate, regular rhythm and normal heart sounds.   No murmur heard. Pulmonary/Chest: Effort normal and breath sounds normal. No respiratory distress. She has no wheezes.  Musculoskeletal: She exhibits no edema.  Lymphadenopathy:    She has no cervical adenopathy.  Neurological: She is alert and oriented to person, place, and time.  Psychiatric: She has a normal mood and affect. Her behavior is normal. Judgment and thought content normal.          Assessment & Plan:  Viral URI- rapid strep is negative. Advised pt to call if symptoms worsen or if not improved in a few more days.

## 2016-06-04 NOTE — Patient Instructions (Signed)
Please call if cold symptoms worsen or do not improve in the next 3 days.

## 2016-06-04 NOTE — Assessment & Plan Note (Signed)
Stable on lexapo, continue same.

## 2016-06-04 NOTE — Assessment & Plan Note (Signed)
Clinically stable on synthroid, will obtain TSH.

## 2016-06-04 NOTE — Progress Notes (Signed)
Pre visit review using our clinic review tool, if applicable. No additional management support is needed unless otherwise documented below in the visit note. 

## 2016-06-04 NOTE — Assessment & Plan Note (Signed)
Obtain follow up lipid panel.

## 2016-06-09 ENCOUNTER — Encounter: Payer: Self-pay | Admitting: Family

## 2016-07-09 ENCOUNTER — Other Ambulatory Visit: Payer: Self-pay | Admitting: Family

## 2016-07-30 ENCOUNTER — Encounter: Payer: Self-pay | Admitting: Family Medicine

## 2016-07-30 ENCOUNTER — Ambulatory Visit (INDEPENDENT_AMBULATORY_CARE_PROVIDER_SITE_OTHER): Payer: BLUE CROSS/BLUE SHIELD | Admitting: Family Medicine

## 2016-07-30 VITALS — BP 123/83 | HR 84 | Temp 98.5°F | Ht 69.0 in | Wt 220.8 lb

## 2016-07-30 DIAGNOSIS — J111 Influenza due to unidentified influenza virus with other respiratory manifestations: Secondary | ICD-10-CM

## 2016-07-30 DIAGNOSIS — J0100 Acute maxillary sinusitis, unspecified: Secondary | ICD-10-CM | POA: Diagnosis not present

## 2016-07-30 DIAGNOSIS — J029 Acute pharyngitis, unspecified: Secondary | ICD-10-CM | POA: Diagnosis not present

## 2016-07-30 LAB — POCT RAPID STREP A (OFFICE): Rapid Strep A Screen: NEGATIVE

## 2016-07-30 MED ORDER — KETOROLAC TROMETHAMINE 60 MG/2ML IM SOLN
60.0000 mg | Freq: Once | INTRAMUSCULAR | Status: AC
  Administered 2016-07-30: 60 mg via INTRAMUSCULAR

## 2016-07-30 MED ORDER — DOXYCYCLINE HYCLATE 100 MG PO TABS: 100.0000 mg | ORAL_TABLET | Freq: Two times a day (BID) | ORAL | 0 refills | Status: DC

## 2016-07-30 NOTE — Progress Notes (Signed)
Pre visit review using our clinic review tool, if applicable. No additional management support is needed unless otherwise documented below in the visit note. 

## 2016-07-30 NOTE — Progress Notes (Signed)
Chief Complaint  Patient presents with  . Generalized Body Aches    c/o diarrhea, body aches, fever, fatigue, sore throat, facial pain, congestion. Sore throat started 1 week ago and other sx's started Christmas Eve.     Christine [name] here for URI complaints.  Duration: 5 days   Associated symptoms: subjective fever, ST, rhinorrhea, stuffy nose, productive cough, sinus pressure, muscle aches, and diarrhea (which is resolving) Denies: ear drainage, bleeding, abd pain, SOB, N/V Treatment to date: Phenylephrine, Tylenol and Robitussin Sick contacts: Yes- daughter had sore throat, son had stomach flu  ROS:  Const: +fevers HEENT: As noted in HPI Lungs: No SOB  Past Medical History:  Diagnosis Date  . Allergy   . Anemia    stated h/o pernicious anemia  . Arthritis    osteoarthritis  . Asthma    Acute asthmatic bronchitis 2011 while pregnant  . Carpal tunnel syndrome    both wrist  . Depression   . Diverticulitis    recurrent  . Family history of anesthesia complication    nausea and vomiting  . Fibromyalgia   . GERD (gastroesophageal reflux disease)   . Hypothyroidism   . Migraine, unspecified, without mention of intractable migraine without mention of status migrainosus   . Miscarriage 2007  . PONV (postoperative nausea and vomiting)   . Thyroid disease    Family History  Problem Relation Age of Onset  . Barrett's esophagus Mother   . Coronary artery disease Mother   . Congestive Heart Failure Mother   . Hypertension Mother   . Fibromyalgia Mother   . Chronic fatigue Mother   . Anemia Mother   . Irritable bowel syndrome Mother   . Atrial fibrillation Mother   . Colon polyps Father   . Diabetes Father   . Hypertension Father   . Heart disease Father   . Ehlers-Danlos syndrome Daughter   . Anemia Daughter   . Other Daughter     Postural orthostatic hypotension  . Irritable bowel syndrome Daughter   . Alcohol abuse Other   . Arthritis Other   .  Hyperlipidemia Other   . Hypertension Other   . Stroke Other   . Coronary artery disease Other   . Clotting disorder Other     Government social research officer  . Crohn's disease Cousin   . Diabetes Paternal Aunt   . Diabetes Paternal Grandfather   . Colon cancer Neg Hx     BP 123/83   Pulse 84   Temp 98.5 F (36.9 C) (Oral)   Ht 5\' 9"  (1.753 m)   Wt 220 lb 12.8 oz (100.2 kg)   SpO2 96%   BMI 32.61 kg/m  General: Awake, alert, appears stated age HEENT: AT, [place], ears patent b/l and TM's neg, nares patent w/o discharge, +TTP over sinuses, pharynx pink and without exudates, MMM Neck: No masses or asymmetry, no adenopathy Heart: RRR, no murmurs, no bruits Lungs: CTAB, no accessory muscle use Skin: diaphoretic, clammy Abd: Mildly TTP diffusely, BS+, no masses or organomegaly Psych: Age appropriate judgment and insight, normal mood and affect  Influenza - Plan: ketorolac (TORADOL) injection 60 mg  Sore throat - Plan: POCT rapid strep A  Acute non-recurrent maxillary sinusitis - Plan: doxycycline (VIBRA-TABS) 100 MG tablet  Orders as above. Can't do anything for flu at this point (>4 days out). Rapid strep neg.  If symptoms worsen by next Tuesday, OK to use abx.  Cont pushing fluids, washing hands, covering mouth, and using Flonase/Xyzal.  Motrin and Tylenol for muscle aches. F/u in 1 week if symptoms worsen or fail to improve. Pt voiced understanding and agreement to the plan.  [place], DO 07/30/16 12:18 PM

## 2016-07-30 NOTE — Patient Instructions (Addendum)
Continue to use Xyzal and Flonase.  Drink lots of fluids.  Use Motrin and Tylenol to help control fevers, chills, muscle aches and general misery. Do not use Motrin for the rest of today.  Practice good hand hygiene.   If things are worsening by next Tuesday, then fill the antibiotic.

## 2016-09-07 ENCOUNTER — Other Ambulatory Visit: Payer: Self-pay | Admitting: Family

## 2016-10-21 DIAGNOSIS — M62838 Other muscle spasm: Secondary | ICD-10-CM | POA: Insufficient documentation

## 2016-10-21 NOTE — Progress Notes (Deleted)
Office Visit Note  Patient: Christine [name]             Date of Birth: [birth year]           MRN: [id number]             PCP: Christine Pear., NP Referring: Christine Alar, NP Visit Date: 10/25/2016 Occupation: @GUAROCC @    Subjective:  No chief complaint on file.   History of Present Illness: Christine [name] is a 46 y.o. female ***   Activities of Daily Living:  Patient reports morning stiffness for *** {minute/hour:19697}.   Patient {ACTIONS;DENIES/REPORTS:21021675::"Denies"} nocturnal pain.  Difficulty dressing/grooming: {ACTIONS;DENIES/REPORTS:21021675::"Denies"} Difficulty climbing stairs: {ACTIONS;DENIES/REPORTS:21021675::"Denies"} Difficulty getting out of chair: {ACTIONS;DENIES/REPORTS:21021675::"Denies"} Difficulty using hands for taps, buttons, cutlery, and/or writing: {ACTIONS;DENIES/REPORTS:21021675::"Denies"}   No Rheumatology ROS completed.   PMFS History:  Patient Active Problem List   Diagnosis Date Noted  . Fibromyalgia 05/24/2016  . Chronic fatigue 05/24/2016  . Insomnia 05/24/2016  . Osteoarthritis of both hands 05/24/2016  . DJD (degenerative joint disease), cervical 05/24/2016  . Hyperlipidemia 05/21/2015  . Depression 05/21/2015  . Heel pain 11/19/2014  . Carpal tunnel syndrome 11/19/2014  . Preventative health care 11/19/2014  . Hypothyroidism 06/13/2011  . Allergic rhinitis 12/19/2007  . GASTROESOPHAGEAL REFLUX DISEASE 09/27/2007    Past Medical History:  Diagnosis Date  . Allergy   . Anemia    stated h/o pernicious anemia  . Arthritis    osteoarthritis  . Asthma    Acute asthmatic bronchitis 2011 while pregnant  . Carpal tunnel syndrome    both wrist  . Depression   . Diverticulitis    recurrent  . Family history of anesthesia complication    nausea and vomiting  . Fibromyalgia   . GERD (gastroesophageal reflux disease)   . Hypothyroidism   . Migraine, unspecified, without mention of intractable migraine  without mention of status migrainosus   . Miscarriage 2007  . PONV (postoperative nausea and vomiting)   . Thyroid disease     Family History  Problem Relation Age of Onset  . Barrett's esophagus Mother   . Coronary artery disease Mother   . Congestive Heart Failure Mother   . Hypertension Mother   . Fibromyalgia Mother   . Chronic fatigue Mother   . Anemia Mother   . Irritable bowel syndrome Mother   . Atrial fibrillation Mother   . Colon polyps Father   . Diabetes Father   . Hypertension Father   . Heart disease Father   . Ehlers-Danlos syndrome Daughter   . Anemia Daughter   . Other Daughter     Postural orthostatic hypotension  . Irritable bowel syndrome Daughter   . Alcohol abuse Other   . Arthritis Other   . Hyperlipidemia Other   . Hypertension Other   . Stroke Other   . Coronary artery disease Other   . Clotting disorder Other     Government social research officer  . Crohn's disease Cousin   . Diabetes Paternal Aunt   . Diabetes Paternal Grandfather   . Colon cancer Neg Hx    Past Surgical History:  Procedure Laterality Date  . CHOLECYSTECTOMY N/A 03/25/2014   Procedure: LAPAROSCOPIC CHOLECYSTECTOMY;  Surgeon: Ralene Ok, MD;  Location: [facility];  Service: General;  Laterality: N/A;  . DILATION AND CURETTAGE OF UTERUS     miscarriage  . LAPAROSCOPY  5/94   x 2  . SIGMOIDECTOMY     Hand-assisted laparoscopic sigmoidectomy with splenic flexure takedown.   Social  History   Social History Narrative   Married, 2 daughters   Occupation: Housewife   Daily Caffeine Use   Patient does not get regular exercise.     Objective: Vital Signs: There were no vitals taken for this visit.   Physical Exam   Musculoskeletal Exam: ***  CDAI Exam: No CDAI exam completed.    Investigation: No additional findings. Office Visit on 07/30/2016  Component Date Value Ref Range Status  . Rapid Strep A Screen 07/30/2016 Negative  Negative Final  Office Visit on 06/04/2016    Component Date Value Ref Range Status  . TSH 06/04/2016 3.11  0.35 - 4.50 uIU/mL Final  . Cholesterol 06/04/2016 189  0 - 200 mg/dL Final  . Triglycerides 06/04/2016 159.0* 0.0 - 149.0 mg/dL Final  . HDL 06/04/2016 44.30  >39.00 mg/dL Final  . VLDL 06/04/2016 31.8  0.0 - 40.0 mg/dL Final  . LDL Cholesterol 06/04/2016 113* 0 - 99 mg/dL Final  . Total CHOL/HDL Ratio 06/04/2016 4   Final  . NonHDL 06/04/2016 144.68   Final  . Rapid Strep A Screen 06/04/2016 Negative  Negative Final     Imaging: No results found.  Speciality Comments: No specialty comments available.    Procedures:  No procedures performed Allergies: Hydrocodone; Dilaudid [hydromorphone hcl]; Latex; and Penicillins   Assessment / Plan:     Visit Diagnoses: No diagnosis found.    Orders: No orders of the defined types were placed in this encounter.  No orders of the defined types were placed in this encounter.   Face-to-face time spent with patient was *** minutes. 50% of time was spent in counseling and coordination of care.  Follow-Up Instructions: No Follow-up on file.   Christine [name], [place]  Note - This record has been created using [employer].  Chart creation errors have been sought, but may not always  have been located. Such creation errors do not reflect on  the standard of medical care.

## 2016-10-25 ENCOUNTER — Ambulatory Visit: Payer: BLUE CROSS/BLUE SHIELD | Admitting: Rheumatology

## 2016-10-29 DIAGNOSIS — J020 Streptococcal pharyngitis: Secondary | ICD-10-CM | POA: Diagnosis not present

## 2016-11-15 ENCOUNTER — Ambulatory Visit (INDEPENDENT_AMBULATORY_CARE_PROVIDER_SITE_OTHER): Payer: BLUE CROSS/BLUE SHIELD | Admitting: Family Medicine

## 2016-11-15 ENCOUNTER — Encounter: Payer: Self-pay | Admitting: Family Medicine

## 2016-11-15 VITALS — BP 124/78 | HR 88 | Temp 98.8°F | Resp 14 | Ht 69.0 in | Wt 224.1 lb

## 2016-11-15 DIAGNOSIS — J029 Acute pharyngitis, unspecified: Secondary | ICD-10-CM | POA: Diagnosis not present

## 2016-11-15 DIAGNOSIS — J301 Allergic rhinitis due to pollen: Secondary | ICD-10-CM

## 2016-11-15 LAB — POCT RAPID STREP A (OFFICE): Rapid Strep A Screen: NEGATIVE

## 2016-11-15 MED ORDER — PREDNISONE 20 MG PO TABS: ORAL_TABLET | ORAL | 0 refills | Status: DC

## 2016-11-15 MED ORDER — ALBUTEROL SULFATE HFA 108 (90 BASE) MCG/ACT IN AERS: 2.0000 | INHALATION_SPRAY | Freq: Four times a day (QID) | RESPIRATORY_TRACT | 1 refills | Status: DC | PRN

## 2016-11-15 NOTE — Patient Instructions (Signed)
Your rapid strep test is negative- I will also do a culture and be in touch with the results Use the inhaler as needed for wheezing and shortness of breath, and the prednisone as directed for sore throat and other symptoms Please let me know if you are not feeling better soon!

## 2016-11-15 NOTE — Progress Notes (Signed)
[facility] [address], [place], [place] [address] [phone]  Date:  11/15/2016   Name:  Christine [name]   DOB:  09-06-1970   MRN:  [id number]  PCP:  Nance Pear., NP    Chief Complaint: Sore Throat (end of March was treated for strep at [facility], completed w/ Zpak-hasn't felt much better) and Cough   History of Present Illness:  Christine [name] is a 45 y.o. very pleasant female patient who presents with the following:  Both pt and her son were dx with strep a little over 2 weeks ago- she is penicillin allergic so she was treated with a zpack Her throat got better for several days, but then her ST returned, and a cough, nasal drainage and her typical allergy sx as well.  She is not sure if her strep is back or if just allergies Her son did get wel She is not sure but has felt feverish- she did have a low grade fever at the start of her illness.   She will cough up some mucus She also has bad seasonal allergies which are also affecting her right now She does not have an inhaler but will use one when her allergies are worse in the springtime.  Would like a refill of this to have on hand   Patient Active Problem List   Diagnosis Date Noted  . Trapezius muscle spasm 10/21/2016  . Fibromyalgia 05/24/2016  . Chronic fatigue 05/24/2016  . Insomnia 05/24/2016  . Osteoarthritis of both hands 05/24/2016  . DJD (degenerative joint disease), cervical 05/24/2016  . Hyperlipidemia 05/21/2015  . Depression 05/21/2015  . Heel pain 11/19/2014  . Carpal tunnel syndrome 11/19/2014  . Preventative health care 11/19/2014  . Hypothyroidism 06/13/2011  . Allergic rhinitis 12/19/2007  . GASTROESOPHAGEAL REFLUX DISEASE 09/27/2007    Past Medical History:  Diagnosis Date  . Allergy   . Anemia    stated h/o pernicious anemia  . Arthritis    osteoarthritis  . Asthma    Acute asthmatic bronchitis 2011 while pregnant   . Carpal tunnel syndrome    both wrist  . Depression   . Diverticulitis    recurrent  . Family history of anesthesia complication    nausea and vomiting  . Fibromyalgia   . GERD (gastroesophageal reflux disease)   . Hypothyroidism   . Migraine, unspecified, without mention of intractable migraine without mention of status migrainosus   . Miscarriage 2007  . PONV (postoperative nausea and vomiting)   . Thyroid disease     Past Surgical History:  Procedure Laterality Date  . CHOLECYSTECTOMY N/A 03/25/2014   Procedure: LAPAROSCOPIC CHOLECYSTECTOMY;  Surgeon: Ralene Ok, MD;  Location: [facility];  Service: General;  Laterality: N/A;  . DILATION AND CURETTAGE OF UTERUS     miscarriage  . LAPAROSCOPY  5/94   x 2  . SIGMOIDECTOMY     Hand-assisted laparoscopic sigmoidectomy with splenic flexure takedown.    Social History  Substance Use Topics  . Smoking status: Former Smoker    Packs/day: 0.50    Years: 3.00    Types: Cigarettes    Quit date: 01/31/2000  . Smokeless tobacco: Never Used  . Alcohol use 1.2 oz/week    2 Glasses of wine per week     Comment: social    Family History  Problem Relation Age of Onset  . Barrett's esophagus Mother   . Coronary artery  disease Mother   . Congestive Heart Failure Mother   . Hypertension Mother   . Fibromyalgia Mother   . Chronic fatigue Mother   . Anemia Mother   . Irritable bowel syndrome Mother   . Atrial fibrillation Mother   . Colon polyps Father   . Diabetes Father   . Hypertension Father   . Heart disease Father   . Ehlers-Danlos syndrome Daughter   . Anemia Daughter   . Other Daughter     Postural orthostatic hypotension  . Irritable bowel syndrome Daughter   . Alcohol abuse Other   . Arthritis Other   . Hyperlipidemia Other   . Hypertension Other   . Stroke Other   . Coronary artery disease Other   . Clotting disorder Other     Government social research officer  . Crohn's disease Cousin   . Diabetes Paternal Aunt   .  Diabetes Paternal Grandfather   . Colon cancer Neg Hx     Allergies  Allergen Reactions  . Hydrocodone Nausea Only and Other (See Comments)    Hallucination   . Dilaudid [Hydromorphone Hcl]   . Latex Rash  . Penicillins Rash and Other (See Comments)    Childhood allergy    Medication list has been reviewed and updated.  Current Outpatient Prescriptions on File Prior to Visit  Medication Sig Dispense Refill  . Cholecalciferol (VITAMIN D) 2000 units CAPS Take by mouth.    . Cyanocobalamin (B-12) 1000 MCG SUBL Place 1 each under the tongue daily.    . diclofenac sodium (VOLTAREN) 1 % GEL Apply 2 g topically 3 (three) times daily as needed. Apply upto 3 grams tid prn; use sparingly on dry skin. 100 g 3  . escitalopram (LEXAPRO) 10 MG tablet Take 1 tablet (10 mg total) by mouth daily. 90 tablet 1  . fluticasone (FLONASE) 50 MCG/ACT nasal spray SHAKE LIQUID AND USE 2 SPRAYS IN EACH NOSTRIL DAILY 16 g 3  . Ginger, Zingiber officinalis, (GINGER PO) Take 1 capsule by mouth daily.    Marland Kitchen levocetirizine (XYZAL) 5 MG tablet TAKE 1 TABLET(5 MG) BY MOUTH EVERY EVENING 30 tablet 3  . levothyroxine (SYNTHROID) 100 MCG tablet Take 100 mcg by mouth daily before breakfast.    . Omega-3 Fatty Acids (FISH OIL) 1000 MG CAPS Take 2,000 mg by mouth 2 (two) times daily.    Marland Kitchen omeprazole (PRILOSEC) 20 MG capsule Take 1 capsule (20 mg total) by mouth daily. 30 capsule 3  . cyclobenzaprine (FLEXERIL) 10 MG tablet Take 1 tablet by mouth at bedtime as needed.    . doxycycline (VIBRA-TABS) 100 MG tablet Take 1 tablet (100 mg total) by mouth 2 (two) times daily. (Patient not taking: Reported on 11/15/2016) 20 tablet 0  . Elastic Bandages & Supports (TENNIS ELBOW NEOPRENE BRACE) MISC 1 Package by Other route as needed. Use as needed to left elbow to medial epicondyle or lateral epicondyle (Patient not taking: Reported on 11/15/2016) 1 each 0  . levocetirizine (XYZAL) 5 MG tablet TAKE 1 TABLET(5 MG) BY MOUTH EVERY EVENING  30 tablet 5  . OVER THE COUNTER MEDICATION Take 2 capsules by mouth 2 (two) times daily. TUMERIC    . PAZEO 0.7 % SOLN 1 drop each eye in the morning as needed.  2  . valACYclovir (VALTREX) 500 MG tablet Take 500 mg by mouth daily as needed.     No current facility-administered medications on file prior to visit.     Review of Systems:  As  per HPI- otherwise negative.  Physical Examination: Vitals:   11/15/16 1427  BP: 124/78  Pulse: 88  Resp: 14  Temp: 98.8 F (37.1 C)   Vitals:   11/15/16 1427  Weight: 224 lb 2 oz (101.7 kg)  Height: 5\' 9"  (1.753 m)   Body mass index is 33.1 kg/m. Ideal Body Weight: Weight in (lb) to have BMI = 25: 168.9  GEN: WDWN, NAD, Non-toxic, A & O x 3, overweight/ larger build.  Looks well HEENT: Atraumatic, Normocephalic. Neck supple. No masses, No LAD.  Bilateral TM wnl, oropharynx inflamed but no exudate  PEERL,EOMI.   Ears and Nose: No external deformity. CV: RRR, No M/G/R. No JVD. No thrill. No extra heart sounds. PULM: CTA B, no wheezes, crackles, rhonchi. No retractions. No resp. distress. No accessory muscle use. EXTR: No c/c/e NEURO Normal gait.  PSYCH: Normally interactive. Conversant. Not depressed or anxious appearing.  Calm demeanor.   Results for orders placed or performed in visit on 11/15/16  POCT rapid strep A  Result Value Ref Range   Rapid Strep A Screen Negative Negative    Assessment and Plan: Pharyngitis, unspecified etiology - Plan: POCT rapid strep A, Culture, Group A Strep, albuterol (PROVENTIL HFA;VENTOLIN HFA) 108 (90 Base) MCG/ACT inhaler, predniSONE (DELTASONE) 20 MG tablet  Seasonal allergic rhinitis due to pollen - Plan: predniSONE (DELTASONE) 20 MG tablet  Here today with illness.  Recent treatment for strep Rapid strep negative today- will send a culture However suspect current sx are due to allergies/ viral illness Will treat with prednisone for 6 days, refilled inhaler that she uses prn  She will let me  know if not improving soon  Signed Lamar Blinks, MD

## 2016-11-15 NOTE — Progress Notes (Signed)
Pre visit review using our clinic review tool, if applicable. No additional management support is needed unless otherwise documented below in the visit note. 

## 2016-11-17 LAB — CULTURE, GROUP A STREP

## 2016-12-01 ENCOUNTER — Encounter: Payer: Self-pay | Admitting: Family

## 2016-12-01 ENCOUNTER — Ambulatory Visit (INDEPENDENT_AMBULATORY_CARE_PROVIDER_SITE_OTHER): Payer: BLUE CROSS/BLUE SHIELD | Admitting: Family

## 2016-12-01 VITALS — BP 141/79 | HR 71 | Temp 98.3°F | Resp 18 | Ht 69.0 in | Wt 221.8 lb

## 2016-12-01 DIAGNOSIS — R32 Unspecified urinary incontinence: Secondary | ICD-10-CM

## 2016-12-01 DIAGNOSIS — J302 Other seasonal allergic rhinitis: Secondary | ICD-10-CM | POA: Diagnosis not present

## 2016-12-01 DIAGNOSIS — F32A Depression, unspecified: Secondary | ICD-10-CM

## 2016-12-01 DIAGNOSIS — E039 Hypothyroidism, unspecified: Secondary | ICD-10-CM | POA: Diagnosis not present

## 2016-12-01 DIAGNOSIS — F329 Major depressive disorder, single episode, unspecified: Secondary | ICD-10-CM

## 2016-12-01 LAB — TSH: TSH: 7.36 u[IU]/mL — AB (ref 0.35–4.50)

## 2016-12-01 MED ORDER — ESCITALOPRAM OXALATE 20 MG PO TABS: 20.0000 mg | ORAL_TABLET | Freq: Every day | ORAL | 1 refills | Status: DC

## 2016-12-01 MED ORDER — MONTELUKAST SODIUM 10 MG PO TABS: 10.0000 mg | ORAL_TABLET | Freq: Every day | ORAL | 5 refills | Status: DC

## 2016-12-01 NOTE — Assessment & Plan Note (Signed)
+   fatigue, check follow up TSH. Continue synthroid.

## 2016-12-01 NOTE — Patient Instructions (Signed)
Increase lexapro from 10mg  to 20mg . Begin singulair for your allergies. You will be contacted about your referral to Urogynecology and to the Allergist.

## 2016-12-01 NOTE — Assessment & Plan Note (Signed)
Uncontrolled. Having some anxiety as well.  Will increase lexapro from 10mg  to 20mg .

## 2016-12-01 NOTE — Progress Notes (Signed)
Subjective:    Patient ID: Christine [name], female    DOB: 26-Dec-1970, 46 y.o.   MRN: [id number]  HPI  Christine [name] is a 46 yr old female who presents today for follow up.  1) Depression- reports that she has had some anxiety, feels "like I want to get away, I can't handle this." Reports poor motivation, "tired all the time. " Denies SI.  Wt Readings from Last 3 Encounters:  12/01/16 221 lb 12.8 oz (100.6 kg)  11/15/16 224 lb 2 oz (101.7 kg)  07/30/16 220 lb 12.8 oz (100.2 kg)    2) Hypothyroid- maintained on synthroid.  Lab Results  Component Value Date   TSH 3.11 06/04/2016   3) Allergies- notes that allergies have been worse since mid-march.  On flonase, zyxal.   Still having a lot of nasal drainage.   4) urinary incontinence- notes stress incontinence.  Has some leakage even after voiding.  Leakage during intercourse.   Review of Systems See HPI  Past Medical History:  Diagnosis Date  . Allergy   . Anemia    stated h/o pernicious anemia  . Arthritis    osteoarthritis  . Asthma    Acute asthmatic bronchitis 2011 while pregnant  . Carpal tunnel syndrome    both wrist  . Depression   . Diverticulitis    recurrent  . Family history of anesthesia complication    nausea and vomiting  . Fibromyalgia   . GERD (gastroesophageal reflux disease)   . Hypothyroidism   . Migraine, unspecified, without mention of intractable migraine without mention of status migrainosus   . Miscarriage 2007  . PONV (postoperative nausea and vomiting)   . Thyroid disease      Social History   Social History  . Marital status: Married    Spouse name: N/A  . Number of children: 2  . Years of education: N/A   Occupational History  . Not on file.   Social History Main Topics  . Smoking status: Former Smoker    Packs/day: 0.50    Years: 3.00    Types: Cigarettes    Quit date: 01/31/2000  . Smokeless tobacco: Never Used  . Alcohol use 1.2 oz/week    2 Glasses of wine per week       Comment: social  . Drug use: No  . Sexual activity: Yes    Birth control/ protection: IUD   Other Topics Concern  . Not on file   Social History Narrative   Married, 2 daughters   Occupation: Housewife   Daily Caffeine Use   Patient does not get regular exercise.    Past Surgical History:  Procedure Laterality Date  . CHOLECYSTECTOMY N/A 03/25/2014   Procedure: LAPAROSCOPIC CHOLECYSTECTOMY;  Surgeon: Ralene Ok, MD;  Location: [facility];  Service: General;  Laterality: N/A;  . DILATION AND CURETTAGE OF UTERUS     miscarriage  . LAPAROSCOPY  5/94   x 2  . SIGMOIDECTOMY     Hand-assisted laparoscopic sigmoidectomy with splenic flexure takedown.    Family History  Problem Relation Age of Onset  . Barrett's esophagus Mother   . Coronary artery disease Mother   . Congestive Heart Failure Mother   . Hypertension Mother   . Fibromyalgia Mother   . Chronic fatigue Mother   . Anemia Mother   . Irritable bowel syndrome Mother   . Atrial fibrillation Mother   . Colon polyps Father   . Diabetes Father   . Hypertension  Father   . Heart disease Father   . Ehlers-Danlos syndrome Daughter   . Anemia Daughter   . Other Daughter     Postural orthostatic hypotension  . Irritable bowel syndrome Daughter   . Alcohol abuse Other   . Arthritis Other   . Hyperlipidemia Other   . Hypertension Other   . Stroke Other   . Coronary artery disease Other   . Clotting disorder Other     Government social research officer  . Crohn's disease Cousin   . Diabetes Paternal Aunt   . Diabetes Paternal Grandfather   . Colon cancer Neg Hx     Allergies  Allergen Reactions  . Hydrocodone Nausea Only and Other (See Comments)    Hallucination   . Dilaudid [Hydromorphone Hcl]   . Latex Rash  . Penicillins Rash and Other (See Comments)    Childhood allergy    Current Outpatient Prescriptions on File Prior to Visit  Medication Sig Dispense Refill  . albuterol (PROVENTIL HFA;VENTOLIN HFA) 108 (90 Base)  MCG/ACT inhaler Inhale 2 puffs into the lungs every 6 (six) hours as needed for wheezing or shortness of breath. 1 Inhaler 1  . Cholecalciferol (VITAMIN D) 2000 units CAPS Take by mouth.    . Cyanocobalamin (B-12) 1000 MCG SUBL Place 1 each under the tongue daily.    . cyclobenzaprine (FLEXERIL) 10 MG tablet Take 1 tablet by mouth at bedtime as needed.    . diclofenac sodium (VOLTAREN) 1 % GEL Apply 2 g topically 3 (three) times daily as needed. Apply upto 3 grams tid prn; use sparingly on dry skin. 100 g 3  . fluticasone (FLONASE) 50 MCG/ACT nasal spray SHAKE LIQUID AND USE 2 SPRAYS IN EACH NOSTRIL DAILY 16 g 3  . Ginger, Zingiber officinalis, (GINGER PO) Take 1 capsule by mouth daily.    Marland Kitchen levocetirizine (XYZAL) 5 MG tablet TAKE 1 TABLET(5 MG) BY MOUTH EVERY EVENING 30 tablet 5  . levocetirizine (XYZAL) 5 MG tablet TAKE 1 TABLET(5 MG) BY MOUTH EVERY EVENING 30 tablet 3  . levothyroxine (SYNTHROID) 100 MCG tablet Take 100 mcg by mouth daily before breakfast.    . Omega-3 Fatty Acids (FISH OIL) 1000 MG CAPS Take 2,000 mg by mouth 2 (two) times daily.    Marland Kitchen omeprazole (PRILOSEC) 20 MG capsule Take 1 capsule (20 mg total) by mouth daily. 30 capsule 3  . OVER THE COUNTER MEDICATION Take 2 capsules by mouth 2 (two) times daily. TUMERIC    . PAZEO 0.7 % SOLN 1 drop each eye in the morning as needed.  2  . predniSONE (DELTASONE) 20 MG tablet Take 2 pills a day for 3 days, then 1 pill a day for 3 days 9 tablet 0  . valACYclovir (VALTREX) 500 MG tablet Take 500 mg by mouth daily as needed.     No current facility-administered medications on file prior to visit.     BP (!) 141/79 (BP Location: Right Arm, Cuff Size: Large)   Pulse 71   Temp 98.3 F (36.8 C) (Oral)   Resp 18   Ht 5\' 9"  (1.753 m)   Wt 221 lb 12.8 oz (100.6 kg)   SpO2 100% Comment: room air  BMI 32.75 kg/m       Objective:   Physical Exam  Constitutional: She is oriented to person, place, and time. She appears well-developed and  well-nourished.  HENT:  Head: Normocephalic and atraumatic.  Cardiovascular: Normal rate, regular rhythm and normal heart sounds.   No  murmur heard. Pulmonary/Chest: Effort normal and breath sounds normal. No respiratory distress. She has no wheezes.  Musculoskeletal: She exhibits no edema.  Neurological: She is alert and oriented to person, place, and time.  Psychiatric: She has a normal mood and affect. Her behavior is normal. Judgment and thought content normal.          Assessment & Plan:

## 2016-12-01 NOTE — Progress Notes (Signed)
Pre visit review using our clinic review tool, if applicable. No additional management support is needed unless otherwise documented below in the visit note. 

## 2016-12-02 ENCOUNTER — Telehealth: Payer: Self-pay | Admitting: Family

## 2016-12-02 DIAGNOSIS — E038 Other specified hypothyroidism: Secondary | ICD-10-CM

## 2016-12-02 MED ORDER — LEVOTHYROXINE SODIUM 125 MCG PO TABS: 125.0000 ug | ORAL_TABLET | Freq: Every day | ORAL | 3 refills | Status: DC

## 2016-12-02 NOTE — Telephone Encounter (Signed)
Lab work shows synthroid needs to be increased to 125 mcg daily. Repeat tsh in 6 weeks.

## 2016-12-03 ENCOUNTER — Ambulatory Visit: Payer: BLUE CROSS/BLUE SHIELD | Admitting: Family

## 2016-12-07 NOTE — Telephone Encounter (Signed)
Mychart message sent to pt. Pt has follow up on 01/11/17 and will recheck TSH at that visit.

## 2016-12-20 ENCOUNTER — Telehealth: Payer: Self-pay | Admitting: Family

## 2016-12-20 ENCOUNTER — Emergency Department (HOSPITAL_BASED_OUTPATIENT_CLINIC_OR_DEPARTMENT_OTHER)
Admission: EM | Admit: 2016-12-20 | Discharge: 2016-12-20 | Disposition: A | Discharge status: Home / Self Care | Payer: BLUE CROSS/BLUE SHIELD | Attending: Physician Assistant | Admitting: Physician Assistant

## 2016-12-20 ENCOUNTER — Emergency Department (HOSPITAL_BASED_OUTPATIENT_CLINIC_OR_DEPARTMENT_OTHER): Payer: BLUE CROSS/BLUE SHIELD

## 2016-12-20 ENCOUNTER — Encounter (HOSPITAL_BASED_OUTPATIENT_CLINIC_OR_DEPARTMENT_OTHER): Payer: Self-pay | Admitting: Emergency Medicine

## 2016-12-20 DIAGNOSIS — R079 Chest pain, unspecified: Secondary | ICD-10-CM | POA: Diagnosis not present

## 2016-12-20 DIAGNOSIS — M940 Chondrocostal junction syndrome [Tietze]: Secondary | ICD-10-CM | POA: Diagnosis not present

## 2016-12-20 DIAGNOSIS — J45909 Unspecified asthma, uncomplicated: Secondary | ICD-10-CM | POA: Insufficient documentation

## 2016-12-20 DIAGNOSIS — R0789 Other chest pain: Secondary | ICD-10-CM | POA: Diagnosis present

## 2016-12-20 DIAGNOSIS — E039 Hypothyroidism, unspecified: Secondary | ICD-10-CM | POA: Diagnosis not present

## 2016-12-20 DIAGNOSIS — Z79899 Other long term (current) drug therapy: Secondary | ICD-10-CM | POA: Insufficient documentation

## 2016-12-20 DIAGNOSIS — Z87891 Personal history of nicotine dependence: Secondary | ICD-10-CM | POA: Diagnosis not present

## 2016-12-20 DIAGNOSIS — Z0389 Encounter for observation for other suspected diseases and conditions ruled out: Secondary | ICD-10-CM | POA: Diagnosis not present

## 2016-12-20 LAB — BASIC METABOLIC PANEL
Anion gap: 6 (ref 5–15)
BUN: 11 mg/dL (ref 6–20)
CALCIUM: 9.5 mg/dL (ref 8.9–10.3)
CO2: 28 mmol/L (ref 22–32)
Chloride: 104 mmol/L (ref 101–111)
Creatinine, Ser: 0.65 mg/dL (ref 0.44–1.00)
GFR calc Af Amer: >60 mL/min (ref >60)
GLUCOSE: 90 mg/dL (ref 65–99)
Potassium: 4.1 mmol/L (ref 3.5–5.1)
SODIUM: 138 mmol/L (ref 135–145)

## 2016-12-20 LAB — CBC
HCT: 38.6 % (ref 36.0–46.0)
Hemoglobin: 13.3 g/dL (ref 12.0–15.0)
MCH: 30.6 pg (ref 26.0–34.0)
MCHC: 34.5 g/dL (ref 30.0–36.0)
MCV: 88.9 fL (ref 78.0–100.0)
PLATELETS: 263 10*3/uL (ref 150–400)
RBC: 4.34 MIL/uL (ref 3.87–5.11)
RDW: 13 % (ref 11.5–15.5)
WBC: 7.8 10*3/uL (ref 4.0–10.5)

## 2016-12-20 LAB — TROPONIN I

## 2016-12-20 MED ORDER — CYCLOBENZAPRINE HCL 10 MG PO TABS: 10.0000 mg | ORAL_TABLET | Freq: Two times a day (BID) | ORAL | 0 refills | Status: DC | PRN

## 2016-12-20 MED ORDER — IBUPROFEN 800 MG PO TABS: 800.0000 mg | ORAL_TABLET | Freq: Three times a day (TID) | ORAL | 0 refills | Status: DC

## 2016-12-20 NOTE — ED Provider Notes (Signed)
[facility] DEPT [facility] Provider Note   CSN: [id number] Arrival date & time: 12/20/16  1643  By signing my name below, I, Christine [name], attest that this documentation has been prepared under the direction and in the presence of physician practitioner, Thomasene Lot, Fredia Sorrow, MD. Electronically Signed: Dora [name], Scribe. 12/20/2016. 6:14 PM.  History   Chief Complaint Chief Complaint  Patient presents with  . Chest Pain   The history is provided by the patient. No language interpreter was used.    HPI Comments: Christine [name] is a 46 y.o. female who presents to the Emergency Department complaining of constant, dull, aching, left-sided chest pain beginning around 2 PM this afternoon. She states her pain is worse with respiration and applied pressure to the left side of her chest. Patient notes her chest pain is not exacerbated by movement. No alleviating factors noted. Patient reports some recent nasal congestion and a non-productive cough for a few days as well. She notes two of her grandparents both had heart attacks in their 34's. No other pertinent cardiac FMHx. No personal h/o HTN, HLD, or DM. No recent immobilizations. No hormone therapy. She denies leg swelling, dyspnea, numbness/tingling, focal weakness, nausea, vomiting, or any other associated symptoms.  Past Medical History:  Diagnosis Date  . Allergy   . Anemia    stated h/o pernicious anemia  . Arthritis    osteoarthritis  . Asthma    Acute asthmatic bronchitis 2011 while pregnant  . Carpal tunnel syndrome    both wrist  . Depression   . Diverticulitis    recurrent  . Family history of anesthesia complication    nausea and vomiting  . Fibromyalgia   . GERD (gastroesophageal reflux disease)   . Hypothyroidism   . Migraine, unspecified, without mention of intractable migraine without mention of status migrainosus   . Miscarriage 2007  . PONV (postoperative nausea and vomiting)   . Thyroid disease      Patient Active Problem List   Diagnosis Date Noted  . Trapezius muscle spasm 10/21/2016  . Fibromyalgia 05/24/2016  . Chronic fatigue 05/24/2016  . Insomnia 05/24/2016  . Osteoarthritis of both hands 05/24/2016  . DJD (degenerative joint disease), cervical 05/24/2016  . Hyperlipidemia 05/21/2015  . Depression 05/21/2015  . Heel pain 11/19/2014  . Carpal tunnel syndrome 11/19/2014  . Preventative health care 11/19/2014  . Hypothyroidism 06/13/2011  . Allergic rhinitis 12/19/2007  . GASTROESOPHAGEAL REFLUX DISEASE 09/27/2007    Past Surgical History:  Procedure Laterality Date  . CHOLECYSTECTOMY N/A 03/25/2014   Procedure: LAPAROSCOPIC CHOLECYSTECTOMY;  Surgeon: Ralene Ok, MD;  Location: [facility];  Service: General;  Laterality: N/A;  . DILATION AND CURETTAGE OF UTERUS     miscarriage  . LAPAROSCOPY  5/94   x 2  . SIGMOIDECTOMY     Hand-assisted laparoscopic sigmoidectomy with splenic flexure takedown.    OB History    No data available       Home Medications    Prior to Admission medications   Medication Sig Start Date End Date Taking? Authorizing Provider  albuterol (PROVENTIL HFA;VENTOLIN HFA) 108 (90 Base) MCG/ACT inhaler Inhale 2 puffs into the lungs every 6 (six) hours as needed for wheezing or shortness of breath. 11/15/16   Copland, Gay Filler, MD  Cholecalciferol (VITAMIN D) 2000 units CAPS Take by mouth.    [provider]  Cyanocobalamin (B-12) 1000 MCG SUBL Place 1 each under the tongue daily.    [provider]  cyclobenzaprine (FLEXERIL) 10  MG tablet Take 1 tablet by mouth at bedtime as needed. 05/25/16   [provider]  cyclobenzaprine (FLEXERIL) 10 MG tablet Take 1 tablet (10 mg total) by mouth 2 (two) times daily as needed for muscle spasms. 12/20/16   Lucilia Yanni Lyn, MD  diclofenac sodium (VOLTAREN) 1 % GEL Apply 2 g topically 3 (three) times daily as needed. Apply upto 3 grams tid prn; use sparingly on dry skin.  05/25/16   Panwala, Naitik, PA-C  escitalopram (LEXAPRO) 20 MG tablet Take 1 tablet (20 mg total) by mouth daily. 12/01/16   Debbrah Alar, NP  fluticasone (FLONASE) 50 MCG/ACT nasal spray SHAKE LIQUID AND USE 2 SPRAYS IN EACH NOSTRIL DAILY 07/09/16   Debbrah Alar, NP  Ginger, Zingiber officinalis, (GINGER PO) Take 1 capsule by mouth daily.    [provider]  ibuprofen (ADVIL,MOTRIN) 800 MG tablet Take 1 tablet (800 mg total) by mouth 3 (three) times daily. 12/20/16   Lovetta Condie Lyn, MD  levocetirizine (XYZAL) 5 MG tablet TAKE 1 TABLET(5 MG) BY MOUTH EVERY EVENING 03/08/16   Debbrah Alar, NP  levocetirizine (XYZAL) 5 MG tablet TAKE 1 TABLET(5 MG) BY MOUTH EVERY EVENING 09/08/16   Debbrah Alar, NP  levothyroxine (SYNTHROID, LEVOTHROID) 125 MCG tablet Take 1 tablet (125 mcg total) by mouth daily. 12/02/16   Debbrah Alar, NP  montelukast (SINGULAIR) 10 MG tablet Take 1 tablet (10 mg total) by mouth at bedtime. 12/01/16   Debbrah Alar, NP  Omega-3 Fatty Acids (FISH OIL) 1000 MG CAPS Take 2,000 mg by mouth 2 (two) times daily.    [provider]  omeprazole (PRILOSEC) 20 MG capsule Take 1 capsule (20 mg total) by mouth daily. 01/01/14   Brunetta Jeans, PA-C  OVER THE COUNTER MEDICATION Take 2 capsules by mouth 2 (two) times daily. TUMERIC    [provider]  PAZEO 0.7 % SOLN 1 drop each eye in the morning as needed. 11/07/14   [provider]  predniSONE (DELTASONE) 20 MG tablet Take 2 pills a day for 3 days, then 1 pill a day for 3 days 11/15/16   Copland, Gay Filler, MD  valACYclovir (VALTREX) 500 MG tablet Take 500 mg by mouth daily as needed. 03/16/16   [provider]    Family History Family History  Problem Relation Age of Onset  . Barrett's esophagus Mother   . Coronary artery disease Mother   . Congestive Heart Failure Mother   . Hypertension Mother   . Fibromyalgia Mother   . Chronic fatigue Mother   .  Anemia Mother   . Irritable bowel syndrome Mother   . Atrial fibrillation Mother   . Colon polyps Father   . Diabetes Father   . Hypertension Father   . Heart disease Father   . Ehlers-Danlos syndrome Daughter   . Anemia Daughter   . Other Daughter        Postural orthostatic hypotension  . Irritable bowel syndrome Daughter   . Alcohol abuse Other   . Arthritis Other   . Hyperlipidemia Other   . Hypertension Other   . Stroke Other   . Coronary artery disease Other   . Clotting disorder Other        Government social research officer  . Crohn's disease Cousin   . Diabetes Paternal Aunt   . Diabetes Paternal Grandfather   . Colon cancer Neg Hx     Social History Social History  Substance Use Topics  . Smoking status: Former Smoker  Packs/day: 0.50    Years: 3.00    Types: Cigarettes    Quit date: 01/31/2000  . Smokeless tobacco: Never Used  . Alcohol use 1.2 oz/week    2 Glasses of wine per week     Comment: social     Allergies   Hydrocodone; Dilaudid [hydromorphone hcl]; Latex; and Penicillins   Review of Systems Review of Systems  HENT: Positive for congestion.   Respiratory: Positive for cough.   Cardiovascular: Positive for chest pain. Negative for leg swelling.  Gastrointestinal: Negative for nausea and vomiting.  Neurological: Negative for weakness and numbness.   Physical Exam Updated Vital Signs BP 126/73   Pulse 65   Temp 98.8 F (37.1 C) (Oral)   Resp 14   Ht 5\' 9"  (1.753 m)   Wt 99.8 kg (220 lb)   SpO2 97%   BMI 32.49 kg/m   Physical Exam  Constitutional: She is oriented to person, place, and time. She appears well-developed and well-nourished. No distress.  HENT:  Head: Normocephalic and atraumatic.  Eyes: Conjunctivae and EOM are normal.  Neck: Neck supple. No tracheal deviation present.  Cardiovascular: Normal rate, regular rhythm, normal heart sounds and intact distal pulses.   Pulmonary/Chest: Effort normal and breath sounds normal. No  respiratory distress. She exhibits tenderness.  Mild TTP to the lateral left sternum.  Musculoskeletal: Normal range of motion.  Neurological: She is alert and oriented to person, place, and time.  Skin: Skin is warm and dry.  Psychiatric: She has a normal mood and affect. Her behavior is normal.  Nursing note and vitals reviewed.  ED Treatments / Results  Labs (all labs ordered are listed, but only abnormal results are displayed) Labs Reviewed  BASIC METABOLIC PANEL  CBC  TROPONIN I  TROPONIN I    EKG  EKG Interpretation  Date/Time:  Monday Dec 20 2016 16:48:29 EDT Ventricular Rate:  71 PR Interval:  130 QRS Duration: 90 QT Interval:  368 QTC Calculation: 399 R Axis:   72 Text Interpretation:  Normal sinus rhythm Normal ECG No significant change since last tracing Confirmed by Zenovia Jarred [phone]) on 12/20/2016 6:00:45 PM       Radiology Dg Chest 2 View  Result Date: 12/20/2016 CLINICAL DATA:  Left-sided chest pain EXAM: CHEST  2 VIEW COMPARISON:  03/20/2014 FINDINGS: The heart size and mediastinal contours are within normal limits. Both lungs are clear. The visualized skeletal structures are unremarkable. IMPRESSION: No active cardiopulmonary disease. Electronically Signed   By: Donavan Foil M.D.   On: 12/20/2016 17:05    Procedures Procedures (including critical care time)  DIAGNOSTIC STUDIES: Oxygen Saturation is 99% on RA, normal by my interpretation.    COORDINATION OF CARE: 6:13 PM Discussed treatment plan with pt at bedside and pt agreed to plan.  Medications Ordered in ED Medications - No data to display   Initial Impression / Assessment and Plan / ED Course  I have reviewed the triage vital signs and the nursing notes.  Pertinent labs & imaging results that were available during my care of the patient were reviewed by me and considered in my medical decision making (see chart for details).    I personally performed the services described in  this documentation, which was scribed in my presence. The recorded information has been reviewed and is accurate.   Well-appearing 46 year old female with no hypertension hyperlipidemia and no diabetes no family history of early cardiac death presenting with chest pain. Heart score 2.  Patient  has recent upper respiratory illness. Patient has no risk factors for PE. EKG is nonischemic. We'll do delta troponin and then plan to discharge home. I think this likely costochondritis.  Final Clinical Impressions(s) / ED Diagnoses   Final diagnoses:  Costochondritis    New Prescriptions Discharge Medication List as of 12/20/2016  9:43 PM    START taking these medications   Details  !! cyclobenzaprine (FLEXERIL) 10 MG tablet Take 1 tablet (10 mg total) by mouth 2 (two) times daily as needed for muscle spasms., Starting Mon 12/20/2016, Print    ibuprofen (ADVIL,MOTRIN) 800 MG tablet Take 1 tablet (800 mg total) by mouth 3 (three) times daily., Starting Mon 12/20/2016, Print     !! - Potential duplicate medications found. Please discuss with provider.       Macarthur Critchley, MD 12/20/16 2232

## 2016-12-20 NOTE — Telephone Encounter (Signed)
Pt came with chest pain left side above breast constant since 2:00 pm today. Radiates some into left arm. Pt states no hx BP problems. Pt states inhaler and singulair for allergies. Pt states feels diff than heartburn. Pt states was thinking we could check her vitals before she goes to ED. Pt then decided to go straight immediately to ED.

## 2016-12-20 NOTE — Discharge Instructions (Signed)
All other lab work, chest x-ray, EKG were very reassuring. Please follow-up with her primary care provider. We think this is likely due to muscle spasm/muscle pain in your chest wall. He may use ibuprofen to help with the pain and muscle relaxants provided to help with the spasm.

## 2016-12-20 NOTE — ED Triage Notes (Signed)
L side chest pain since last night, radiating down L arm today. Mild SOB, denies N/V. Describes pain as sharp.

## 2016-12-20 NOTE — ED Notes (Signed)
Pt reports L sided chest pain which was present last night and then resolved on its own. Pain returned today around 14:00 after showering. Pt had associated ShOB. Denies lightheadedness, nausea, or diaphoresis with onset. Pt describes the pain as dull which radiates down L arm. Chest wall is tender with pain reproducible with palpation, movement, and deep inspiration.

## 2016-12-21 NOTE — Telephone Encounter (Signed)
Noted and agree. 

## 2016-12-31 ENCOUNTER — Other Ambulatory Visit: Payer: Self-pay | Admitting: Family

## 2017-01-04 ENCOUNTER — Encounter: Payer: Self-pay | Admitting: Family

## 2017-01-11 ENCOUNTER — Ambulatory Visit (INDEPENDENT_AMBULATORY_CARE_PROVIDER_SITE_OTHER): Payer: BLUE CROSS/BLUE SHIELD | Admitting: Family

## 2017-01-11 ENCOUNTER — Encounter: Payer: Self-pay | Admitting: Family

## 2017-01-11 VITALS — BP 112/78 | HR 60 | Temp 98.6°F | Resp 16 | Ht 69.0 in | Wt 213.6 lb

## 2017-01-11 DIAGNOSIS — F32A Depression, unspecified: Secondary | ICD-10-CM

## 2017-01-11 DIAGNOSIS — E039 Hypothyroidism, unspecified: Secondary | ICD-10-CM | POA: Diagnosis not present

## 2017-01-11 DIAGNOSIS — F329 Major depressive disorder, single episode, unspecified: Secondary | ICD-10-CM | POA: Diagnosis not present

## 2017-01-11 DIAGNOSIS — J309 Allergic rhinitis, unspecified: Secondary | ICD-10-CM

## 2017-01-11 LAB — TSH: TSH: 0.75 u[IU]/mL (ref 0.35–4.50)

## 2017-01-11 MED ORDER — ESCITALOPRAM OXALATE 20 MG PO TABS: 20.0000 mg | ORAL_TABLET | Freq: Every day | ORAL | 1 refills | Status: DC

## 2017-01-11 NOTE — Patient Instructions (Addendum)
Please complete lab work prior to leaving.   

## 2017-01-11 NOTE — Assessment & Plan Note (Signed)
Much better on increased dose of lexapro. Continue current dose.

## 2017-01-11 NOTE — Assessment & Plan Note (Signed)
Improved with singulair. Continue same- she may want to try to come off of it seasonally since she does better in the summer and I advised her that this is ok.

## 2017-01-11 NOTE — Progress Notes (Signed)
Subjective:    Patient ID: Christine [name], female    DOB: 15-Sep-1970, 46 y.o.   MRN: [id number]  HPI   [name] is a 46 yr old female who presents today for follow up.  1) Depression- last visit we increased her lexapro from 10mg  to 20mg . She reports feeling much better. Has more energy and motivation. Has started working out and dieting and has lost 7 pounds in the last 3 weeks.  Wt Readings from Last 3 Encounters:  01/11/17 213 lb 9.6 oz (96.9 kg)  12/20/16 220 lb (99.8 kg)  12/01/16 221 lb 12.8 oz (100.6 kg)   2) Hypothyroid- Last visit TSH was elevated and we increased her synthroid. She reports feeling  Much better with this adjustment.  Lab Results  Component Value Date   TSH 7.36 (H) 12/01/2016   3) Allergic rhinitis- report improvement in her symptoms with the addition of singulair.  Review of Systems    see HPI  Past Medical History:  Diagnosis Date  . Allergy   . Anemia    stated h/o pernicious anemia  . Arthritis    osteoarthritis  . Asthma    Acute asthmatic bronchitis 2011 while pregnant  . Carpal tunnel syndrome    both wrist  . Depression   . Diverticulitis    recurrent  . Family history of anesthesia complication    nausea and vomiting  . Fibromyalgia   . GERD (gastroesophageal reflux disease)   . Hypothyroidism   . Migraine, unspecified, without mention of intractable migraine without mention of status migrainosus   . Miscarriage 2007  . PONV (postoperative nausea and vomiting)   . Thyroid disease      Social History   Social History  . Marital status: Married    Spouse name: N/A  . Number of children: 2  . Years of education: N/A   Occupational History  . Not on file.   Social History Main Topics  . Smoking status: Former Smoker    Packs/day: 0.50    Years: 3.00    Types: Cigarettes    Quit date: 01/31/2000  . Smokeless tobacco: Never Used  . Alcohol use 1.2 oz/week    2 Glasses of wine per week     Comment: social  .  Drug use: No  . Sexual activity: Yes    Birth control/ protection: IUD   Other Topics Concern  . Not on file   Social History Narrative   Married, 2 daughters   Occupation: Housewife   Daily Caffeine Use   Patient does not get regular exercise.    Past Surgical History:  Procedure Laterality Date  . CHOLECYSTECTOMY N/A 03/25/2014   Procedure: LAPAROSCOPIC CHOLECYSTECTOMY;  Surgeon: Ralene Ok, MD;  Location: [facility];  Service: General;  Laterality: N/A;  . DILATION AND CURETTAGE OF UTERUS     miscarriage  . LAPAROSCOPY  5/94   x 2  . SIGMOIDECTOMY     Hand-assisted laparoscopic sigmoidectomy with splenic flexure takedown.    Family History  Problem Relation Age of Onset  . Barrett's esophagus Mother   . Coronary artery disease Mother   . Congestive Heart Failure Mother   . Hypertension Mother   . Fibromyalgia Mother   . Chronic fatigue Mother   . Anemia Mother   . Irritable bowel syndrome Mother   . Atrial fibrillation Mother   . Colon polyps Father   . Diabetes Father   . Hypertension Father   . Heart  disease Father   . Ehlers-Danlos syndrome Daughter   . Anemia Daughter   . Other Daughter        Postural orthostatic hypotension  . Irritable bowel syndrome Daughter   . Alcohol abuse Other   . Arthritis Other   . Hyperlipidemia Other   . Hypertension Other   . Stroke Other   . Coronary artery disease Other   . Clotting disorder Other        Government social research officer  . Crohn's disease Cousin   . Diabetes Paternal Aunt   . Diabetes Paternal Grandfather   . Colon cancer Neg Hx     Allergies  Allergen Reactions  . Hydrocodone Nausea Only and Other (See Comments)    Hallucination   . Dilaudid [Hydromorphone Hcl]   . Latex Rash  . Penicillins Rash and Other (See Comments)    Childhood allergy    Current Outpatient Prescriptions on File Prior to Visit  Medication Sig Dispense Refill  . albuterol (PROVENTIL HFA;VENTOLIN HFA) 108 (90 Base) MCG/ACT inhaler  Inhale 2 puffs into the lungs every 6 (six) hours as needed for wheezing or shortness of breath. 1 Inhaler 1  . Cholecalciferol (VITAMIN D) 2000 units CAPS Take by mouth.    . Cyanocobalamin (B-12) 1000 MCG SUBL Place 1 each under the tongue daily.    . cyclobenzaprine (FLEXERIL) 10 MG tablet Take 1 tablet by mouth at bedtime as needed.    . cyclobenzaprine (FLEXERIL) 10 MG tablet Take 1 tablet (10 mg total) by mouth 2 (two) times daily as needed for muscle spasms. 10 tablet 0  . diclofenac sodium (VOLTAREN) 1 % GEL Apply 2 g topically 3 (three) times daily as needed. Apply upto 3 grams tid prn; use sparingly on dry skin. 100 g 3  . escitalopram (LEXAPRO) 20 MG tablet Take 1 tablet (20 mg total) by mouth daily. 30 tablet 1  . fluticasone (FLONASE) 50 MCG/ACT nasal spray SHAKE LIQUID AND USE 2 SPRAYS IN EACH NOSTRIL DAILY 16 g 3  . Ginger, Zingiber officinalis, (GINGER PO) Take 1 capsule by mouth daily.    Marland Kitchen ibuprofen (ADVIL,MOTRIN) 800 MG tablet Take 1 tablet (800 mg total) by mouth 3 (three) times daily. 21 tablet 0  . levocetirizine (XYZAL) 5 MG tablet TAKE 1 TABLET(5 MG) BY MOUTH EVERY EVENING 30 tablet 5  . levothyroxine (SYNTHROID, LEVOTHROID) 125 MCG tablet Take 1 tablet (125 mcg total) by mouth daily. 30 tablet 3  . montelukast (SINGULAIR) 10 MG tablet Take 1 tablet (10 mg total) by mouth at bedtime. 30 tablet 5  . Omega-3 Fatty Acids (FISH OIL) 1000 MG CAPS Take 2,000 mg by mouth 2 (two) times daily.    Marland Kitchen omeprazole (PRILOSEC) 20 MG capsule Take 1 capsule (20 mg total) by mouth daily. 30 capsule 3  . OVER THE COUNTER MEDICATION Take 2 capsules by mouth 2 (two) times daily. TUMERIC    . PAZEO 0.7 % SOLN 1 drop each eye in the morning as needed.  2  . valACYclovir (VALTREX) 500 MG tablet Take 500 mg by mouth daily as needed.     No current facility-administered medications on file prior to visit.     BP 112/78 (BP Location: Left Arm, Cuff Size: Large)   Pulse 60   Temp 98.6 F (37 C)  (Oral)   Resp 16   Ht 5\' 9"  (1.753 m)   Wt 213 lb 9.6 oz (96.9 kg)   SpO2 100%   BMI 31.54 kg/m  Objective:   Physical Exam  Constitutional: She is oriented to person, place, and time. She appears well-developed and well-nourished. No distress.  Eyes: Conjunctivae are normal. No scleral icterus.  Musculoskeletal: She exhibits no edema.  Neurological: She is alert and oriented to person, place, and time.  Psychiatric: She has a normal mood and affect. Her behavior is normal. Judgment and thought content normal.          Assessment & Plan:

## 2017-04-13 ENCOUNTER — Encounter: Payer: Self-pay | Admitting: Family

## 2017-04-13 ENCOUNTER — Ambulatory Visit (INDEPENDENT_AMBULATORY_CARE_PROVIDER_SITE_OTHER): Payer: BLUE CROSS/BLUE SHIELD | Admitting: Family

## 2017-04-13 VITALS — BP 121/62 | HR 56 | Temp 98.7°F | Resp 18 | Ht 69.0 in | Wt 203.0 lb

## 2017-04-13 DIAGNOSIS — J309 Allergic rhinitis, unspecified: Secondary | ICD-10-CM

## 2017-04-13 DIAGNOSIS — F32A Depression, unspecified: Secondary | ICD-10-CM

## 2017-04-13 DIAGNOSIS — Z23 Encounter for immunization: Secondary | ICD-10-CM

## 2017-04-13 DIAGNOSIS — F329 Major depressive disorder, single episode, unspecified: Secondary | ICD-10-CM | POA: Diagnosis not present

## 2017-04-13 DIAGNOSIS — E039 Hypothyroidism, unspecified: Secondary | ICD-10-CM | POA: Diagnosis not present

## 2017-04-13 DIAGNOSIS — K219 Gastro-esophageal reflux disease without esophagitis: Secondary | ICD-10-CM

## 2017-04-13 NOTE — Progress Notes (Signed)
Subjective:    Patient ID: Christine [name], female    DOB: August 22, 1970, 46 y.o.   MRN: [id number]  HPI  Christine [name] is a 46 yr old female who presents today for follow up.   Depression- She notes significant improvement in her mood on Lexapro.  Hypothyroid-continue Synthroid. Reports feeling much better on current dose of levothyroxine. She felt that her thyroid was also contributing to her mood issues previously.  Allergic rhinitis-Continues xyzal/singulair and flonase. Reports symptoms are well controlled on this regimen.  GERD- continues omeprazole.  She purchases this over-the-counter.  Review of Systems   Past Medical History:  Diagnosis Date  . Allergy   . Anemia    stated h/o pernicious anemia  . Arthritis    osteoarthritis  . Asthma    Acute asthmatic bronchitis 2011 while pregnant  . Carpal tunnel syndrome    both wrist  . Depression   . Diverticulitis    recurrent  . Family history of anesthesia complication    nausea and vomiting  . Fibromyalgia   . GERD (gastroesophageal reflux disease)   . Hypothyroidism   . Migraine, unspecified, without mention of intractable migraine without mention of status migrainosus   . Miscarriage 2007  . PONV (postoperative nausea and vomiting)   . Thyroid disease      Social History   Social History  . Marital status: Married    Spouse name: N/A  . Number of children: 2  . Years of education: N/A   Occupational History  . Not on file.   Social History Main Topics  . Smoking status: Former Smoker    Packs/day: 0.50    Years: 3.00    Types: Cigarettes    Quit date: 01/31/2000  . Smokeless tobacco: Never Used  . Alcohol use 1.2 oz/week    2 Glasses of wine per week     Comment: social  . Drug use: No  . Sexual activity: Yes    Birth control/ protection: IUD   Other Topics Concern  . Not on file   Social History Narrative   Married, 2 daughters   Occupation: Housewife   Daily Caffeine Use   Patient does  not get regular exercise.    Past Surgical History:  Procedure Laterality Date  . CHOLECYSTECTOMY N/A 03/25/2014   Procedure: LAPAROSCOPIC CHOLECYSTECTOMY;  Surgeon: Ralene Ok, MD;  Location: [place];  Service: General;  Laterality: N/A;  . DILATION AND CURETTAGE OF UTERUS     miscarriage  . LAPAROSCOPY  5/94   x 2  . SIGMOIDECTOMY     Hand-assisted laparoscopic sigmoidectomy with splenic flexure takedown.    Family History  Problem Relation Age of Onset  . Barrett's esophagus Mother   . Coronary artery disease Mother   . Congestive Heart Failure Mother   . Hypertension Mother   . Fibromyalgia Mother   . Chronic fatigue Mother   . Anemia Mother   . Irritable bowel syndrome Mother   . Atrial fibrillation Mother   . Colon polyps Father   . Diabetes Father   . Hypertension Father   . Heart disease Father   . Ehlers-Danlos syndrome Daughter   . Anemia Daughter   . Other Daughter        Postural orthostatic hypotension  . Irritable bowel syndrome Daughter   . Alcohol abuse Other   . Arthritis Other   . Hyperlipidemia Other   . Hypertension Other   . Stroke Other   . Coronary  artery disease Other   . Clotting disorder Other        Government social research officer  . Crohn's disease Cousin   . Diabetes Paternal Aunt   . Diabetes Paternal Grandfather   . Colon cancer Neg Hx     Allergies  Allergen Reactions  . Hydrocodone Nausea Only and Other (See Comments)    Hallucination   . Dilaudid [Hydromorphone Hcl]   . Latex Rash  . Penicillins Rash and Other (See Comments)    Childhood allergy    Current Outpatient Prescriptions on File Prior to Visit  Medication Sig Dispense Refill  . albuterol (PROVENTIL HFA;VENTOLIN HFA) 108 (90 Base) MCG/ACT inhaler Inhale 2 puffs into the lungs every 6 (six) hours as needed for wheezing or shortness of breath. 1 Inhaler 1  . Cholecalciferol (VITAMIN D) 2000 units CAPS Take by mouth.    . Cyanocobalamin (B-12) 1000 MCG SUBL Place 1 each under  the tongue daily.    . diclofenac sodium (VOLTAREN) 1 % GEL Apply 2 g topically 3 (three) times daily as needed. Apply upto 3 grams tid prn; use sparingly on dry skin. 100 g 3  . escitalopram (LEXAPRO) 20 MG tablet Take 1 tablet (20 mg total) by mouth daily. 90 tablet 1  . fluticasone (FLONASE) 50 MCG/ACT nasal spray SHAKE LIQUID AND USE 2 SPRAYS IN EACH NOSTRIL DAILY (Patient taking differently: SHAKE LIQUID AND USE 2 SPRAYS IN EACH NOSTRIL DAILY PRN) 16 g 3  . ibuprofen (ADVIL,MOTRIN) 800 MG tablet Take 1 tablet (800 mg total) by mouth 3 (three) times daily. (Patient taking differently: Take 800 mg by mouth 3 (three) times daily as needed. ) 21 tablet 0  . levocetirizine (XYZAL) 5 MG tablet TAKE 1 TABLET(5 MG) BY MOUTH EVERY EVENING 30 tablet 5  . levothyroxine (SYNTHROID, LEVOTHROID) 125 MCG tablet Take 1 tablet (125 mcg total) by mouth daily. 30 tablet 3  . montelukast (SINGULAIR) 10 MG tablet Take 1 tablet (10 mg total) by mouth at bedtime. 30 tablet 5  . Omega-3 Fatty Acids (FISH OIL) 1000 MG CAPS Take 2,000 mg by mouth 2 (two) times daily.    Marland Kitchen omeprazole (PRILOSEC) 20 MG capsule Take 1 capsule (20 mg total) by mouth daily. 30 capsule 3  . OVER THE COUNTER MEDICATION Take 2 capsules by mouth 2 (two) times daily. TUMERIC     No current facility-administered medications on file prior to visit.     BP 121/62 (BP Location: Right Arm, Cuff Size: Large)   Pulse (!) 56   Temp 98.7 F (37.1 C) (Oral)   Resp 18   Ht 5\' 9"  (1.753 m)   Wt 203 lb (92.1 kg)   SpO2 99%   BMI 29.98 kg/m       Objective:   Physical Exam  Constitutional: She is oriented to person, place, and time. She appears well-developed and well-nourished.  HENT:  Head: Normocephalic and atraumatic.  Cardiovascular: Normal rate, regular rhythm and normal heart sounds.   No murmur heard. Pulmonary/Chest: Effort normal and breath sounds normal. No respiratory distress. She has no wheezes.  Musculoskeletal: She exhibits no  edema.  Neurological: She is alert and oriented to person, place, and time.  Psychiatric: She has a normal mood and affect. Her behavior is normal. Judgment and thought content normal.          Assessment & Plan:  Depression- maintained on lexapro. Stable. Continue same.  Hypothyroidism-clinically stable on current dose of Synthroid continue same.  GERD-reports that  she has been unsuccessful trying to come off of the over-the-counter Prilosec. Continue current dose of PPI.  Allergic rhinitis-stable on current meds continue same.

## 2017-04-23 ENCOUNTER — Other Ambulatory Visit: Payer: Self-pay | Admitting: Family

## 2017-06-08 ENCOUNTER — Other Ambulatory Visit: Payer: Self-pay | Admitting: Family

## 2017-06-09 ENCOUNTER — Other Ambulatory Visit: Payer: Self-pay | Admitting: Family

## 2017-07-06 ENCOUNTER — Other Ambulatory Visit: Payer: Self-pay | Admitting: Family

## 2017-07-18 ENCOUNTER — Encounter: Payer: Self-pay | Admitting: Family

## 2017-07-25 ENCOUNTER — Other Ambulatory Visit: Payer: Self-pay | Admitting: Family

## 2017-09-04 ENCOUNTER — Other Ambulatory Visit: Payer: Self-pay | Admitting: Family

## 2017-09-09 DIAGNOSIS — Z01419 Encounter for gynecological examination (general) (routine) without abnormal findings: Secondary | ICD-10-CM | POA: Diagnosis not present

## 2017-09-09 DIAGNOSIS — Z6832 Body mass index (BMI) 32.0-32.9, adult: Secondary | ICD-10-CM | POA: Diagnosis not present

## 2017-09-09 DIAGNOSIS — Z1231 Encounter for screening mammogram for malignant neoplasm of breast: Secondary | ICD-10-CM | POA: Diagnosis not present

## 2017-09-09 DIAGNOSIS — L680 Hirsutism: Secondary | ICD-10-CM | POA: Diagnosis not present

## 2017-09-09 LAB — HM PAP SMEAR: HM Pap smear: NEGATIVE

## 2017-09-18 ENCOUNTER — Other Ambulatory Visit: Payer: Self-pay | Admitting: Family

## 2017-09-30 ENCOUNTER — Other Ambulatory Visit: Payer: Self-pay | Admitting: Family

## 2017-10-22 ENCOUNTER — Other Ambulatory Visit: Payer: Self-pay | Admitting: Family

## 2017-11-02 ENCOUNTER — Encounter: Payer: Self-pay | Admitting: Medical

## 2017-11-02 ENCOUNTER — Ambulatory Visit (INDEPENDENT_AMBULATORY_CARE_PROVIDER_SITE_OTHER): Payer: BLUE CROSS/BLUE SHIELD | Admitting: Medical

## 2017-11-02 VITALS — BP 113/71 | HR 104 | Temp 100.1°F | Resp 16 | Ht 69.0 in | Wt 210.2 lb

## 2017-11-02 DIAGNOSIS — R52 Pain, unspecified: Secondary | ICD-10-CM | POA: Diagnosis not present

## 2017-11-02 DIAGNOSIS — J020 Streptococcal pharyngitis: Secondary | ICD-10-CM

## 2017-11-02 DIAGNOSIS — J029 Acute pharyngitis, unspecified: Secondary | ICD-10-CM | POA: Diagnosis not present

## 2017-11-02 LAB — POC INFLUENZA A&B (BINAX/QUICKVUE)
INFLUENZA A, POC: NEGATIVE
INFLUENZA B, POC: NEGATIVE

## 2017-11-02 LAB — POCT RAPID STREP A (OFFICE): RAPID STREP A SCREEN: POSITIVE — AB

## 2017-11-02 MED ORDER — AZITHROMYCIN 250 MG PO TABS: ORAL_TABLET | ORAL | 0 refills | Status: DC

## 2017-11-02 NOTE — Progress Notes (Signed)
Subjective:    Patient ID: Christine [name], female    DOB: 08-09-1970, 47 y.o.   MRN: [id number]  HPI  Pt in with st[facility] Onset came on yesterday. Got fever as well with bodyaches. Pt daughter had strep throat last week.  Pt daughter is now better.  Pt allergic to pcn.    Review of Systems  Constitutional: Positive for fever. Negative for chills and fatigue.  HENT: Positive for sore throat. Negative for congestion, sinus pressure, sinus pain and voice change.   Respiratory: Negative for cough, chest tightness, shortness of breath and wheezing.   Cardiovascular: Negative for chest pain and palpitations.  Gastrointestinal: Negative for abdominal distention, abdominal pain, constipation, nausea and vomiting.  Musculoskeletal: Positive for myalgias. Negative for back pain and neck stiffness.  Skin: Negative for rash.  Neurological: Negative for dizziness, seizures, syncope, weakness and headaches.  Hematological: Negative for adenopathy. Does not bruise/bleed easily.  Psychiatric/Behavioral: Negative for agitation, behavioral problems, decreased concentration and dysphoric mood.    Past Medical History:  Diagnosis Date  . Allergy   . Anemia    stated h/o pernicious anemia  . Arthritis    osteoarthritis  . Asthma    Acute asthmatic bronchitis 2011 while pregnant  . Carpal tunnel syndrome    both wrist  . Depression   . Diverticulitis    recurrent  . Family history of anesthesia complication    nausea and vomiting  . Fibromyalgia   . GERD (gastroesophageal reflux disease)   . Hypothyroidism   . Migraine, unspecified, without mention of intractable migraine without mention of status migrainosus   . Miscarriage 2007  . PONV (postoperative nausea and vomiting)   . Thyroid disease      Social History   Socioeconomic History  . Marital status: Married    Spouse name: Not on file  . Number of children: 2  . Years of education: Not on file  . Highest education level:  Not on file  Occupational History  . Not on file  Social Needs  . Financial resource strain: Not on file  . Food insecurity:    Worry: Not on file    Inability: Not on file  . Transportation needs:    Medical: Not on file    Non-medical: Not on file  Tobacco Use  . Smoking status: Former Smoker    Packs/day: 0.50    Years: 3.00    Pack years: 1.50    Types: Cigarettes    Last attempt to quit: 01/31/2000    Years since quitting: 17.7  . Smokeless tobacco: Never Used  Substance and Sexual Activity  . Alcohol use: Yes    Alcohol/week: 1.2 oz    Types: 2 Glasses of wine per week    Comment: social  . Drug use: No  . Sexual activity: Yes    Birth control/protection: IUD  Lifestyle  . Physical activity:    Days per week: Not on file    Minutes per session: Not on file  . Stress: Not on file  Relationships  . Social connections:    Talks on phone: Not on file    Gets together: Not on file    Attends religious service: Not on file    Active member of club or organization: Not on file    Attends meetings of clubs or organizations: Not on file    Relationship status: Not on file  . Intimate partner violence:    Fear of current or ex  partner: Not on file    Emotionally abused: Not on file    Physically abused: Not on file    Forced sexual activity: Not on file  Other Topics Concern  . Not on file  Social History Narrative   Married, 2 daughters   Occupation: Housewife   Daily Caffeine Use   Patient does not get regular exercise.    Past Surgical History:  Procedure Laterality Date  . CHOLECYSTECTOMY N/A 03/25/2014   Procedure: LAPAROSCOPIC CHOLECYSTECTOMY;  Surgeon: Ralene Ok, MD;  Location: [place];  Service: General;  Laterality: N/A;  . DILATION AND CURETTAGE OF UTERUS     miscarriage  . LAPAROSCOPY  5/94   x 2  . SIGMOIDECTOMY     Hand-assisted laparoscopic sigmoidectomy with splenic flexure takedown.    Family History  Problem Relation Age of Onset  .  Barrett's esophagus Mother   . Coronary artery disease Mother   . Congestive Heart Failure Mother   . Hypertension Mother   . Fibromyalgia Mother   . Chronic fatigue Mother   . Anemia Mother   . Irritable bowel syndrome Mother   . Atrial fibrillation Mother   . Colon polyps Father   . Diabetes Father   . Hypertension Father   . Heart disease Father   . Ehlers-Danlos syndrome Daughter   . Anemia Daughter   . Other Daughter        Postural orthostatic hypotension  . Irritable bowel syndrome Daughter   . Alcohol abuse Other   . Arthritis Other   . Hyperlipidemia Other   . Hypertension Other   . Stroke Other   . Coronary artery disease Other   . Clotting disorder Other        Government social research officer  . Crohn's disease Cousin   . Diabetes Paternal Aunt   . Diabetes Paternal Grandfather   . Colon cancer Neg Hx     Allergies  Allergen Reactions  . Hydrocodone Nausea Only and Other (See Comments)    Hallucination   . Dilaudid [Hydromorphone Hcl]   . Latex Rash  . Penicillins Rash and Other (See Comments)    Childhood allergy    Current Outpatient Medications on File Prior to Visit  Medication Sig Dispense Refill  . albuterol (PROVENTIL HFA;VENTOLIN HFA) 108 (90 Base) MCG/ACT inhaler Inhale 2 puffs into the lungs every 6 (six) hours as needed for wheezing or shortness of breath. 1 Inhaler 1  . Cholecalciferol (VITAMIN D) 2000 units CAPS Take by mouth.    . Cyanocobalamin (B-12) 1000 MCG SUBL Place 1 each under the tongue daily.    . diclofenac sodium (VOLTAREN) 1 % GEL Apply 2 g topically 3 (three) times daily as needed. Apply upto 3 grams tid prn; use sparingly on dry skin. 100 g 3  . escitalopram (LEXAPRO) 20 MG tablet TAKE 1 TABLET BY MOUTH EVERY DAY 30 tablet 0  . fluticasone (FLONASE) 50 MCG/ACT nasal spray SHAKE LIQUID AND USE 2 SPRAYS IN EACH NOSTRIL DAILY (Patient taking differently: SHAKE LIQUID AND USE 2 SPRAYS IN EACH NOSTRIL DAILY PRN) 16 g 3  . ibuprofen  (ADVIL,MOTRIN) 800 MG tablet Take 1 tablet (800 mg total) by mouth 3 (three) times daily. (Patient taking differently: Take 800 mg by mouth 3 (three) times daily as needed. ) 21 tablet 0  . levocetirizine (XYZAL) 5 MG tablet TAKE 1 TABLET(5 MG) BY MOUTH EVERY EVENING 30 tablet 5  . levothyroxine (SYNTHROID, LEVOTHROID) 125 MCG tablet TAKE 1 TABLET BY MOUTH  EVERY DAY BEFORE BREAKFAST 14 tablet 0  . montelukast (SINGULAIR) 10 MG tablet TAKE 1 TABLET(10 MG) BY MOUTH AT BEDTIME 30 tablet 5  . Omega-3 Fatty Acids (FISH OIL) 1000 MG CAPS Take 2,000 mg by mouth 2 (two) times daily.    Marland Kitchen omeprazole (PRILOSEC) 20 MG capsule Take 1 capsule (20 mg total) by mouth daily. 30 capsule 3  . OVER THE COUNTER MEDICATION Take 2 capsules by mouth 2 (two) times daily. TUMERIC     No current facility-administered medications on file prior to visit.     BP 113/71   Pulse (!) 104   Temp 100.1 F (37.8 C) (Oral)   Resp 16   Ht 5\' 9"  (1.753 m)   Wt 210 lb 3.2 oz (95.3 kg)   SpO2 97%   BMI 31.04 kg/m       Objective:   Physical Exam  General  Mental Status - Alert. General Appearance - Well groomed. Not in acute distress.  Skin Rashes- No Rashes.  HEENT Head- Normal. Ear Auditory Canal - Left- Normal. Right - Normal.Tympanic Membrane- Left- Normal. Right- Normal. Eye Sclera/Conjunctiva- Left- Normal. Right- Normal. Nose & Sinuses Nasal Mucosa- Left-  Boggy and Congested. Right-  Boggy and  Congested.Bilateral maxillary and frontal sinus pressure. Mouth & Throat Lips: Upper Lip- Normal: no dryness, cracking, pallor, cyanosis, or vesicular eruption. Lower Lip-Normal: no dryness, cracking, pallor, cyanosis or vesicular eruption. Buccal Mucosa- Bilateral- No Aphthous ulcers. Oropharynx- No Discharge or Erythema. Tonsils: Characteristics- Bilateral- very bright Erythema. Size/Enlargement- Bilateral- 1+ enlargement. Discharge- bilateral-None.  Neck Neck- Supple. No masses.moderate tender submandibular  lymph nodes.   Chest and Lung Exam Auscultation: Breath Sounds:-Clear even and unlabored.  Cardiovascular Auscultation:Rythm- Regular, rate and rhythm. Murmurs & Other Heart Sounds:Ausculatation of the heart reveal- No Murmurs.  Lymphatic Head & Neck General Head & Neck Lymphatics: Bilateral: Description- see neck exam       Assessment & Plan:  Your strep test was positive(faint positive). I am prescribing antibiotic azithromycin . Rest hydrate, tylenol for fever and warm salt water gargles.  You should gradually get better. Update [place] if not progressive improvement or still some symptomatic day 6  Allergy hx limits choice of treatment.  Follow up in 7 days or as needed.

## 2017-11-02 NOTE — Patient Instructions (Addendum)
Your strep test was positive(faint positive). I am prescribing antibiotic azithromycin . Rest hydrate, tylenol for fever and warm salt water gargles.  You should gradually get better. Update [place] if not progressive improvement or still some symptomatic day 6  Allergy hx limits choice of treatment.  Follow up in 7 days or as needed.

## 2017-11-04 ENCOUNTER — Other Ambulatory Visit: Payer: Self-pay | Admitting: Family

## 2017-11-06 ENCOUNTER — Telehealth: Payer: Self-pay | Admitting: Family

## 2017-11-08 MED ORDER — LEVOTHYROXINE SODIUM 125 MCG PO TABS: ORAL_TABLET | ORAL | 0 refills | Status: DC

## 2017-11-08 MED ORDER — ESCITALOPRAM OXALATE 20 MG PO TABS: 20.0000 mg | ORAL_TABLET | Freq: Every day | ORAL | 0 refills | Status: DC

## 2017-11-08 NOTE — Telephone Encounter (Signed)
Called pt and scheduled and appt for 11/28/17. This was the first available appt she was able to make wile she and Lenna Sciara were both in town.

## 2017-11-08 NOTE — Telephone Encounter (Signed)
Spoke with pharmacist and cancelled previous Rx for 14 day supply and re-sent for 30 day to cover until below appt.

## 2017-11-08 NOTE — Telephone Encounter (Signed)
2 week supply of levothyroxine sent to pharmacy.  Please call pt to schedule appointment with [facility] soon. Further refills may not be given until she is seen in the office. Pt has not read previous mychart message about needing an appointment. Thanks!

## 2017-11-14 DIAGNOSIS — H10013 Acute follicular conjunctivitis, bilateral: Secondary | ICD-10-CM | POA: Diagnosis not present

## 2017-11-27 ENCOUNTER — Other Ambulatory Visit: Payer: Self-pay | Admitting: Family

## 2017-11-28 ENCOUNTER — Ambulatory Visit (INDEPENDENT_AMBULATORY_CARE_PROVIDER_SITE_OTHER): Payer: BLUE CROSS/BLUE SHIELD | Admitting: Family

## 2017-11-28 ENCOUNTER — Encounter: Payer: Self-pay | Admitting: Family

## 2017-11-28 VITALS — BP 120/54 | HR 68 | Temp 98.8°F | Resp 16 | Ht 69.0 in | Wt 212.6 lb

## 2017-11-28 DIAGNOSIS — F329 Major depressive disorder, single episode, unspecified: Secondary | ICD-10-CM | POA: Diagnosis not present

## 2017-11-28 DIAGNOSIS — F32A Depression, unspecified: Secondary | ICD-10-CM

## 2017-11-28 DIAGNOSIS — E039 Hypothyroidism, unspecified: Secondary | ICD-10-CM | POA: Diagnosis not present

## 2017-11-28 DIAGNOSIS — M797 Fibromyalgia: Secondary | ICD-10-CM | POA: Diagnosis not present

## 2017-11-28 DIAGNOSIS — J309 Allergic rhinitis, unspecified: Secondary | ICD-10-CM

## 2017-11-28 LAB — TSH: TSH: 0.6 u[IU]/mL (ref 0.35–4.50)

## 2017-11-28 MED ORDER — MONTELUKAST SODIUM 10 MG PO TABS: ORAL_TABLET | ORAL | 1 refills | Status: DC

## 2017-11-28 MED ORDER — ESCITALOPRAM OXALATE 20 MG PO TABS: 20.0000 mg | ORAL_TABLET | Freq: Every day | ORAL | 1 refills | Status: DC

## 2017-11-28 MED ORDER — LEVOCETIRIZINE DIHYDROCHLORIDE 5 MG PO TABS: ORAL_TABLET | ORAL | 1 refills | Status: DC

## 2017-11-28 MED ORDER — LEVOTHYROXINE SODIUM 125 MCG PO TABS: ORAL_TABLET | ORAL | 1 refills | Status: DC

## 2017-11-28 NOTE — Progress Notes (Signed)
Subjective:    Patient ID: Christine [name], female    DOB: Mar 20, 1971, 47 y.o.   MRN: [id number]  HPI   Patient is a 47 yr old female who presents today for follow up.  Depression- maintained on lexapro. Reports symptoms currently stable.   FM-  Was better when exercising and eating better.  Not as good right  Now.    Hypothyroid- continues synthroid.  Lab Results  Component Value Date   TSH 0.75 01/11/2017   Allergic rhinitis- uses xyzal/flonase.  Singulair has helped a lot. Especially in preventing cough/sinusitis. Continues albuterol prn.    Review of Systems See HPI  Past Medical History:  Diagnosis Date  . Allergy   . Anemia    stated h/o pernicious anemia  . Arthritis    osteoarthritis  . Asthma    Acute asthmatic bronchitis 2011 while pregnant  . Carpal tunnel syndrome    both wrist  . Depression   . Diverticulitis    recurrent  . Family history of anesthesia complication    nausea and vomiting  . Fibromyalgia   . GERD (gastroesophageal reflux disease)   . Hypothyroidism   . Migraine, unspecified, without mention of intractable migraine without mention of status migrainosus   . Miscarriage 2007  . PONV (postoperative nausea and vomiting)   . Thyroid disease      Social History   Socioeconomic History  . Marital status: Married    Spouse name: Not on file  . Number of children: 2  . Years of education: Not on file  . Highest education level: Not on file  Occupational History  . Not on file  Social Needs  . Financial resource strain: Not on file  . Food insecurity:    Worry: Not on file    Inability: Not on file  . Transportation needs:    Medical: Not on file    Non-medical: Not on file  Tobacco Use  . Smoking status: Former Smoker    Packs/day: 0.50    Years: 3.00    Pack years: 1.50    Types: Cigarettes    Last attempt to quit: 01/31/2000    Years since quitting: 17.8  . Smokeless tobacco: Never Used  Substance and Sexual Activity    . Alcohol use: Yes    Alcohol/week: 1.2 oz    Types: 2 Glasses of wine per week    Comment: social  . Drug use: No  . Sexual activity: Yes    Birth control/protection: IUD  Lifestyle  . Physical activity:    Days per week: Not on file    Minutes per session: Not on file  . Stress: Not on file  Relationships  . Social connections:    Talks on phone: Not on file    Gets together: Not on file    Attends religious service: Not on file    Active member of club or organization: Not on file    Attends meetings of clubs or organizations: Not on file    Relationship status: Not on file  . Intimate partner violence:    Fear of current or ex partner: Not on file    Emotionally abused: Not on file    Physically abused: Not on file    Forced sexual activity: Not on file  Other Topics Concern  . Not on file  Social History Narrative   Married, 2 daughters   Occupation: Housewife   Daily Caffeine Use   Patient does not  get regular exercise.    Past Surgical History:  Procedure Laterality Date  . CHOLECYSTECTOMY N/A 03/25/2014   Procedure: LAPAROSCOPIC CHOLECYSTECTOMY;  Surgeon: Ralene Ok, MD;  Location: [place];  Service: General;  Laterality: N/A;  . DILATION AND CURETTAGE OF UTERUS     miscarriage  . LAPAROSCOPY  5/94   x 2  . SIGMOIDECTOMY     Hand-assisted laparoscopic sigmoidectomy with splenic flexure takedown.    Family History  Problem Relation Age of Onset  . Barrett's esophagus Mother   . Coronary artery disease Mother   . Congestive Heart Failure Mother   . Hypertension Mother   . Fibromyalgia Mother   . Chronic fatigue Mother   . Anemia Mother   . Irritable bowel syndrome Mother   . Atrial fibrillation Mother   . Colon polyps Father   . Diabetes Father   . Hypertension Father   . Heart disease Father   . Ehlers-Danlos syndrome Daughter   . Anemia Daughter   . Other Daughter        Postural orthostatic hypotension  . Irritable bowel syndrome Daughter    . Alcohol abuse Other   . Arthritis Other   . Hyperlipidemia Other   . Hypertension Other   . Stroke Other   . Coronary artery disease Other   . Clotting disorder Other        Government social research officer  . Crohn's disease Cousin   . Diabetes Paternal Aunt   . Diabetes Paternal Grandfather   . Colon cancer Neg Hx     Allergies  Allergen Reactions  . Hydrocodone Nausea Only and Other (See Comments)    Hallucination   . Dilaudid [Hydromorphone Hcl]   . Latex Rash  . Penicillins Rash and Other (See Comments)    Childhood allergy    Current Outpatient Medications on File Prior to Visit  Medication Sig Dispense Refill  . albuterol (PROVENTIL HFA;VENTOLIN HFA) 108 (90 Base) MCG/ACT inhaler Inhale 2 puffs into the lungs every 6 (six) hours as needed for wheezing or shortness of breath. 1 Inhaler 1  . Cholecalciferol (VITAMIN D) 2000 units CAPS Take by mouth.    . Cyanocobalamin (B-12) 1000 MCG SUBL Place 1 each under the tongue daily.    . diclofenac sodium (VOLTAREN) 1 % GEL Apply 2 g topically 3 (three) times daily as needed. Apply upto 3 grams tid prn; use sparingly on dry skin. 100 g 3  . escitalopram (LEXAPRO) 20 MG tablet Take 1 tablet (20 mg total) by mouth daily. 30 tablet 0  . fluticasone (FLONASE) 50 MCG/ACT nasal spray SHAKE LIQUID AND USE 2 SPRAYS IN EACH NOSTRIL DAILY (Patient taking differently: SHAKE LIQUID AND USE 2 SPRAYS IN EACH NOSTRIL DAILY PRN) 16 g 3  . ibuprofen (ADVIL,MOTRIN) 800 MG tablet Take 1 tablet (800 mg total) by mouth 3 (three) times daily. (Patient taking differently: Take 800 mg by mouth 3 (three) times daily as needed. ) 21 tablet 0  . levocetirizine (XYZAL) 5 MG tablet TAKE 1 TABLET(5 MG) BY MOUTH EVERY EVENING 30 tablet 5  . levothyroxine (SYNTHROID, LEVOTHROID) 125 MCG tablet TAKE 1 TABLET BY MOUTH EVERY DAY BEFORE BREAKFAST 30 tablet 0  . montelukast (SINGULAIR) 10 MG tablet TAKE 1 TABLET(10 MG) BY MOUTH AT BEDTIME 30 tablet 5  . Omega-3 Fatty Acids (FISH  OIL) 1000 MG CAPS Take 2,000 mg by mouth 2 (two) times daily.    Marland Kitchen omeprazole (PRILOSEC) 20 MG capsule Take 1 capsule (20 mg total)  by mouth daily. 30 capsule 3  . OVER THE COUNTER MEDICATION Take 2 capsules by mouth 2 (two) times daily. TUMERIC     No current facility-administered medications on file prior to visit.     BP (!) 120/54 (BP Location: Left Arm, Patient Position: Sitting, Cuff Size: Large)   Pulse 68   Temp 98.8 F (37.1 C) (Oral)   Resp 16   Ht 5\' 9"  (1.753 m)   Wt 212 lb 9.6 oz (96.4 kg)   SpO2 98%   BMI 31.40 kg/m       Objective:   Physical Exam  Constitutional: She appears well-developed and well-nourished.  Cardiovascular: Normal rate, regular rhythm and normal heart sounds.  No murmur heard. Pulmonary/Chest: Effort normal and breath sounds normal. No respiratory distress. She has no wheezes.  Skin: Skin is warm and dry.  Psychiatric: She has a normal mood and affect. Her behavior is normal. Judgment and thought content normal.          Assessment & Plan:  hypothyroid-clinically stable on Synthroid obtain follow-up TSH.  fibromyalgia-she reports that her symptoms are better when she is exercising more and eating better.  She is working on these things.  Depression-continues Lexapro reports current symptoms are stable.  Allergic rhinitis-stable on Xyzal, Flonase, and Singulair.  Continue same.

## 2017-11-28 NOTE — Patient Instructions (Signed)
Please complete lab work prior to leaving.   

## 2017-12-27 ENCOUNTER — Ambulatory Visit (INDEPENDENT_AMBULATORY_CARE_PROVIDER_SITE_OTHER): Payer: BLUE CROSS/BLUE SHIELD | Admitting: Family

## 2017-12-27 ENCOUNTER — Telehealth: Payer: Self-pay | Admitting: Family

## 2017-12-27 ENCOUNTER — Encounter: Payer: Self-pay | Admitting: Family

## 2017-12-27 VITALS — BP 103/54 | HR 72 | Temp 98.8°F | Resp 16 | Ht 69.0 in | Wt 213.0 lb

## 2017-12-27 DIAGNOSIS — Z0001 Encounter for general adult medical examination with abnormal findings: Secondary | ICD-10-CM | POA: Diagnosis not present

## 2017-12-27 DIAGNOSIS — R9431 Abnormal electrocardiogram [ECG] [EKG]: Secondary | ICD-10-CM | POA: Diagnosis not present

## 2017-12-27 DIAGNOSIS — Z0000 Encounter for general adult medical examination without abnormal findings: Secondary | ICD-10-CM

## 2017-12-27 LAB — LIPID PANEL
CHOL/HDL RATIO: 6
CHOLESTEROL: 198 mg/dL (ref 0–200)
HDL: 30.9 mg/dL — AB (ref >39.00)

## 2017-12-27 LAB — CBC WITH DIFFERENTIAL/PLATELET
BASOS PCT: 0.8 % (ref 0.0–3.0)
Basophils Absolute: 0 10*3/uL (ref 0.0–0.1)
EOS ABS: 0.2 10*3/uL (ref 0.0–0.7)
Eosinophils Relative: 4.1 % (ref 0.0–5.0)
HCT: 39.8 % (ref 36.0–46.0)
HEMOGLOBIN: 13.5 g/dL (ref 12.0–15.0)
LYMPHS ABS: 1.7 10*3/uL (ref 0.7–4.0)
Lymphocytes Relative: 28.7 % (ref 12.0–46.0)
MCHC: 33.8 g/dL (ref 30.0–36.0)
MCV: 90.5 fl (ref 78.0–100.0)
MONO ABS: 0.4 10*3/uL (ref 0.1–1.0)
Monocytes Relative: 6.7 % (ref 3.0–12.0)
NEUTROS PCT: 59.7 % (ref 43.0–77.0)
Neutro Abs: 3.5 10*3/uL (ref 1.4–7.7)
PLATELETS: 266 10*3/uL (ref 150.0–400.0)
RBC: 4.4 Mil/uL (ref 3.87–5.11)
RDW: 13.4 % (ref 11.5–15.5)
WBC: 5.8 10*3/uL (ref 4.0–10.5)

## 2017-12-27 LAB — HEPATIC FUNCTION PANEL
ALT: 18 U/L (ref 0–35)
AST: 15 U/L (ref 0–37)
Albumin: 4.3 g/dL (ref 3.5–5.2)
Alkaline Phosphatase: 58 U/L (ref 39–117)
BILIRUBIN DIRECT: 0 mg/dL (ref 0.0–0.3)
BILIRUBIN TOTAL: 0.3 mg/dL (ref 0.2–1.2)
Total Protein: 7.6 g/dL (ref 6.0–8.3)

## 2017-12-27 LAB — URINALYSIS, ROUTINE W REFLEX MICROSCOPIC
Bilirubin Urine: NEGATIVE
Hgb urine dipstick: NEGATIVE
KETONES UR: NEGATIVE
Leukocytes, UA: NEGATIVE
Nitrite: NEGATIVE
PH: 8 (ref 5.0–8.0)
RBC / HPF: NONE SEEN (ref 0–?)
SPECIFIC GRAVITY, URINE: 1.015 (ref 1.000–1.030)
Total Protein, Urine: NEGATIVE
URINE GLUCOSE: NEGATIVE
UROBILINOGEN UA: 0.2 (ref 0.0–1.0)
WBC, UA: NONE SEEN (ref 0–?)

## 2017-12-27 LAB — LDL CHOLESTEROL, DIRECT: Direct LDL: 117 mg/dL

## 2017-12-27 LAB — BASIC METABOLIC PANEL
BUN: 10 mg/dL (ref 6–23)
CALCIUM: 9.5 mg/dL (ref 8.4–10.5)
CO2: 30 mEq/L (ref 19–32)
Chloride: 101 mEq/L (ref 96–112)
Creatinine, Ser: 0.66 mg/dL (ref 0.40–1.20)
GFR: 102.5 mL/min (ref >60.00)
Glucose, Bld: 91 mg/dL (ref 70–99)
Potassium: 4.4 mEq/L (ref 3.5–5.1)
SODIUM: 137 meq/L (ref 135–145)

## 2017-12-27 NOTE — Telephone Encounter (Signed)
Please see mychart

## 2017-12-27 NOTE — Patient Instructions (Signed)
Please complete lab work prior to leaving. Continue to work on healthy diet, exercise and weight loss.  

## 2017-12-27 NOTE — Progress Notes (Signed)
Subjective:    Patient ID: Christine [name], female    DOB: August 20, 1970, 47 y.o.   MRN: [id number]  HPI  Patient presents today for complete physical.  Immunizations: tetanus 2016 Diet: reports that her diet is improved.  Exercise: trying to get back into exercise.  Pap Smear:  09/05/17 Mammogram:  Completed earlier this year per [facility] for women Vision: 3/19 Dental:  4/19     Review of Systems  Constitutional: Negative for unexpected weight change.  HENT: Negative for hearing loss.        Resolving "cold" symptoms  Eyes: Negative for visual disturbance.  Respiratory: Positive for cough.   Cardiovascular: Negative for palpitations and leg swelling.  Gastrointestinal: Negative for blood in stool, constipation and diarrhea.  Genitourinary: Negative for dysuria, frequency and hematuria.  Musculoskeletal: Positive for myalgias. Negative for arthralgias.  Skin: Negative for rash.  Neurological:       Rare headaches  Hematological: Negative for adenopathy.  Psychiatric/Behavioral:       Denies depression/anxiety   Past Medical History:  Diagnosis Date  . Allergy   . Anemia    stated h/o pernicious anemia  . Arthritis    osteoarthritis  . Asthma    Acute asthmatic bronchitis 2011 while pregnant  . Carpal tunnel syndrome    both wrist  . Depression   . Diverticulitis    recurrent  . Family history of anesthesia complication    nausea and vomiting  . Fibromyalgia   . GERD (gastroesophageal reflux disease)   . Hypothyroidism   . Migraine, unspecified, without mention of intractable migraine without mention of status migrainosus   . Miscarriage 2007  . PONV (postoperative nausea and vomiting)   . Thyroid disease      Social History   Socioeconomic History  . Marital status: Married    Spouse name: Not on file  . Number of children: 2  . Years of education: Not on file  . Highest education level: Not on file  Occupational History  . Not on file  Social  Needs  . Financial resource strain: Not on file  . Food insecurity:    Worry: Not on file    Inability: Not on file  . Transportation needs:    Medical: Not on file    Non-medical: Not on file  Tobacco Use  . Smoking status: Former Smoker    Packs/day: 0.50    Years: 3.00    Pack years: 1.50    Types: Cigarettes    Last attempt to quit: 01/31/2000    Years since quitting: 17.9  . Smokeless tobacco: Never Used  Substance and Sexual Activity  . Alcohol use: Yes    Alcohol/week: 1.2 oz    Types: 2 Glasses of wine per week    Comment: social  . Drug use: No  . Sexual activity: Yes    Birth control/protection: IUD  Lifestyle  . Physical activity:    Days per week: Not on file    Minutes per session: Not on file  . Stress: Not on file  Relationships  . Social connections:    Talks on phone: Not on file    Gets together: Not on file    Attends religious service: Not on file    Active member of club or organization: Not on file    Attends meetings of clubs or organizations: Not on file    Relationship status: Not on file  . Intimate partner violence:    Fear  of current or ex partner: Not on file    Emotionally abused: Not on file    Physically abused: Not on file    Forced sexual activity: Not on file  Other Topics Concern  . Not on file  Social History Narrative   Married, 2 daughters   Occupation: Housewife   Daily Caffeine Use   Patient does not get regular exercise.    Past Surgical History:  Procedure Laterality Date  . CHOLECYSTECTOMY N/A 03/25/2014   Procedure: LAPAROSCOPIC CHOLECYSTECTOMY;  Surgeon: Ralene Ok, MD;  Location: [facility];  Service: General;  Laterality: N/A;  . DILATION AND CURETTAGE OF UTERUS     miscarriage  . LAPAROSCOPY  5/94   x 2  . SIGMOIDECTOMY     Hand-assisted laparoscopic sigmoidectomy with splenic flexure takedown.    Family History  Problem Relation Age of Onset  . Barrett's esophagus Mother   . Coronary artery disease  Mother   . Congestive Heart Failure Mother   . Hypertension Mother   . Fibromyalgia Mother   . Chronic fatigue Mother   . Anemia Mother   . Irritable bowel syndrome Mother   . Atrial fibrillation Mother   . Colon polyps Father   . Diabetes Father   . Hypertension Father   . Heart disease Father   . Ehlers-Danlos syndrome Daughter   . Anemia Daughter   . Other Daughter        Postural orthostatic hypotension  . Irritable bowel syndrome Daughter   . Alcohol abuse Other   . Arthritis Other   . Hyperlipidemia Other   . Hypertension Other   . Stroke Other   . Coronary artery disease Other   . Clotting disorder Other        Government social research officer  . Crohn's disease Cousin   . Diabetes Paternal Aunt   . Diabetes Paternal Grandfather   . Colon cancer Neg Hx     Allergies  Allergen Reactions  . Hydrocodone Nausea Only and Other (See Comments)    Hallucination   . Dilaudid [Hydromorphone Hcl]   . Latex Rash  . Penicillins Rash and Other (See Comments)    Childhood allergy    Current Outpatient Medications on File Prior to Visit  Medication Sig Dispense Refill  . albuterol (PROVENTIL HFA;VENTOLIN HFA) 108 (90 Base) MCG/ACT inhaler Inhale 2 puffs into the lungs every 6 (six) hours as needed for wheezing or shortness of breath. 1 Inhaler 1  . Cholecalciferol (VITAMIN D) 2000 units CAPS Take by mouth.    . Cyanocobalamin (B-12) 1000 MCG SUBL Place 1 each under the tongue daily.    . diclofenac sodium (VOLTAREN) 1 % GEL Apply 2 g topically 3 (three) times daily as needed. Apply upto 3 grams tid prn; use sparingly on dry skin. 100 g 3  . escitalopram (LEXAPRO) 20 MG tablet Take 1 tablet (20 mg total) by mouth daily. 90 tablet 1  . fluticasone (FLONASE) 50 MCG/ACT nasal spray SHAKE LIQUID AND USE 2 SPRAYS IN EACH NOSTRIL DAILY (Patient taking differently: SHAKE LIQUID AND USE 2 SPRAYS IN EACH NOSTRIL DAILY PRN) 16 g 3  . ibuprofen (ADVIL,MOTRIN) 800 MG tablet Take 1 tablet (800 mg total)  by mouth 3 (three) times daily. (Patient taking differently: Take 800 mg by mouth 3 (three) times daily as needed. ) 21 tablet 0  . levocetirizine (XYZAL) 5 MG tablet TAKE 1 TABLET(5 MG) BY MOUTH EVERY EVENING 90 tablet 1  . levothyroxine (SYNTHROID, LEVOTHROID) 125 MCG  tablet TAKE 1 TABLET BY MOUTH EVERY DAY BEFORE BREAKFAST 90 tablet 1  . montelukast (SINGULAIR) 10 MG tablet TAKE 1 TABLET(10 MG) BY MOUTH AT BEDTIME 90 tablet 1  . Omega-3 Fatty Acids (FISH OIL) 1000 MG CAPS Take 2,000 mg by mouth 2 (two) times daily.    Marland Kitchen omeprazole (PRILOSEC) 20 MG capsule Take 1 capsule (20 mg total) by mouth daily. 30 capsule 3  . OVER THE COUNTER MEDICATION Take 2 capsules by mouth 2 (two) times daily. TUMERIC     No current facility-administered medications on file prior to visit.     BP (!) 103/54 (BP Location: Right Arm, Patient Position: Sitting, Cuff Size: Large)   Pulse 72   Temp 98.8 F (37.1 C) (Oral)   Resp 16   Ht 5\' 9"  (1.753 m)   Wt 213 lb (96.6 kg)   SpO2 99%   BMI 31.45 kg/m       Objective:   Physical Exam  Physical Exam  Constitutional: She is oriented to person, place, and time. She appears well-developed and well-nourished. No distress.  HENT:  Head: Normocephalic and atraumatic.  Right Ear: Tympanic membrane and ear canal normal.  Left Ear: Tympanic membrane and ear canal normal.  Mouth/Throat: Oropharynx is clear and moist.  Eyes: Pupils are equal, round, and reactive to light. No scleral icterus.  Neck: Normal range of motion. No thyromegaly present.  Cardiovascular: Normal rate and regular rhythm.   No murmur heard. Pulmonary/Chest: Effort normal and breath sounds normal. No respiratory distress. He has no wheezes. She has no rales. She exhibits no tenderness.  Abdominal: Soft. Bowel sounds are normal. She exhibits no distension and no mass. There is no tenderness. There is no rebound and no guarding.  Musculoskeletal: She exhibits no edema.  Lymphadenopathy:     She has no cervical adenopathy.  Neurological: She is alert and oriented to person, place, and time. She has normal patellar reflexes. She exhibits normal muscle tone. Coordination normal.  Skin: Skin is warm and dry.  Psychiatric: She has a normal mood and affect. Her behavior is normal. Judgment and thought content normal.  Breasts: Examined lying Right: Without masses, retractions, discharge or axillary adenopathy.  Left: Without masses, retractions, discharge or axillary adenopathy.  Pelvic: deferred         Assessment & Plan:    Preventative care- discussed healthy diet, exercise and weight loss. Immunizations reviewed and up to date. Pap/mammo up to date.  EKG, notes Short PR. Pt asymptomatic. Reviewed with Dr. Charlett Blake. Will plan to repeat EKG next visit.     Assessment & Plan:

## 2017-12-28 ENCOUNTER — Other Ambulatory Visit: Payer: Self-pay

## 2017-12-28 ENCOUNTER — Telehealth: Payer: Self-pay | Admitting: Family

## 2017-12-28 DIAGNOSIS — E7800 Pure hypercholesterolemia, unspecified: Secondary | ICD-10-CM

## 2017-12-28 MED ORDER — FENOFIBRATE 145 MG PO TABS: 145.0000 mg | ORAL_TABLET | Freq: Every day | ORAL | 5 refills | Status: DC

## 2017-12-28 NOTE — Telephone Encounter (Signed)
Please contact pt and let her know that her triglycerides are very elevated.  I would like her to start fenofibrate once daily, repeat FLP in 3 months, dx hypertriglyceridemia.  Work on diet: avoiding concentrated sweets, and limiting white carbs (rice/bread/pasta/potatoes). Instead substitute whole grain versions with reasonable portions.

## 2017-12-28 NOTE — Telephone Encounter (Signed)
Results and provider's comments given to patient, she understands and will start Fenofibrate as well as low sugar and carb diet. Order entered for FLP in 3 months.

## 2018-01-20 DIAGNOSIS — M7662 Achilles tendinitis, left leg: Secondary | ICD-10-CM | POA: Diagnosis not present

## 2018-01-20 DIAGNOSIS — M722 Plantar fascial fibromatosis: Secondary | ICD-10-CM | POA: Diagnosis not present

## 2018-03-01 DIAGNOSIS — Z30433 Encounter for removal and reinsertion of intrauterine contraceptive device: Secondary | ICD-10-CM | POA: Diagnosis not present

## 2018-05-20 ENCOUNTER — Other Ambulatory Visit: Payer: Self-pay | Admitting: Family

## 2018-06-19 ENCOUNTER — Other Ambulatory Visit: Payer: Self-pay | Admitting: Family

## 2018-07-04 ENCOUNTER — Ambulatory Visit (INDEPENDENT_AMBULATORY_CARE_PROVIDER_SITE_OTHER): Payer: BLUE CROSS/BLUE SHIELD | Admitting: Family

## 2018-07-04 ENCOUNTER — Encounter: Payer: Self-pay | Admitting: Family

## 2018-07-04 VITALS — BP 128/60 | HR 63 | Temp 98.3°F | Resp 16 | Ht 69.0 in | Wt 225.0 lb

## 2018-07-04 DIAGNOSIS — E039 Hypothyroidism, unspecified: Secondary | ICD-10-CM

## 2018-07-04 DIAGNOSIS — F32A Depression, unspecified: Secondary | ICD-10-CM

## 2018-07-04 DIAGNOSIS — Z23 Encounter for immunization: Secondary | ICD-10-CM | POA: Diagnosis not present

## 2018-07-04 DIAGNOSIS — M797 Fibromyalgia: Secondary | ICD-10-CM

## 2018-07-04 DIAGNOSIS — G894 Chronic pain syndrome: Secondary | ICD-10-CM

## 2018-07-04 DIAGNOSIS — E785 Hyperlipidemia, unspecified: Secondary | ICD-10-CM

## 2018-07-04 DIAGNOSIS — E559 Vitamin D deficiency, unspecified: Secondary | ICD-10-CM

## 2018-07-04 DIAGNOSIS — F329 Major depressive disorder, single episode, unspecified: Secondary | ICD-10-CM

## 2018-07-04 DIAGNOSIS — J45909 Unspecified asthma, uncomplicated: Secondary | ICD-10-CM

## 2018-07-04 DIAGNOSIS — J029 Acute pharyngitis, unspecified: Secondary | ICD-10-CM

## 2018-07-04 DIAGNOSIS — E538 Deficiency of other specified B group vitamins: Secondary | ICD-10-CM

## 2018-07-04 LAB — LIPID PANEL
CHOL/HDL RATIO: 5
Cholesterol: 182 mg/dL (ref 0–200)
HDL: 37.2 mg/dL — ABNORMAL LOW (ref >39.00)
NonHDL: 144.94
Triglycerides: 204 mg/dL — ABNORMAL HIGH (ref 0.0–149.0)
VLDL: 40.8 mg/dL — ABNORMAL HIGH (ref 0.0–40.0)

## 2018-07-04 LAB — LDL CHOLESTEROL, DIRECT: Direct LDL: 126 mg/dL

## 2018-07-04 LAB — VITAMIN B12: Vitamin B-12: 199 pg/mL — ABNORMAL LOW (ref 211–911)

## 2018-07-04 LAB — TSH: TSH: 1.41 u[IU]/mL (ref 0.35–4.50)

## 2018-07-04 MED ORDER — ESCITALOPRAM OXALATE 10 MG PO TABS: 10.0000 mg | ORAL_TABLET | Freq: Every day | ORAL | 0 refills | Status: DC

## 2018-07-04 MED ORDER — ALBUTEROL SULFATE HFA 108 (90 BASE) MCG/ACT IN AERS: 2.0000 | INHALATION_SPRAY | Freq: Four times a day (QID) | RESPIRATORY_TRACT | 3 refills | Status: DC | PRN

## 2018-07-04 MED ORDER — MONTELUKAST SODIUM 10 MG PO TABS: 10.0000 mg | ORAL_TABLET | Freq: Every day | ORAL | 1 refills | Status: DC

## 2018-07-04 MED ORDER — DULOXETINE HCL 30 MG PO CPEP: 30.0000 mg | ORAL_CAPSULE | Freq: Every day | ORAL | 0 refills | Status: DC

## 2018-07-04 NOTE — Progress Notes (Signed)
Subjective:    Patient ID: Christine [name], female    DOB: 07-09-1971, 47 y.o.   MRN: [id number]  HPI  Patient is a 47 year old female who presents today for follow-up.  Hypothyroidism- current Synthroid dosing is 125 mcg once daily. Lab Results  Component Value Date   TSH 0.60 11/28/2017   Depression-she is maintained on Lexapro 20 mg once daily. Reports that she has lack of emotion.  Feels "numb."  Reports mood is good however.   Asthma/allergic rhinitis-maintained on Singulair and as needed Flonase.  She also takes Xyzal.  She reports that Singulair helps a lot with her asthma symptoms.  She is rarely needing to use her albuterol inhaler.  Hyperlipidemia- she reports that she has not been taking fenofibrate. Lab Results  Component Value Date   CHOL 198 12/27/2017   HDL 30.90 (L) 12/27/2017   LDLCALC 113 (H) 06/04/2016   LDLDIRECT 117.0 12/27/2017   TRIG (H) 12/27/2017    536.0 Triglyceride is over 400; calculations on Lipids are invalid.   CHOLHDL 6 12/27/2017   Chronic pain- HAs, neck, shoulder pain.  Saw Dr. Estanislado Pandy in the past. Reports she was told pain was due to [facility] not autoimmune process.   Review of Systems See HPI  Past Medical History:  Diagnosis Date  . Allergy   . Anemia    stated h/o pernicious anemia  . Arthritis    osteoarthritis  . Asthma    Acute asthmatic bronchitis 2011 while pregnant  . Carpal tunnel syndrome    both wrist  . Depression   . Diverticulitis    recurrent  . Family history of anesthesia complication    nausea and vomiting  . Fibromyalgia   . GERD (gastroesophageal reflux disease)   . Hypothyroidism   . Migraine, unspecified, without mention of intractable migraine without mention of status migrainosus   . Miscarriage 2007  . PONV (postoperative nausea and vomiting)   . Thyroid disease      Social History   Socioeconomic History  . Marital status: Married    Spouse name: Not on file  . Number of children: 2  .  Years of education: Not on file  . Highest education level: Not on file  Occupational History  . Not on file  Social Needs  . Financial resource strain: Not on file  . Food insecurity:    Worry: Not on file    Inability: Not on file  . Transportation needs:    Medical: Not on file    Non-medical: Not on file  Tobacco Use  . Smoking status: Former Smoker    Packs/day: 0.50    Years: 3.00    Pack years: 1.50    Types: Cigarettes    Last attempt to quit: 01/31/2000    Years since quitting: 18.4  . Smokeless tobacco: Never Used  Substance and Sexual Activity  . Alcohol use: Yes    Alcohol/week: 2.0 standard drinks    Types: 2 Glasses of wine per week    Comment: social  . Drug use: No  . Sexual activity: Yes    Birth control/protection: IUD  Lifestyle  . Physical activity:    Days per week: Not on file    Minutes per session: Not on file  . Stress: Not on file  Relationships  . Social connections:    Talks on phone: Not on file    Gets together: Not on file    Attends religious service: Not on file  Active member of club or organization: Not on file    Attends meetings of clubs or organizations: Not on file    Relationship status: Not on file  . Intimate partner violence:    Fear of current or ex partner: Not on file    Emotionally abused: Not on file    Physically abused: Not on file    Forced sexual activity: Not on file  Other Topics Concern  . Not on file  Social History Narrative   Married, 2 daughters (38 and 79) son age 90   Occupation: Housewife   Daily Caffeine Use   Patient does not get regular exercise.    Past Surgical History:  Procedure Laterality Date  . CHOLECYSTECTOMY N/A 03/25/2014   Procedure: LAPAROSCOPIC CHOLECYSTECTOMY;  Surgeon: Ralene Ok, MD;  Location: [facility] [facility];  Service: General;  Laterality: N/A;  . DILATION AND CURETTAGE OF UTERUS     miscarriage  . LAPAROSCOPY  5/94   x 2  . SIGMOIDECTOMY     Hand-assisted laparoscopic  sigmoidectomy with splenic flexure takedown.    Family History  Problem Relation Age of Onset  . Barrett's esophagus Mother   . Coronary artery disease Mother   . Congestive Heart Failure Mother   . Hypertension Mother   . Fibromyalgia Mother   . Chronic fatigue Mother   . Anemia Mother   . Irritable bowel syndrome Mother   . Atrial fibrillation Mother   . Colon polyps Father   . Diabetes Father   . Hypertension Father   . Heart disease Father   . Ehlers-Danlos syndrome Daughter   . Anemia Daughter   . Other Daughter        Postural orthostatic hypotension  . Irritable bowel syndrome Daughter   . Alcohol abuse Other   . Arthritis Other   . Hyperlipidemia Other   . Hypertension Other   . Stroke Other   . Coronary artery disease Other   . Clotting disorder Other        Government social research officer  . Crohn's disease Cousin   . Diabetes Paternal Aunt   . Diabetes Paternal Grandfather   . Colon cancer Neg Hx     Allergies  Allergen Reactions  . Hydrocodone Nausea Only and Other (See Comments)    Hallucination   . Dilaudid [Hydromorphone Hcl]   . Latex Rash  . Penicillins Rash and Other (See Comments)    Childhood allergy    Current Outpatient Medications on File Prior to Visit  Medication Sig Dispense Refill  . albuterol (PROVENTIL HFA;VENTOLIN HFA) 108 (90 Base) MCG/ACT inhaler Inhale 2 puffs into the lungs every 6 (six) hours as needed for wheezing or shortness of breath. 1 Inhaler 1  . diclofenac sodium (VOLTAREN) 1 % GEL Apply 2 g topically 3 (three) times daily as needed. Apply upto 3 grams tid prn; use sparingly on dry skin. 100 g 3  . escitalopram (LEXAPRO) 20 MG tablet TAKE 1 TABLET(20 MG) BY MOUTH DAILY 90 tablet 0  . fluticasone (FLONASE) 50 MCG/ACT nasal spray SHAKE LIQUID AND USE 2 SPRAYS IN EACH NOSTRIL DAILY (Patient taking differently: SHAKE LIQUID AND USE 2 SPRAYS IN EACH NOSTRIL DAILY PRN) 16 g 3  . ibuprofen (ADVIL,MOTRIN) 800 MG tablet Take 1 tablet (800 mg  total) by mouth 3 (three) times daily. (Patient taking differently: Take 800 mg by mouth 3 (three) times daily as needed. ) 21 tablet 0  . levocetirizine (XYZAL) 5 MG tablet TAKE 1 TABLET(5 MG) BY  MOUTH EVERY EVENING 90 tablet 1  . levothyroxine (SYNTHROID, LEVOTHROID) 125 MCG tablet TAKE 1 TABLET BY MOUTH EVERY DAY BEFORE BREAKFAST 90 tablet 0  . montelukast (SINGULAIR) 10 MG tablet TAKE 1 TABLET(10 MG) BY MOUTH AT BEDTIME 90 tablet 0  . omeprazole (PRILOSEC) 20 MG capsule Take 1 capsule (20 mg total) by mouth daily. 30 capsule 3  . OVER THE COUNTER MEDICATION Take 2 capsules by mouth 2 (two) times daily. TUMERIC    . fenofibrate (TRICOR) 145 MG tablet Take 1 tablet (145 mg total) by mouth daily. (Patient not taking: Reported on 07/04/2018) 30 tablet 5   No current facility-administered medications on file prior to visit.     BP 128/60 (BP Location: Right Arm, Patient Position: Sitting, Cuff Size: Large)   Pulse 63   Temp 98.3 F (36.8 C) (Oral)   Resp 16   Ht 5\' 9"  (1.753 m)   Wt 225 lb (102.1 kg)   SpO2 98%   BMI 33.23 kg/m       Objective:   Physical Exam  Constitutional: She is oriented to person, place, and time. She appears well-developed and well-nourished.  Neck: Neck supple. No thyromegaly present.  Cardiovascular: Normal rate, regular rhythm and normal heart sounds.  No murmur heard. Pulmonary/Chest: Effort normal and breath sounds normal. No respiratory distress. She has no wheezes.  Neurological: She is alert and oriented to person, place, and time.  Skin: Skin is warm and dry.  Psychiatric: She has a normal mood and affect. Her behavior is normal. Judgment and thought content normal.          Assessment & Plan:  Depression- stable.  Will decrease Lexapro to 10 mg once daily to see if this helps with some of her complaint of lack of emotion.  Chronic pain/fibromyalgia-uncontrolled.  We will give her a trial of low-dose Cymbalta 30 mg once daily.  Could consider  eventually transitioning off of Lexapro and up on Cymbalta next visit.  I also gave her a handwritten prescription for massage therapy as I think this may help with some of her neck and shoulder tension.  She is requesting B12 and vitamin D levels today due to fatigue.  Asthma- stable on current medications.  Continue same.  Hyperlipidemia-  Will check follow-up lipid panel.  Hypothyroid- clinically stable.  Obtain follow-up TSH.  Continue Synthroid.

## 2018-07-04 NOTE — Patient Instructions (Signed)
Please complete lab work prior to leaving. Decrease lexapro from 20mg  to 10mg .  Add cymbalta 30 mg once daily.

## 2018-07-07 LAB — VITAMIN D 1,25 DIHYDROXY
Vitamin D 1, 25 (OH)2 Total: 48 pg/mL (ref 18–72)
Vitamin D3 1, 25 (OH)2: 48 pg/mL

## 2018-07-08 ENCOUNTER — Telehealth: Payer: Self-pay | Admitting: Family

## 2018-07-08 DIAGNOSIS — E538 Deficiency of other specified B group vitamins: Secondary | ICD-10-CM | POA: Insufficient documentation

## 2018-07-08 NOTE — Telephone Encounter (Signed)
b12 is low. Recommend that she start b12 shots 1043mcg IM weekly x 4 weeks, then monthly.  Triglycerides are normal and vit d is normal.

## 2018-07-11 ENCOUNTER — Other Ambulatory Visit: Payer: Self-pay

## 2018-07-11 ENCOUNTER — Ambulatory Visit (INDEPENDENT_AMBULATORY_CARE_PROVIDER_SITE_OTHER): Payer: BLUE CROSS/BLUE SHIELD

## 2018-07-11 DIAGNOSIS — E538 Deficiency of other specified B group vitamins: Secondary | ICD-10-CM

## 2018-07-11 MED ORDER — CYANOCOBALAMIN 1000 MCG/ML IJ SOLN
1000.0000 ug | Freq: Once | INTRAMUSCULAR | Status: AC
  Administered 2018-07-11: 1000 ug via INTRAMUSCULAR

## 2018-07-11 NOTE — Progress Notes (Signed)
Pre visit review using our clinic tool,if applicable. No additional management support is needed unless otherwise documented below in the visit note.   Pt here for monthly B12 injection per order from M. Osullivan,NP  B12 1020mcg given IM left deltoid., and pt tolerated injection well. No complaints voiced.  Next B12 injection scheduled for 1 month.

## 2018-07-12 NOTE — Telephone Encounter (Signed)
Talked to patient yesterday morning, came for first b12 yesterday afternoon.

## 2018-07-18 ENCOUNTER — Ambulatory Visit (INDEPENDENT_AMBULATORY_CARE_PROVIDER_SITE_OTHER): Payer: BLUE CROSS/BLUE SHIELD

## 2018-07-18 DIAGNOSIS — E538 Deficiency of other specified B group vitamins: Secondary | ICD-10-CM | POA: Diagnosis not present

## 2018-07-18 MED ORDER — CYANOCOBALAMIN 1000 MCG/ML IJ SOLN
1000.0000 ug | Freq: Once | INTRAMUSCULAR | Status: AC
  Administered 2018-07-18: 1000 ug via INTRAMUSCULAR

## 2018-07-18 NOTE — Progress Notes (Signed)
Pre visit review using our clinic tool,if applicable. No additional management support is needed unless otherwise documented below in the visit note.   Pt here for monthly B12 injection per order from M. O'Sullivan,NP   B12 1026mcg given IM left deltoid., and pt tolerated injection well.  Next B12 injection scheduled for 1 month. Patient aware.

## 2018-07-18 NOTE — Addendum Note (Signed)
Addended by: Bunnie Domino on: 07/18/2018 10:59 AM   Modules accepted: Orders

## 2018-07-25 ENCOUNTER — Ambulatory Visit (INDEPENDENT_AMBULATORY_CARE_PROVIDER_SITE_OTHER): Payer: BLUE CROSS/BLUE SHIELD

## 2018-07-25 DIAGNOSIS — E538 Deficiency of other specified B group vitamins: Secondary | ICD-10-CM

## 2018-07-25 MED ORDER — CYANOCOBALAMIN 1000 MCG/ML IJ SOLN
1000.0000 ug | Freq: Once | INTRAMUSCULAR | Status: AC
  Administered 2018-07-25: 1000 ug via INTRAMUSCULAR

## 2018-07-25 NOTE — Progress Notes (Addendum)
Pre visit review using our clinic tool,if applicable. No additional management support is needed unless otherwise documented below in the visit note.   Pt here for monthly B12 injection per order from Debbrah Alar, NP.  B12 1053mcg given IM left deltoid, and pt tolerated injection well.  Next B12 injection scheduled for 1 month. Patient to call back to schedule. Kathlene November, MD

## 2018-08-01 ENCOUNTER — Ambulatory Visit: Payer: Self-pay

## 2018-08-03 ENCOUNTER — Ambulatory Visit (INDEPENDENT_AMBULATORY_CARE_PROVIDER_SITE_OTHER): Payer: BLUE CROSS/BLUE SHIELD

## 2018-08-03 DIAGNOSIS — E538 Deficiency of other specified B group vitamins: Secondary | ICD-10-CM

## 2018-08-03 MED ORDER — CYANOCOBALAMIN 1000 MCG/ML IJ SOLN
1000.0000 ug | Freq: Once | INTRAMUSCULAR | Status: AC
  Administered 2018-08-03: 1000 ug via INTRAMUSCULAR

## 2018-08-03 NOTE — Progress Notes (Signed)
Pre visit review using our clinic tool,if applicable. No additional management support is needed unless otherwise documented below in the visit note.   Pt here for monthly B12 injection per order from [facility].  B12 1062mcg given IM left deltoid, and pt tolerated injection well.  Next B12 injection scheduled for 1 month.

## 2018-08-08 ENCOUNTER — Ambulatory Visit (INDEPENDENT_AMBULATORY_CARE_PROVIDER_SITE_OTHER): Payer: BLUE CROSS/BLUE SHIELD | Admitting: Family

## 2018-08-08 ENCOUNTER — Encounter: Payer: Self-pay | Admitting: Family

## 2018-08-08 VITALS — BP 116/63 | HR 73 | Temp 99.0°F | Resp 16 | Ht 69.0 in | Wt 225.0 lb

## 2018-08-08 DIAGNOSIS — F329 Major depressive disorder, single episode, unspecified: Secondary | ICD-10-CM | POA: Diagnosis not present

## 2018-08-08 DIAGNOSIS — M797 Fibromyalgia: Secondary | ICD-10-CM | POA: Diagnosis not present

## 2018-08-08 DIAGNOSIS — F32A Depression, unspecified: Secondary | ICD-10-CM

## 2018-08-08 DIAGNOSIS — E538 Deficiency of other specified B group vitamins: Secondary | ICD-10-CM

## 2018-08-08 MED ORDER — ESCITALOPRAM OXALATE 5 MG PO TABS: 5.0000 mg | ORAL_TABLET | Freq: Every day | ORAL | 3 refills | Status: DC

## 2018-08-08 MED ORDER — DULOXETINE HCL 60 MG PO CPEP: 60.0000 mg | ORAL_CAPSULE | Freq: Every day | ORAL | 3 refills | Status: DC

## 2018-08-08 NOTE — Patient Instructions (Signed)
Decrease lexapro from 10mg  to 5mg . Increase cymbalta from 30 mg to 60mg .

## 2018-08-08 NOTE — Progress Notes (Signed)
Subjective:    Patient ID: Christine [name], female    DOB: July 06, 1971, 48 y.o.   MRN: [id number]  HPI  Patient is a 48 yr old female who presents today for follow up of her depression.  Last visit she described feeling numb and her FM pain was uncontrolled. We decreased her lexapro and placed her on a low dose of cymbalta.  She reports mood is "ok."  Still feels a lack of emotion.    B12 level was noted to be low last visit. We initiated b12 injections.  She has completed 4 weekly injections and now will be moving to monthly injections.    FM- reports some improvement in her FM pain since starting cymbalta.    Review of Systems See HPI  Past Medical History:  Diagnosis Date  . Allergy   . Anemia    stated h/o pernicious anemia  . Arthritis    osteoarthritis  . Asthma    Acute asthmatic bronchitis 2011 while pregnant  . Carpal tunnel syndrome    both wrist  . Depression   . Diverticulitis    recurrent  . Family history of anesthesia complication    nausea and vomiting  . Fibromyalgia   . GERD (gastroesophageal reflux disease)   . Hypothyroidism   . Migraine, unspecified, without mention of intractable migraine without mention of status migrainosus   . Miscarriage 2007  . PONV (postoperative nausea and vomiting)   . Thyroid disease      Social History   Socioeconomic History  . Marital status: Married    Spouse name: Not on file  . Number of children: 2  . Years of education: Not on file  . Highest education level: Not on file  Occupational History  . Not on file  Social Needs  . Financial resource strain: Not on file  . Food insecurity:    Worry: Not on file    Inability: Not on file  . Transportation needs:    Medical: Not on file    Non-medical: Not on file  Tobacco Use  . Smoking status: Former Smoker    Packs/day: 0.50    Years: 3.00    Pack years: 1.50    Types: Cigarettes    Last attempt to quit: 01/31/2000    Years since quitting: 18.5  .  Smokeless tobacco: Never Used  Substance and Sexual Activity  . Alcohol use: Yes    Alcohol/week: 2.0 standard drinks    Types: 2 Glasses of wine per week    Comment: social  . Drug use: No  . Sexual activity: Yes    Birth control/protection: I.U.D.  Lifestyle  . Physical activity:    Days per week: Not on file    Minutes per session: Not on file  . Stress: Not on file  Relationships  . Social connections:    Talks on phone: Not on file    Gets together: Not on file    Attends religious service: Not on file    Active member of club or organization: Not on file    Attends meetings of clubs or organizations: Not on file    Relationship status: Not on file  . Intimate partner violence:    Fear of current or ex partner: Not on file    Emotionally abused: Not on file    Physically abused: Not on file    Forced sexual activity: Not on file  Other Topics Concern  . Not on file  Social History Narrative   Married, 2 daughters (1 and 65) son age 47   Occupation: Housewife   Daily Caffeine Use   Patient does not get regular exercise.    Past Surgical History:  Procedure Laterality Date  . CHOLECYSTECTOMY N/A 03/25/2014   Procedure: LAPAROSCOPIC CHOLECYSTECTOMY;  Surgeon: Ralene Ok, MD;  Location: [place];  Service: General;  Laterality: N/A;  . DILATION AND CURETTAGE OF UTERUS     miscarriage  . LAPAROSCOPY  5/94   x 2  . SIGMOIDECTOMY     Hand-assisted laparoscopic sigmoidectomy with splenic flexure takedown.    Family History  Problem Relation Age of Onset  . Barrett's esophagus Mother   . Coronary artery disease Mother   . Congestive Heart Failure Mother   . Hypertension Mother   . Fibromyalgia Mother   . Chronic fatigue Mother   . Anemia Mother   . Irritable bowel syndrome Mother   . Atrial fibrillation Mother   . Colon polyps Father   . Diabetes Father   . Hypertension Father   . Heart disease Father   . Ehlers-Danlos syndrome Daughter   . Anemia Daughter    . Other Daughter        Postural orthostatic hypotension  . Irritable bowel syndrome Daughter   . Alcohol abuse Other   . Arthritis Other   . Hyperlipidemia Other   . Hypertension Other   . Stroke Other   . Coronary artery disease Other   . Clotting disorder Other        Government social research officer  . Crohn's disease Cousin   . Diabetes Paternal Aunt   . Diabetes Paternal Grandfather   . Colon cancer Neg Hx     Allergies  Allergen Reactions  . Hydrocodone Nausea Only and Other (See Comments)    Hallucination   . Dilaudid [Hydromorphone Hcl]   . Latex Rash  . Penicillins Rash and Other (See Comments)    Childhood allergy    Current Outpatient Medications on File Prior to Visit  Medication Sig Dispense Refill  . albuterol (PROVENTIL HFA;VENTOLIN HFA) 108 (90 Base) MCG/ACT inhaler Inhale 2 puffs into the lungs every 6 (six) hours as needed for wheezing or shortness of breath. 1 Inhaler 3  . diclofenac sodium (VOLTAREN) 1 % GEL Apply 2 g topically 3 (three) times daily as needed. Apply upto 3 grams tid prn; use sparingly on dry skin. 100 g 3  . fluticasone (FLONASE) 50 MCG/ACT nasal spray SHAKE LIQUID AND USE 2 SPRAYS IN EACH NOSTRIL DAILY (Patient taking differently: SHAKE LIQUID AND USE 2 SPRAYS IN EACH NOSTRIL DAILY PRN) 16 g 3  . ibuprofen (ADVIL,MOTRIN) 800 MG tablet Take 1 tablet (800 mg total) by mouth 3 (three) times daily. (Patient taking differently: Take 800 mg by mouth 3 (three) times daily as needed. ) 21 tablet 0  . levocetirizine (XYZAL) 5 MG tablet TAKE 1 TABLET(5 MG) BY MOUTH EVERY EVENING 90 tablet 1  . levothyroxine (SYNTHROID, LEVOTHROID) 125 MCG tablet TAKE 1 TABLET BY MOUTH EVERY DAY BEFORE BREAKFAST 90 tablet 0  . montelukast (SINGULAIR) 10 MG tablet Take 1 tablet (10 mg total) by mouth at bedtime. 90 tablet 1  . omeprazole (PRILOSEC) 20 MG capsule Take 1 capsule (20 mg total) by mouth daily. 30 capsule 3  . OVER THE COUNTER MEDICATION Take 2 capsules by mouth 2  (two) times daily. TUMERIC     No current facility-administered medications on file prior to visit.  BP 116/63 (BP Location: Right Arm, Patient Position: Sitting, Cuff Size: Large)   Pulse 73   Temp 99 F (37.2 C) (Oral)   Resp 16   Ht 5\' 9"  (1.753 m)   Wt 225 lb (102.1 kg)   SpO2 98%   BMI 33.23 kg/m       Objective:   Physical Exam Constitutional:      Appearance: Normal appearance.  Neurological:     Mental Status: She is alert and oriented to person, place, and time.  Psychiatric:        Mood and Affect: Affect is flat.        Behavior: Behavior normal.        Thought Content: Thought content normal.        Judgment: Judgment normal.           Assessment & Plan:  Depression- fair control.  She has had some improvement in her fibro pain. Advised pt as follows:  Decrease lexapro from 10mg  to 5mg . Increase cymbalta from 30 mg to 60mg . Plan follow up in 6 weeks.   Fibromyalgia- improved on cymbalta, continue same.  B12 deficiency- continue b12 injections monthly.

## 2018-09-05 ENCOUNTER — Ambulatory Visit (INDEPENDENT_AMBULATORY_CARE_PROVIDER_SITE_OTHER): Payer: BLUE CROSS/BLUE SHIELD | Admitting: *Deleted

## 2018-09-05 DIAGNOSIS — E538 Deficiency of other specified B group vitamins: Secondary | ICD-10-CM | POA: Diagnosis not present

## 2018-09-05 MED ORDER — CYANOCOBALAMIN 1000 MCG/ML IJ SOLN
1000.0000 ug | Freq: Once | INTRAMUSCULAR | Status: AC
  Administered 2018-09-05: 1000 ug via INTRAMUSCULAR

## 2018-09-05 NOTE — Progress Notes (Signed)
Reviewed.  Debbrah Alar NP

## 2018-09-05 NOTE — Progress Notes (Signed)
Pt here for monthly B12 injection per order from [facility].  B12 1070mcg give and pt tolerated injection well.  Next B12 injection scheduled for 1 month.

## 2018-09-19 ENCOUNTER — Ambulatory Visit (INDEPENDENT_AMBULATORY_CARE_PROVIDER_SITE_OTHER): Payer: BLUE CROSS/BLUE SHIELD | Admitting: Family

## 2018-09-19 ENCOUNTER — Encounter: Payer: Self-pay | Admitting: Family

## 2018-09-19 VITALS — BP 126/65 | HR 84 | Temp 98.5°F | Resp 16 | Ht 69.0 in | Wt 229.0 lb

## 2018-09-19 DIAGNOSIS — R400 Somnolence: Secondary | ICD-10-CM

## 2018-09-19 DIAGNOSIS — J45909 Unspecified asthma, uncomplicated: Secondary | ICD-10-CM | POA: Insufficient documentation

## 2018-09-19 DIAGNOSIS — E039 Hypothyroidism, unspecified: Secondary | ICD-10-CM

## 2018-09-19 DIAGNOSIS — F329 Major depressive disorder, single episode, unspecified: Secondary | ICD-10-CM

## 2018-09-19 DIAGNOSIS — E538 Deficiency of other specified B group vitamins: Secondary | ICD-10-CM | POA: Diagnosis not present

## 2018-09-19 DIAGNOSIS — F32A Depression, unspecified: Secondary | ICD-10-CM

## 2018-09-19 DIAGNOSIS — K219 Gastro-esophageal reflux disease without esophagitis: Secondary | ICD-10-CM

## 2018-09-19 DIAGNOSIS — M797 Fibromyalgia: Secondary | ICD-10-CM | POA: Diagnosis not present

## 2018-09-19 NOTE — Progress Notes (Signed)
Subjective:    Patient ID: Christine [name], female    DOB: 26-Dec-1970, 48 y.o.   MRN: [id number]  HPI  Patient is a 48 year old female who presents today for follow-up.  Depression- last visit depression control was only fair.  We decreased her Lexapro from 10 mg to 5 mg.  Cymbalta was increased from 30 to 60 mg.  She reports that she continues to have low energy feel like she is in a fog.  Fibromyalgia had noted some improvement on the 30 mg of Cymbalta.  This was increased to 60 for depression purposes. Reports that she is very tired all the time.  Mom has sleep apnea. She has put some weight back on.  She reports that she feels like her fibro-pain has improved on the Cymbalta. Wt Readings from Last 3 Encounters:  09/19/18 229 lb (103.9 kg)  08/08/18 225 lb (102.1 kg)  07/04/18 225 lb (102.1 kg)    Lab Results  Component Value Date   TSH 1.41 07/04/2018    B12 deficiency- she is maintained on monthly injections.  Last injection was on February 4.  Asthma- maintained on singulair 10mg . Reports that she only uses albuterol occasionally.  GERD- maintained on prilosec maintained otc. Notes that if she misses for a few days she feels horrible.  Otherwise stable.   Review of Systems See HPI  Past Medical History:  Diagnosis Date  . Allergy   . Anemia    stated h/o pernicious anemia  . Arthritis    osteoarthritis  . Asthma    Acute asthmatic bronchitis 2011 while pregnant  . Carpal tunnel syndrome    both wrist  . Depression   . Diverticulitis    recurrent  . Family history of anesthesia complication    nausea and vomiting  . Fibromyalgia   . GERD (gastroesophageal reflux disease)   . Hypothyroidism   . Migraine, unspecified, without mention of intractable migraine without mention of status migrainosus   . Miscarriage 2007  . PONV (postoperative nausea and vomiting)   . Thyroid disease      Social History   Socioeconomic History  . Marital status: Married    Spouse name: Not on file  . Number of children: 2  . Years of education: Not on file  . Highest education level: Not on file  Occupational History  . Not on file  Social Needs  . Financial resource strain: Not on file  . Food insecurity:    Worry: Not on file    Inability: Not on file  . Transportation needs:    Medical: Not on file    Non-medical: Not on file  Tobacco Use  . Smoking status: Former Smoker    Packs/day: 0.50    Years: 3.00    Pack years: 1.50    Types: Cigarettes    Last attempt to quit: 01/31/2000    Years since quitting: 18.6  . Smokeless tobacco: Never Used  Substance and Sexual Activity  . Alcohol use: Yes    Alcohol/week: 2.0 standard drinks    Types: 2 Glasses of wine per week    Comment: social  . Drug use: No  . Sexual activity: Yes    Birth control/protection: I.U.D.  Lifestyle  . Physical activity:    Days per week: Not on file    Minutes per session: Not on file  . Stress: Not on file  Relationships  . Social connections:    Talks on phone: Not on file  Gets together: Not on file    Attends religious service: Not on file    Active member of club or organization: Not on file    Attends meetings of clubs or organizations: Not on file    Relationship status: Not on file  . Intimate partner violence:    Fear of current or ex partner: Not on file    Emotionally abused: Not on file    Physically abused: Not on file    Forced sexual activity: Not on file  Other Topics Concern  . Not on file  Social History Narrative   Married, 2 daughters (4 and 40) son age 36   Occupation: Housewife   Daily Caffeine Use   Patient does not get regular exercise.    Past Surgical History:  Procedure Laterality Date  . CHOLECYSTECTOMY N/A 03/25/2014   Procedure: LAPAROSCOPIC CHOLECYSTECTOMY;  Surgeon: Ralene Ok, MD;  Location: [place];  Service: General;  Laterality: N/A;  . DILATION AND CURETTAGE OF UTERUS     miscarriage  . LAPAROSCOPY  5/94   x  2  . SIGMOIDECTOMY     Hand-assisted laparoscopic sigmoidectomy with splenic flexure takedown.    Family History  Problem Relation Age of Onset  . Barrett's esophagus Mother   . Coronary artery disease Mother   . Congestive Heart Failure Mother   . Hypertension Mother   . Fibromyalgia Mother   . Chronic fatigue Mother   . Anemia Mother   . Irritable bowel syndrome Mother   . Atrial fibrillation Mother   . Colon polyps Father   . Diabetes Father   . Hypertension Father   . Heart disease Father   . Ehlers-Danlos syndrome Daughter   . Anemia Daughter   . Other Daughter        Postural orthostatic hypotension  . Irritable bowel syndrome Daughter   . Alcohol abuse Other   . Arthritis Other   . Hyperlipidemia Other   . Hypertension Other   . Stroke Other   . Coronary artery disease Other   . Clotting disorder Other        Government social research officer  . Crohn's disease Cousin   . Diabetes Paternal Aunt   . Diabetes Paternal Grandfather   . Colon cancer Neg Hx     Allergies  Allergen Reactions  . Hydrocodone Nausea Only and Other (See Comments)    Hallucination   . Dilaudid [Hydromorphone Hcl]   . Latex Rash  . Penicillins Rash and Other (See Comments)    Childhood allergy    Current Outpatient Medications on File Prior to Visit  Medication Sig Dispense Refill  . albuterol (PROVENTIL HFA;VENTOLIN HFA) 108 (90 Base) MCG/ACT inhaler Inhale 2 puffs into the lungs every 6 (six) hours as needed for wheezing or shortness of breath. 1 Inhaler 3  . diclofenac sodium (VOLTAREN) 1 % GEL Apply 2 g topically 3 (three) times daily as needed. Apply upto 3 grams tid prn; use sparingly on dry skin. 100 g 3  . DULoxetine (CYMBALTA) 60 MG capsule Take 1 capsule (60 mg total) by mouth daily. 30 capsule 3  . escitalopram (LEXAPRO) 5 MG tablet Take 1 tablet (5 mg total) by mouth daily. 30 tablet 3  . fluticasone (FLONASE) 50 MCG/ACT nasal spray SHAKE LIQUID AND USE 2 SPRAYS IN EACH NOSTRIL DAILY  (Patient taking differently: SHAKE LIQUID AND USE 2 SPRAYS IN EACH NOSTRIL DAILY PRN) 16 g 3  . ibuprofen (ADVIL,MOTRIN) 800 MG tablet Take 1 tablet (800  mg total) by mouth 3 (three) times daily. (Patient taking differently: Take 800 mg by mouth 3 (three) times daily as needed. ) 21 tablet 0  . levocetirizine (XYZAL) 5 MG tablet TAKE 1 TABLET(5 MG) BY MOUTH EVERY EVENING 90 tablet 1  . levothyroxine (SYNTHROID, LEVOTHROID) 125 MCG tablet TAKE 1 TABLET BY MOUTH EVERY DAY BEFORE BREAKFAST 90 tablet 0  . montelukast (SINGULAIR) 10 MG tablet Take 1 tablet (10 mg total) by mouth at bedtime. 90 tablet 1  . omeprazole (PRILOSEC) 20 MG capsule Take 1 capsule (20 mg total) by mouth daily. 30 capsule 3  . OVER THE COUNTER MEDICATION Take 2 capsules by mouth 2 (two) times daily. TUMERIC     No current facility-administered medications on file prior to visit.     BP 126/65 (BP Location: Right Arm, Patient Position: Sitting, Cuff Size: Large)   Pulse 84   Temp 98.5 F (36.9 C) (Oral)   Resp 16   Ht 5\' 9"  (1.753 m)   Wt 229 lb (103.9 kg)   SpO2 98%   BMI 33.82 kg/m       Objective:   Physical Exam Constitutional:      Appearance: She is well-developed.  Neck:     Musculoskeletal: Neck supple.     Thyroid: No thyromegaly.  Cardiovascular:     Rate and Rhythm: Normal rate and regular rhythm.     Heart sounds: Normal heart sounds. No murmur.  Pulmonary:     Effort: Pulmonary effort is normal. No respiratory distress.     Breath sounds: Normal breath sounds. No wheezing.  Skin:    General: Skin is warm and dry.  Neurological:     Mental Status: She is alert and oriented to person, place, and time.  Psychiatric:        Behavior: Behavior normal.        Thought Content: Thought content normal.        Judgment: Judgment normal.     Comments: Slightly flattened affect           Assessment & Plan:  Depression- fair control at this point.  It is hard to decipher if her symptoms are more  related to fatigue versus depression at this point.  Will continue current meds/doses pending completion of a sleep study.  If sleep study is negative then would consider discontinuing Lexapro and adding Wellbutrin to her regimen.  Fibromyalgia- stable on Cymbalta.  Continue same.  GERD-stable on over-the-counter Prilosec.  Continue same.  Asthma- stable on Singulair and as needed albuterol.  Continue same.  Hypothyroidism-TSH is stable.  Continue current dose of Synthroid.  B12 deficiency-stable on monthly injections.  Continue same.  Daytime somnolence-will obtain sleep study to rule out sleep apnea.

## 2018-09-19 NOTE — Patient Instructions (Signed)
You should be contacted about your home sleep study.

## 2018-09-30 ENCOUNTER — Other Ambulatory Visit: Payer: Self-pay | Admitting: Family

## 2018-10-04 ENCOUNTER — Ambulatory Visit: Payer: Self-pay

## 2018-10-04 ENCOUNTER — Ambulatory Visit (INDEPENDENT_AMBULATORY_CARE_PROVIDER_SITE_OTHER): Payer: BLUE CROSS/BLUE SHIELD

## 2018-10-04 DIAGNOSIS — E538 Deficiency of other specified B group vitamins: Secondary | ICD-10-CM

## 2018-10-04 MED ORDER — CYANOCOBALAMIN 1000 MCG/ML IJ SOLN
1000.0000 ug | Freq: Once | INTRAMUSCULAR | Status: AC
  Administered 2018-10-04: 1000 ug via INTRAMUSCULAR

## 2018-10-04 NOTE — Progress Notes (Signed)
Pt here for monthly B12 injection per Debbrah Alar FNP  B12 1088mcg given left deltoid  IM, and pt tolerated injection well.  Next B12 injection scheduled for one month,

## 2018-10-10 NOTE — Progress Notes (Signed)
Note reviewed.  Konner Warrior S O'Sullivan NP 

## 2018-10-18 ENCOUNTER — Other Ambulatory Visit: Payer: Self-pay | Admitting: Family

## 2018-10-28 ENCOUNTER — Encounter: Payer: Self-pay | Admitting: Family

## 2018-10-30 ENCOUNTER — Encounter: Payer: Self-pay | Admitting: Family

## 2018-10-30 ENCOUNTER — Telehealth: Payer: Self-pay | Admitting: Family

## 2018-10-30 MED ORDER — CIPROFLOXACIN HCL 0.3 % OP SOLN: 1.0000 [drp] | OPHTHALMIC | 0 refills | Status: DC

## 2018-10-30 MED ORDER — CYANOCOBALAMIN 1000 MCG/ML IJ SOLN: 1000.0000 ug | INTRAMUSCULAR | 4 refills | Status: DC

## 2018-10-30 MED ORDER — "SYRINGE 23G X 1"" 3 ML MISC": 0 refills | Status: DC

## 2018-10-30 MED ORDER — FLUTICASONE PROPIONATE 50 MCG/ACT NA SUSP: 2.0000 | Freq: Every day | NASAL | 5 refills | Status: DC

## 2018-10-30 MED ORDER — PREDNISONE 10 MG PO TABS: ORAL_TABLET | ORAL | 0 refills | Status: DC

## 2018-10-30 NOTE — Telephone Encounter (Signed)
Please advise 

## 2018-10-30 NOTE — Telephone Encounter (Signed)
I

## 2018-10-30 NOTE — Telephone Encounter (Signed)
Copied from [place] [id number]. Topic: Quick Communication - Rx Refill/Question >> Oct 30, 2018  1:48 PM Scherrie Gerlach wrote: Medication: cyanocobalamin ((VITAMIN B-12)) injection 1,000 mcg [place] for b12 med  Pt had B12 injection scheduled for wed, however wants to know if the dr would consider calling this into the pharamcy and pt give injection to herself.  Pt states she has done in the past, so is able if ok with the dr.

## 2018-10-30 NOTE — Telephone Encounter (Signed)
Left message on cell to check mychart. '  No need for office visit. Please disregard.

## 2018-11-01 ENCOUNTER — Ambulatory Visit: Payer: Self-pay

## 2018-12-11 ENCOUNTER — Telehealth: Payer: Self-pay | Admitting: Family

## 2018-12-11 NOTE — Telephone Encounter (Signed)
Copied from [place] [id number]. Topic: Quick Communication - Rx Refill/Question >> Dec 11, 2018  4:33 PM Oneta Rack wrote: Medication: ciprofloxacin (CILOXAN) 0.3 % ophthalmic solution    Preferred Pharmacy (with phone number or street name):  [facility], [place] [address]  Agent: Please be advised that RX refills may take up to 3 business days. We ask that you follow-up with your pharmacy.

## 2018-12-12 NOTE — Telephone Encounter (Signed)
Please contact pt to schedule an OV today if she believes she has pink eye.

## 2018-12-12 NOTE — Telephone Encounter (Signed)
Please advise 

## 2018-12-12 NOTE — Telephone Encounter (Signed)
Left message to return call to schedule a virtual visit with [facility].

## 2018-12-18 ENCOUNTER — Other Ambulatory Visit: Payer: Self-pay

## 2018-12-18 ENCOUNTER — Ambulatory Visit (INDEPENDENT_AMBULATORY_CARE_PROVIDER_SITE_OTHER): Payer: BLUE CROSS/BLUE SHIELD | Admitting: Family

## 2018-12-18 DIAGNOSIS — F32A Depression, unspecified: Secondary | ICD-10-CM

## 2018-12-18 DIAGNOSIS — E538 Deficiency of other specified B group vitamins: Secondary | ICD-10-CM

## 2018-12-18 DIAGNOSIS — M797 Fibromyalgia: Secondary | ICD-10-CM

## 2018-12-18 DIAGNOSIS — F329 Major depressive disorder, single episode, unspecified: Secondary | ICD-10-CM | POA: Diagnosis not present

## 2018-12-18 DIAGNOSIS — K219 Gastro-esophageal reflux disease without esophagitis: Secondary | ICD-10-CM

## 2018-12-18 DIAGNOSIS — J302 Other seasonal allergic rhinitis: Secondary | ICD-10-CM | POA: Diagnosis not present

## 2018-12-18 DIAGNOSIS — J45909 Unspecified asthma, uncomplicated: Secondary | ICD-10-CM

## 2018-12-18 NOTE — Progress Notes (Signed)
Virtual Visit via Video Note  I connected with Mirren Gest on 12/18/18 at  9:20 AM EDT by a video enabled telemedicine application and verified that I am speaking with the correct person using two identifiers. This visit type was conducted due to national recommendations for restrictions regarding the COVID-19 Pandemic (e.g. social distancing).  This format is felt to be most appropriate for this patient at this time.   I discussed the limitations of evaluation and management by telemedicine and the availability of in person appointments. The patient expressed understanding and agreed to proceed.  Only the patient and myself were on today's video visit. The patient was at home and I was in my office at the time of today's visit.   History of Present Illness:  Patient is a 48 yr old female who presents today for follow up.  Depression- She continues lexapro 5mg  as well as cymbalta 60 mg. Reports that her fatigue is better. Has more of a problem going to sleep but once she falls asleep she stays asleep.  No longer feeling like she is in a fog.   Allergies- still having daily itching but better with pataday otc. She is also maintained on xzyal/flonase.   GERD- continues prilosec with good control of gerd.  Asthma- reports that asthma symptoms come and goes with allergies.  Reports that she has good relief. Reports much tbeter on singulair.     Observations/Objective:    Gen: Awake, alert, no acute distress Resp: Breathing is even and non-labored Psych: calm/pleasant demeanor Neuro: Alert and Oriented x 3, + facial symmetry, speech is clear.    Assessment and Plan:Follow Up Instructions:  Depression- improved/stable on current regimen. Will continue same.  Fibromyalgia- remains improved with cymbalta. Continue same.  Seasonal allergies- stable with current meds. Continue same.  GERD- stable on PPI, continue same.   Asthma- stable on current regimen. Continue same.   b12  deficiency- continues monthly injections at home.    I discussed the assessment and treatment plan with the patient. The patient was provided an opportunity to ask questions and all were answered. The patient agreed with the plan and demonstrated an understanding of the instructions.   The patient was advised to call back or seek an in-person evaluation if the symptoms worsen or if the condition fails to improve as anticipated.    Nance Pear, NP

## 2018-12-19 ENCOUNTER — Encounter: Payer: Self-pay | Admitting: Family

## 2018-12-20 MED ORDER — DULOXETINE HCL 60 MG PO CPEP: 60.0000 mg | ORAL_CAPSULE | Freq: Every day | ORAL | 5 refills | Status: DC

## 2018-12-31 ENCOUNTER — Other Ambulatory Visit: Payer: Self-pay | Admitting: Family

## 2019-01-10 DIAGNOSIS — M7662 Achilles tendinitis, left leg: Secondary | ICD-10-CM | POA: Diagnosis not present

## 2019-01-10 DIAGNOSIS — M722 Plantar fascial fibromatosis: Secondary | ICD-10-CM | POA: Diagnosis not present

## 2019-01-16 ENCOUNTER — Other Ambulatory Visit: Payer: Self-pay

## 2019-01-16 MED ORDER — ESCITALOPRAM OXALATE 5 MG PO TABS: 5.0000 mg | ORAL_TABLET | Freq: Every day | ORAL | 3 refills | Status: DC

## 2019-01-25 ENCOUNTER — Other Ambulatory Visit: Payer: Self-pay | Admitting: Family

## 2019-01-26 ENCOUNTER — Other Ambulatory Visit: Payer: Self-pay

## 2019-01-26 MED ORDER — LEVOTHYROXINE SODIUM 125 MCG PO TABS: 125.0000 ug | ORAL_TABLET | Freq: Every day | ORAL | 0 refills | Status: DC

## 2019-03-12 ENCOUNTER — Encounter: Payer: Self-pay | Admitting: Family

## 2019-03-12 ENCOUNTER — Ambulatory Visit (INDEPENDENT_AMBULATORY_CARE_PROVIDER_SITE_OTHER): Payer: BC Managed Care – PPO | Admitting: Family

## 2019-03-12 ENCOUNTER — Other Ambulatory Visit: Payer: Self-pay

## 2019-03-12 VITALS — Temp 98.0°F | Wt 229.0 lb

## 2019-03-12 DIAGNOSIS — R0989 Other specified symptoms and signs involving the circulatory and respiratory systems: Secondary | ICD-10-CM

## 2019-03-12 DIAGNOSIS — E039 Hypothyroidism, unspecified: Secondary | ICD-10-CM

## 2019-03-12 DIAGNOSIS — R198 Other specified symptoms and signs involving the digestive system and abdomen: Secondary | ICD-10-CM

## 2019-03-12 MED ORDER — PANTOPRAZOLE SODIUM 40 MG PO TBEC: 40.0000 mg | DELAYED_RELEASE_TABLET | Freq: Every day | ORAL | 3 refills | Status: DC

## 2019-03-12 NOTE — Progress Notes (Signed)
Virtual Visit via Video Note  I connected with Christine [name] on 03/12/19 at  9:40 AM EDT by a video enabled telemedicine application and verified that I am speaking with the correct person using two identifiers.  Location: Patient: home Provider: home   I discussed the limitations of evaluation and management by telemedicine and the availability of in person appointments. The patient expressed understanding and agreed to proceed.  History of Present Illness:  Patient is a 48 yr old female who presents today with chief complaint of globus sensation. Notes that it has been present x 3-4 weeks Has been taking OTC prilosec 20mg  once daily. Notes symptoms are worse with laying down at night.  Reports some reflux symptoms. Also feels like her "face is puffy" which is common for her to have when "my thyroid is off." Reports weight has been stable.   Past Medical History:  Diagnosis Date  . Allergy   . Anemia    stated h/o pernicious anemia  . Arthritis    osteoarthritis  . Asthma    Acute asthmatic bronchitis 2011 while pregnant  . Carpal tunnel syndrome    both wrist  . Depression   . Diverticulitis    recurrent  . Family history of anesthesia complication    nausea and vomiting  . Fibromyalgia   . GERD (gastroesophageal reflux disease)   . Hypothyroidism   . Migraine, unspecified, without mention of intractable migraine without mention of status migrainosus   . Miscarriage 2007  . PONV (postoperative nausea and vomiting)   . Thyroid disease      Social History   Socioeconomic History  . Marital status: Married    Spouse name: Not on file  . Number of children: 2  . Years of education: Not on file  . Highest education level: Not on file  Occupational History  . Not on file  Social Needs  . Financial resource strain: Not on file  . Food insecurity    Worry: Not on file    Inability: Not on file  . Transportation needs    Medical: Not on file    Non-medical: Not  on file  Tobacco Use  . Smoking status: Former Smoker    Packs/day: 0.50    Years: 3.00    Pack years: 1.50    Types: Cigarettes    Quit date: 01/31/2000    Years since quitting: 19.1  . Smokeless tobacco: Never Used  Substance and Sexual Activity  . Alcohol use: Yes    Alcohol/week: 2.0 standard drinks    Types: 2 Glasses of wine per week    Comment: social  . Drug use: No  . Sexual activity: Yes    Birth control/protection: I.U.D.  Lifestyle  . Physical activity    Days per week: Not on file    Minutes per session: Not on file  . Stress: Not on file  Relationships  . Social Herbalist on phone: Not on file    Gets together: Not on file    Attends religious service: Not on file    Active member of club or organization: Not on file    Attends meetings of clubs or organizations: Not on file    Relationship status: Not on file  . Intimate partner violence    Fear of current or ex partner: Not on file    Emotionally abused: Not on file    Physically abused: Not on file    Forced sexual  activity: Not on file  Other Topics Concern  . Not on file  Social History Narrative   Married, 2 daughters (69 and 43) son age 16   Occupation: Housewife   Daily Caffeine Use   Patient does not get regular exercise.    Past Surgical History:  Procedure Laterality Date  . CHOLECYSTECTOMY N/A 03/25/2014   Procedure: LAPAROSCOPIC CHOLECYSTECTOMY;  Surgeon: Ralene Ok, MD;  Location: [place];  Service: General;  Laterality: N/A;  . DILATION AND CURETTAGE OF UTERUS     miscarriage  . LAPAROSCOPY  5/94   x 2  . SIGMOIDECTOMY     Hand-assisted laparoscopic sigmoidectomy with splenic flexure takedown.    Family History  Problem Relation Age of Onset  . Barrett's esophagus Mother   . Coronary artery disease Mother   . Congestive Heart Failure Mother   . Hypertension Mother   . Fibromyalgia Mother   . Chronic fatigue Mother   . Anemia Mother   . Irritable bowel syndrome  Mother   . Atrial fibrillation Mother   . Colon polyps Father   . Diabetes Father   . Hypertension Father   . Heart disease Father   . Ehlers-Danlos syndrome Daughter   . Anemia Daughter   . Other Daughter        Postural orthostatic hypotension  . Irritable bowel syndrome Daughter   . Alcohol abuse Other   . Arthritis Other   . Hyperlipidemia Other   . Hypertension Other   . Stroke Other   . Coronary artery disease Other   . Clotting disorder Other        Government social research officer  . Crohn's disease Cousin   . Diabetes Paternal Aunt   . Diabetes Paternal Grandfather   . Colon cancer Neg Hx     Allergies  Allergen Reactions  . Hydrocodone Nausea Only and Other (See Comments)    Hallucination   . Dilaudid [Hydromorphone Hcl]   . Latex Rash  . Penicillins Rash and Other (See Comments)    Childhood allergy    Current Outpatient Medications on File Prior to Visit  Medication Sig Dispense Refill  . albuterol (PROVENTIL HFA;VENTOLIN HFA) 108 (90 Base) MCG/ACT inhaler Inhale 2 puffs into the lungs every 6 (six) hours as needed for wheezing or shortness of breath. 1 Inhaler 3  . cyanocobalamin (,VITAMIN B-12,) 1000 MCG/ML injection Inject 1 mL (1,000 mcg total) into the skin every 30 (thirty) days. 3 mL 4  . diclofenac sodium (VOLTAREN) 1 % GEL Apply 2 g topically 3 (three) times daily as needed. Apply upto 3 grams tid prn; use sparingly on dry skin. 100 g 3  . DULoxetine (CYMBALTA) 60 MG capsule Take 1 capsule (60 mg total) by mouth daily. 30 capsule 5  . escitalopram (LEXAPRO) 5 MG tablet Take 1 tablet (5 mg total) by mouth daily. 30 tablet 3  . fluticasone (FLONASE) 50 MCG/ACT nasal spray Place 2 sprays into both nostrils daily. 16 g 5  . ibuprofen (ADVIL,MOTRIN) 800 MG tablet Take 1 tablet (800 mg total) by mouth 3 (three) times daily. (Patient taking differently: Take 800 mg by mouth 3 (three) times daily as needed. ) 21 tablet 0  . levocetirizine (XYZAL) 5 MG tablet TAKE 1 TABLET(5  MG) BY MOUTH EVERY EVENING 90 tablet 1  . Levonorgestrel (MIRENA, 52 MG, IU) by Intrauterine route.    Marland Kitchen levothyroxine (SYNTHROID) 125 MCG tablet Take 1 tablet (125 mcg total) by mouth daily before breakfast. 90 tablet 0  .  montelukast (SINGULAIR) 10 MG tablet TAKE 1 TABLET(10 MG) BY MOUTH AT BEDTIME 90 tablet 1  . OVER THE COUNTER MEDICATION Take 2 capsules by mouth 2 (two) times daily. TUMERIC    . Syringe/Needle, Disp, (SYRINGE 3CC/23GX1") 23G X 1" 3 ML MISC Use as directed. 50 each 0   No current facility-administered medications on file prior to visit.     Temp 98 F (36.7 C) (Oral)   Wt 229 lb (103.9 kg)   BMI 33.82 kg/m      Observations/Objective:   Gen: Awake, alert, no acute distress Resp: Breathing is even and non-labored Psych: calm/pleasant demeanor Neuro: Alert and Oriented x 3, + facial symmetry, speech is clear.   Assessment and Plan:  Globus Sensation- New. d/c otc prilosec, will begin protonix 40mg  once daily.  Hypothyroid- obtain follow up TSH. Continue synthroid.    Plan 1 month follow up. If symptoms are not improved may need ? ENT evaluation and/or thyroid US.     Lab Results  Component Value Date   TSH 1.41 07/04/2018    Follow Up Instructions:    I discussed the assessment and treatment plan with the patient. The patient was provided an opportunity to ask questions and all were answered. The patient agreed with the plan and demonstrated an understanding of the instructions.   The patient was advised to call back or seek an in-person evaluation if the symptoms worsen or if the condition fails to improve as anticipated.  Nance Pear, NP

## 2019-03-13 ENCOUNTER — Other Ambulatory Visit: Payer: Self-pay

## 2019-03-13 ENCOUNTER — Ambulatory Visit: Payer: BC Managed Care – PPO | Admitting: Family

## 2019-03-13 ENCOUNTER — Other Ambulatory Visit (INDEPENDENT_AMBULATORY_CARE_PROVIDER_SITE_OTHER): Payer: BC Managed Care – PPO

## 2019-03-13 DIAGNOSIS — E039 Hypothyroidism, unspecified: Secondary | ICD-10-CM

## 2019-03-13 LAB — TSH: TSH: 1.32 u[IU]/mL (ref 0.35–4.50)

## 2019-03-28 ENCOUNTER — Other Ambulatory Visit: Payer: Self-pay

## 2019-03-28 ENCOUNTER — Encounter: Payer: Self-pay | Admitting: Family

## 2019-03-28 DIAGNOSIS — Z1231 Encounter for screening mammogram for malignant neoplasm of breast: Secondary | ICD-10-CM | POA: Diagnosis not present

## 2019-03-28 DIAGNOSIS — Z01419 Encounter for gynecological examination (general) (routine) without abnormal findings: Secondary | ICD-10-CM | POA: Diagnosis not present

## 2019-03-28 DIAGNOSIS — Z6834 Body mass index (BMI) 34.0-34.9, adult: Secondary | ICD-10-CM | POA: Diagnosis not present

## 2019-03-28 LAB — HM MAMMOGRAPHY

## 2019-03-30 ENCOUNTER — Other Ambulatory Visit: Payer: Self-pay

## 2019-03-30 ENCOUNTER — Ambulatory Visit (INDEPENDENT_AMBULATORY_CARE_PROVIDER_SITE_OTHER): Payer: BC Managed Care – PPO | Admitting: Family

## 2019-03-30 VITALS — BP 131/89 | HR 85 | Temp 96.0°F | Resp 16 | Ht 68.5 in | Wt 231.0 lb

## 2019-03-30 DIAGNOSIS — R030 Elevated blood-pressure reading, without diagnosis of hypertension: Secondary | ICD-10-CM

## 2019-03-30 NOTE — Progress Notes (Signed)
Subjective:    Patient ID: Christine [name], female    DOB: 20-Oct-1970, 47 y.o.   MRN: [id number]  HPI  Patient is a 48 yr old female who presents today to discuss elevated blood pressure at her GYN office.  BP Readings from Last 3 Encounters:  03/30/19 131/89  09/19/18 126/65  08/08/18 116/63   Globus sensation- last visit we d/c'd prilosec and gave her a trial of protonix 40mg . Overall symptoms are improving.   173/92 this AM at home.  Has a wrist cuff.   She reports that she has had some tightness in her breathing which resolves with her albuterol.     Review of Systems See HPI  Past Medical History:  Diagnosis Date  . Allergy   . Anemia    stated h/o pernicious anemia  . Arthritis    osteoarthritis  . Asthma    Acute asthmatic bronchitis 2011 while pregnant  . Carpal tunnel syndrome    both wrist  . Depression   . Diverticulitis    recurrent  . Family history of anesthesia complication    nausea and vomiting  . Fibromyalgia   . GERD (gastroesophageal reflux disease)   . Hypothyroidism   . Migraine, unspecified, without mention of intractable migraine without mention of status migrainosus   . Miscarriage 2007  . PONV (postoperative nausea and vomiting)   . Thyroid disease      Social History   Socioeconomic History  . Marital status: Married    Spouse name: Not on file  . Number of children: 2  . Years of education: Not on file  . Highest education level: Not on file  Occupational History  . Not on file  Social Needs  . Financial resource strain: Not on file  . Food insecurity    Worry: Not on file    Inability: Not on file  . Transportation needs    Medical: Not on file    Non-medical: Not on file  Tobacco Use  . Smoking status: Former Smoker    Packs/day: 0.50    Years: 3.00    Pack years: 1.50    Types: Cigarettes    Quit date: 01/31/2000    Years since quitting: 19.1  . Smokeless tobacco: Never Used  Substance and Sexual Activity  .  Alcohol use: Yes    Alcohol/week: 2.0 standard drinks    Types: 2 Glasses of wine per week    Comment: social  . Drug use: No  . Sexual activity: Yes    Birth control/protection: I.U.D.  Lifestyle  . Physical activity    Days per week: Not on file    Minutes per session: Not on file  . Stress: Not on file  Relationships  . Social Herbalist on phone: Not on file    Gets together: Not on file    Attends religious service: Not on file    Active member of club or organization: Not on file    Attends meetings of clubs or organizations: Not on file    Relationship status: Not on file  . Intimate partner violence    Fear of current or ex partner: Not on file    Emotionally abused: Not on file    Physically abused: Not on file    Forced sexual activity: Not on file  Other Topics Concern  . Not on file  Social History Narrative   Married, 2 daughters (73 and 47) son age 10  Occupation: Housewife   Daily Caffeine Use   Patient does not get regular exercise.    Past Surgical History:  Procedure Laterality Date  . CHOLECYSTECTOMY N/A 03/25/2014   Procedure: LAPAROSCOPIC CHOLECYSTECTOMY;  Surgeon: Ralene Ok, MD;  Location: [place];  Service: General;  Laterality: N/A;  . DILATION AND CURETTAGE OF UTERUS     miscarriage  . LAPAROSCOPY  5/94   x 2  . SIGMOIDECTOMY     Hand-assisted laparoscopic sigmoidectomy with splenic flexure takedown.    Family History  Problem Relation Age of Onset  . Barrett's esophagus Mother   . Coronary artery disease Mother   . Congestive Heart Failure Mother   . Hypertension Mother   . Fibromyalgia Mother   . Chronic fatigue Mother   . Anemia Mother   . Irritable bowel syndrome Mother   . Atrial fibrillation Mother   . Colon polyps Father   . Diabetes Father   . Hypertension Father   . Heart disease Father   . Ehlers-Danlos syndrome Daughter   . Anemia Daughter   . Other Daughter        Postural orthostatic hypotension  .  Irritable bowel syndrome Daughter   . Alcohol abuse Other   . Arthritis Other   . Hyperlipidemia Other   . Hypertension Other   . Stroke Other   . Coronary artery disease Other   . Clotting disorder Other        Government social research officer  . Crohn's disease Cousin   . Diabetes Paternal Aunt   . Diabetes Paternal Grandfather   . Colon cancer Neg Hx     Allergies  Allergen Reactions  . Hydrocodone Nausea Only and Other (See Comments)    Hallucination   . Dilaudid [Hydromorphone Hcl]   . Latex Rash  . Penicillins Rash and Other (See Comments)    Childhood allergy    Current Outpatient Medications on File Prior to Visit  Medication Sig Dispense Refill  . albuterol (PROVENTIL HFA;VENTOLIN HFA) 108 (90 Base) MCG/ACT inhaler Inhale 2 puffs into the lungs every 6 (six) hours as needed for wheezing or shortness of breath. 1 Inhaler 3  . cyanocobalamin (,VITAMIN B-12,) 1000 MCG/ML injection Inject 1 mL (1,000 mcg total) into the skin every 30 (thirty) days. 3 mL 4  . diclofenac sodium (VOLTAREN) 1 % GEL Apply 2 g topically 3 (three) times daily as needed. Apply upto 3 grams tid prn; use sparingly on dry skin. 100 g 3  . DULoxetine (CYMBALTA) 60 MG capsule Take 1 capsule (60 mg total) by mouth daily. 30 capsule 5  . escitalopram (LEXAPRO) 5 MG tablet Take 1 tablet (5 mg total) by mouth daily. 30 tablet 3  . fluticasone (FLONASE) 50 MCG/ACT nasal spray Place 2 sprays into both nostrils daily. 16 g 5  . ibuprofen (ADVIL,MOTRIN) 800 MG tablet Take 1 tablet (800 mg total) by mouth 3 (three) times daily. (Patient taking differently: Take 800 mg by mouth 3 (three) times daily as needed. ) 21 tablet 0  . levocetirizine (XYZAL) 5 MG tablet TAKE 1 TABLET(5 MG) BY MOUTH EVERY EVENING 90 tablet 1  . Levonorgestrel (MIRENA, 52 MG, IU) by Intrauterine route.    Marland Kitchen levothyroxine (SYNTHROID) 125 MCG tablet Take 1 tablet (125 mcg total) by mouth daily before breakfast. 90 tablet 0  . montelukast (SINGULAIR) 10 MG  tablet TAKE 1 TABLET(10 MG) BY MOUTH AT BEDTIME 90 tablet 1  . OVER THE COUNTER MEDICATION Take 2 capsules by mouth 2 (  two) times daily. TUMERIC    . pantoprazole (PROTONIX) 40 MG tablet Take 1 tablet (40 mg total) by mouth daily. 30 tablet 3  . Syringe/Needle, Disp, (SYRINGE 3CC/23GX1") 23G X 1" 3 ML MISC Use as directed. 50 each 0   No current facility-administered medications on file prior to visit.     BP 131/89 (BP Location: Right Arm, Patient Position: Sitting, Cuff Size: Large)   Pulse 85   Temp (!) 96 F (35.6 C) (Temporal)   Resp 16   Ht 5' 8.5" (1.74 m)   Wt 231 lb (104.8 kg)   SpO2 99%   BMI 34.61 kg/m       Objective:   Physical Exam Constitutional:      Appearance: She is well-developed.  Neck:     Musculoskeletal: Neck supple.     Thyroid: No thyromegaly.  Cardiovascular:     Rate and Rhythm: Normal rate and regular rhythm.     Heart sounds: Normal heart sounds. No murmur.  Pulmonary:     Effort: Pulmonary effort is normal. No respiratory distress.     Breath sounds: Normal breath sounds. No wheezing.  Skin:    General: Skin is warm and dry.  Neurological:     Mental Status: She is alert and oriented to person, place, and time.  Psychiatric:        Behavior: Behavior normal.        Thought Content: Thought content normal.        Judgment: Judgment normal.           Assessment & Plan:  Elevated blood pressure reading- Looks ok to day in the office. Advised pt to purchase an arm cuff machine and check bp once daily for 1 week. Send me her readings via mychart.

## 2019-03-30 NOTE — Patient Instructions (Signed)
Please monitor blood pressure once daily.  Send me your readings in 1 week.  Send me your readings sooner if you develop bp's>150/90.

## 2019-04-07 ENCOUNTER — Encounter: Payer: Self-pay | Admitting: Family

## 2019-04-08 ENCOUNTER — Encounter: Payer: Self-pay | Admitting: Family

## 2019-04-10 MED ORDER — AMLODIPINE BESYLATE 5 MG PO TABS: 5.0000 mg | ORAL_TABLET | Freq: Every day | ORAL | 3 refills | Status: DC

## 2019-04-11 DIAGNOSIS — M7662 Achilles tendinitis, left leg: Secondary | ICD-10-CM | POA: Diagnosis not present

## 2019-04-11 DIAGNOSIS — M7661 Achilles tendinitis, right leg: Secondary | ICD-10-CM | POA: Diagnosis not present

## 2019-04-11 DIAGNOSIS — M722 Plantar fascial fibromatosis: Secondary | ICD-10-CM | POA: Diagnosis not present

## 2019-04-13 ENCOUNTER — Ambulatory Visit: Payer: BC Managed Care – PPO | Admitting: Family

## 2019-04-18 ENCOUNTER — Encounter: Payer: Self-pay | Admitting: Family

## 2019-04-18 DIAGNOSIS — M25571 Pain in right ankle and joints of right foot: Secondary | ICD-10-CM | POA: Diagnosis not present

## 2019-04-18 DIAGNOSIS — M25572 Pain in left ankle and joints of left foot: Secondary | ICD-10-CM | POA: Diagnosis not present

## 2019-04-18 DIAGNOSIS — M6281 Muscle weakness (generalized): Secondary | ICD-10-CM | POA: Diagnosis not present

## 2019-04-18 NOTE — Telephone Encounter (Signed)
Left message for her to return our call this afternoon and ask for Roderic Ovens RN.  Kim when she calls please ask her if she is having current SOB/chest discomfort. If so needs to go to the ED. If no symptoms please schedule her to see me on Friday with plan that she will go to the ER is symptoms return before her appointment.

## 2019-04-18 NOTE — Telephone Encounter (Signed)
Spoke with patient regarding symptoms.  Patient states dizziness has resolved and chest discomfort has improved slightly.  Denies CP or SOB currently. Episodes typically occur daily lasting up to 30 minutes.  Patient scheduled for in person visit with PCP on Friday, 04/20/2019. Advised if symptoms persist or worsen, to go to ER. Patient verbalized understanding.

## 2019-04-20 ENCOUNTER — Encounter (HOSPITAL_BASED_OUTPATIENT_CLINIC_OR_DEPARTMENT_OTHER): Payer: Self-pay

## 2019-04-20 ENCOUNTER — Ambulatory Visit (INDEPENDENT_AMBULATORY_CARE_PROVIDER_SITE_OTHER): Payer: BC Managed Care – PPO | Admitting: Family

## 2019-04-20 ENCOUNTER — Ambulatory Visit (HOSPITAL_BASED_OUTPATIENT_CLINIC_OR_DEPARTMENT_OTHER)
Admission: RE | Admit: 2019-04-20 | Discharge: 2019-04-20 | Disposition: A | Discharge status: Home / Self Care | Payer: BC Managed Care – PPO | Source: Ambulatory Visit | Attending: Family | Admitting: Family

## 2019-04-20 ENCOUNTER — Other Ambulatory Visit: Payer: Self-pay

## 2019-04-20 VITALS — BP 134/69 | HR 81 | Temp 97.7°F | Resp 16 | Ht 69.0 in | Wt 232.0 lb

## 2019-04-20 DIAGNOSIS — R0602 Shortness of breath: Secondary | ICD-10-CM | POA: Diagnosis not present

## 2019-04-20 DIAGNOSIS — I10 Essential (primary) hypertension: Secondary | ICD-10-CM

## 2019-04-20 DIAGNOSIS — M6281 Muscle weakness (generalized): Secondary | ICD-10-CM | POA: Diagnosis not present

## 2019-04-20 DIAGNOSIS — R002 Palpitations: Secondary | ICD-10-CM | POA: Diagnosis not present

## 2019-04-20 DIAGNOSIS — M25572 Pain in left ankle and joints of left foot: Secondary | ICD-10-CM | POA: Diagnosis not present

## 2019-04-20 DIAGNOSIS — M25571 Pain in right ankle and joints of right foot: Secondary | ICD-10-CM | POA: Diagnosis not present

## 2019-04-20 LAB — POCT URINE PREGNANCY: Preg Test, Ur: NEGATIVE

## 2019-04-20 MED ORDER — IOHEXOL 350 MG/ML SOLN
100.0000 mL | Freq: Once | INTRAVENOUS | Status: AC | PRN
  Administered 2019-04-20: 65 mL via INTRAVENOUS

## 2019-04-20 NOTE — Progress Notes (Addendum)
Subjective:    Patient ID: Christine [name], female    DOB: November 12, 1970, 48 y.o.   MRN: [id number]  HPI   Patient is a 48 yr old female who presents today to discuss shortness of breath/palpitations.   Reports that she is having some light headedness/waves that come over me. Reports that this started after she started bp meds.  Reports some mild discomfort in her chest, she questions if this is asthma related. Reports inhaler helps this.  Reports palpitations have gotten worse the last week.  BP Readings from Last 3 Encounters:  04/20/19 134/69  03/30/19 131/89  09/19/18 126/65   She reports that things have calmed down some at home. Stress is not worse.    Feels like going up and down the steps she is more short of breath.  Grandmother had hx of DVT. She reports that she went to [place] a few weeks ago (3 hrs each way).   Notes that her resting heart rate is in the high 90's.    Review of Systems Past Medical History:  Diagnosis Date  . Allergy   . Anemia    stated h/o pernicious anemia  . Arthritis    osteoarthritis  . Asthma    Acute asthmatic bronchitis 2011 while pregnant  . Carpal tunnel syndrome    both wrist  . Depression   . Diverticulitis    recurrent  . Family history of anesthesia complication    nausea and vomiting  . Fibromyalgia   . GERD (gastroesophageal reflux disease)   . Hypothyroidism   . Migraine, unspecified, without mention of intractable migraine without mention of status migrainosus   . Miscarriage 2007  . PONV (postoperative nausea and vomiting)   . Thyroid disease      Social History   Socioeconomic History  . Marital status: Married    Spouse name: Not on file  . Number of children: 2  . Years of education: Not on file  . Highest education level: Not on file  Occupational History  . Not on file  Social Needs  . Financial resource strain: Not on file  . Food insecurity    Worry: Not on file    Inability: Not on file  .  Transportation needs    Medical: Not on file    Non-medical: Not on file  Tobacco Use  . Smoking status: Former Smoker    Packs/day: 0.50    Years: 3.00    Pack years: 1.50    Types: Cigarettes    Quit date: 01/31/2000    Years since quitting: 19.2  . Smokeless tobacco: Never Used  Substance and Sexual Activity  . Alcohol use: Yes    Alcohol/week: 2.0 standard drinks    Types: 2 Glasses of wine per week    Comment: social  . Drug use: No  . Sexual activity: Yes    Birth control/protection: I.U.D.  Lifestyle  . Physical activity    Days per week: Not on file    Minutes per session: Not on file  . Stress: Not on file  Relationships  . Social Herbalist on phone: Not on file    Gets together: Not on file    Attends religious service: Not on file    Active member of club or organization: Not on file    Attends meetings of clubs or organizations: Not on file    Relationship status: Not on file  . Intimate partner violence  Fear of current or ex partner: Not on file    Emotionally abused: Not on file    Physically abused: Not on file    Forced sexual activity: Not on file  Other Topics Concern  . Not on file  Social History Narrative   Married, 2 daughters (59 and 75) son age 2   Occupation: Housewife   Daily Caffeine Use   Patient does not get regular exercise.    Past Surgical History:  Procedure Laterality Date  . CHOLECYSTECTOMY N/A 03/25/2014   Procedure: LAPAROSCOPIC CHOLECYSTECTOMY;  Surgeon: Ralene Ok, MD;  Location: [place];  Service: General;  Laterality: N/A;  . DILATION AND CURETTAGE OF UTERUS     miscarriage  . LAPAROSCOPY  5/94   x 2  . SIGMOIDECTOMY     Hand-assisted laparoscopic sigmoidectomy with splenic flexure takedown.    Family History  Problem Relation Age of Onset  . Barrett's esophagus Mother   . Coronary artery disease Mother   . Congestive Heart Failure Mother   . Hypertension Mother   . Fibromyalgia Mother   . Chronic  fatigue Mother   . Anemia Mother   . Irritable bowel syndrome Mother   . Atrial fibrillation Mother   . Colon polyps Father   . Diabetes Father   . Hypertension Father   . Heart disease Father   . Ehlers-Danlos syndrome Daughter   . Anemia Daughter   . Other Daughter        Postural orthostatic hypotension  . Irritable bowel syndrome Daughter   . Alcohol abuse Other   . Arthritis Other   . Hyperlipidemia Other   . Hypertension Other   . Stroke Other   . Coronary artery disease Other   . Clotting disorder Other        Government social research officer  . Crohn's disease Cousin   . Diabetes Paternal Aunt   . Diabetes Paternal Grandfather   . Colon cancer Neg Hx     Allergies  Allergen Reactions  . Hydrocodone Nausea Only and Other (See Comments)    Hallucination   . Dilaudid [Hydromorphone Hcl]   . Latex Rash  . Penicillins Rash and Other (See Comments)    Childhood allergy    Current Outpatient Medications on File Prior to Visit  Medication Sig Dispense Refill  . albuterol (PROVENTIL HFA;VENTOLIN HFA) 108 (90 Base) MCG/ACT inhaler Inhale 2 puffs into the lungs every 6 (six) hours as needed for wheezing or shortness of breath. 1 Inhaler 3  . amLODipine (NORVASC) 5 MG tablet Take 1 tablet (5 mg total) by mouth daily. 30 tablet 3  . cyanocobalamin (,VITAMIN B-12,) 1000 MCG/ML injection Inject 1 mL (1,000 mcg total) into the skin every 30 (thirty) days. 3 mL 4  . diclofenac sodium (VOLTAREN) 1 % GEL Apply 2 g topically 3 (three) times daily as needed. Apply upto 3 grams tid prn; use sparingly on dry skin. 100 g 3  . DULoxetine (CYMBALTA) 60 MG capsule Take 1 capsule (60 mg total) by mouth daily. 30 capsule 5  . escitalopram (LEXAPRO) 5 MG tablet Take 1 tablet (5 mg total) by mouth daily. 30 tablet 3  . fluticasone (FLONASE) 50 MCG/ACT nasal spray Place 2 sprays into both nostrils daily. 16 g 5  . ibuprofen (ADVIL,MOTRIN) 800 MG tablet Take 1 tablet (800 mg total) by mouth 3 (three) times  daily. (Patient taking differently: Take 800 mg by mouth 3 (three) times daily as needed. ) 21 tablet 0  .  levocetirizine (XYZAL) 5 MG tablet TAKE 1 TABLET(5 MG) BY MOUTH EVERY EVENING 90 tablet 1  . Levonorgestrel (MIRENA, 52 MG, IU) by Intrauterine route.    Marland Kitchen levothyroxine (SYNTHROID) 125 MCG tablet Take 1 tablet (125 mcg total) by mouth daily before breakfast. 90 tablet 0  . montelukast (SINGULAIR) 10 MG tablet TAKE 1 TABLET(10 MG) BY MOUTH AT BEDTIME 90 tablet 1  . OVER THE COUNTER MEDICATION Take 2 capsules by mouth 2 (two) times daily. TUMERIC    . pantoprazole (PROTONIX) 40 MG tablet Take 1 tablet (40 mg total) by mouth daily. 30 tablet 3  . Syringe/Needle, Disp, (SYRINGE 3CC/23GX1") 23G X 1" 3 ML MISC Use as directed. 50 each 0   No current facility-administered medications on file prior to visit.     BP 134/69 (BP Location: Right Arm, Patient Position: Sitting, Cuff Size: Large)   Pulse 81   Temp 97.7 F (36.5 C) (Temporal)   Resp 16   Ht 5\' 9"  (1.753 m)   Wt 232 lb (105.2 kg)   SpO2 98%   BMI 34.26 kg/m       Objective:   Physical Exam Constitutional:      Appearance: She is well-developed.  Neck:     Musculoskeletal: Neck supple.     Thyroid: No thyromegaly.  Cardiovascular:     Rate and Rhythm: Normal rate and regular rhythm.     Heart sounds: Normal heart sounds. No murmur.  Pulmonary:     Effort: Pulmonary effort is normal. No respiratory distress.     Breath sounds: Normal breath sounds. No wheezing.  Musculoskeletal:     Right lower leg: 1+ Edema present.     Left lower leg: 1+ Edema present.  Skin:    General: Skin is warm and dry.  Neurological:     Mental Status: She is alert and oriented to person, place, and time.  Psychiatric:        Behavior: Behavior normal.        Thought Content: Thought content normal.        Judgment: Judgment normal.           Assessment & Plan:  SOB- EKG tracing is personally reviewed.  EKG notes NSR.  No acute  changes.  Need to rule out PE. Will obtain CT angio chest. Unfortunagely, lab is currently closed.  I would like to obtain CBC, CMET. TSH is up to date. Will need to schedule her for lab work Monday AM.    HTN- bp is stable on current meds.  Not clear if symptoms are related to addition of amlodipine.  Will see how CTA/labs turn out and then consider possible med change.

## 2019-04-20 NOTE — Patient Instructions (Signed)
Please complete CT scan  On the first floor.

## 2019-04-21 LAB — CBC WITH DIFFERENTIAL/PLATELET
Basophils Absolute: 0.1 10*3/uL (ref 0.0–0.2)
Basos: 1 %
EOS (ABSOLUTE): 0.2 10*3/uL (ref 0.0–0.4)
Eos: 3 %
Hematocrit: 39.4 % (ref 34.0–46.6)
Hemoglobin: 13.1 g/dL (ref 11.1–15.9)
Immature Grans (Abs): 0 10*3/uL (ref 0.0–0.1)
Immature Granulocytes: 0 %
Lymphocytes Absolute: 2.1 10*3/uL (ref 0.7–3.1)
Lymphs: 27 %
MCH: 29.9 pg (ref 26.6–33.0)
MCHC: 33.2 g/dL (ref 31.5–35.7)
MCV: 90 fL (ref 79–97)
Monocytes Absolute: 0.6 10*3/uL (ref 0.1–0.9)
Monocytes: 8 %
Neutrophils Absolute: 4.8 10*3/uL (ref 1.4–7.0)
Neutrophils: 61 %
Platelets: 295 10*3/uL (ref 150–450)
RBC: 4.38 x10E6/uL (ref 3.77–5.28)
RDW: 13 % (ref 11.7–15.4)
WBC: 7.8 10*3/uL (ref 3.4–10.8)

## 2019-04-21 LAB — COMPREHENSIVE METABOLIC PANEL
ALT: 31 IU/L (ref 0–32)
AST: 20 IU/L (ref 0–40)
Albumin/Globulin Ratio: 1.8 (ref 1.2–2.2)
Albumin: 4.5 g/dL (ref 3.8–4.8)
Alkaline Phosphatase: 78 IU/L (ref 39–117)
BUN/Creatinine Ratio: 11 (ref 9–23)
BUN: 8 mg/dL (ref 6–24)
Bilirubin Total: <0.2 mg/dL (ref 0.0–1.2)
CO2: 24 mmol/L (ref 20–29)
Calcium: 9.3 mg/dL (ref 8.7–10.2)
Chloride: 100 mmol/L (ref 96–106)
Creatinine, Ser: 0.75 mg/dL (ref 0.57–1.00)
GFR calc Af Amer: 110 mL/min/{1.73_m2} (ref >59)
GFR calc non Af Amer: 95 mL/min/{1.73_m2} (ref >59)
Globulin, Total: 2.5 g/dL (ref 1.5–4.5)
Glucose: 94 mg/dL (ref 65–99)
Potassium: 4.1 mmol/L (ref 3.5–5.2)
Sodium: 139 mmol/L (ref 134–144)
Total Protein: 7 g/dL (ref 6.0–8.5)

## 2019-04-23 DIAGNOSIS — M25572 Pain in left ankle and joints of left foot: Secondary | ICD-10-CM | POA: Diagnosis not present

## 2019-04-23 DIAGNOSIS — M25571 Pain in right ankle and joints of right foot: Secondary | ICD-10-CM | POA: Diagnosis not present

## 2019-04-23 DIAGNOSIS — M6281 Muscle weakness (generalized): Secondary | ICD-10-CM | POA: Diagnosis not present

## 2019-04-25 DIAGNOSIS — M25571 Pain in right ankle and joints of right foot: Secondary | ICD-10-CM | POA: Diagnosis not present

## 2019-04-25 DIAGNOSIS — M25572 Pain in left ankle and joints of left foot: Secondary | ICD-10-CM | POA: Diagnosis not present

## 2019-04-25 DIAGNOSIS — M6281 Muscle weakness (generalized): Secondary | ICD-10-CM | POA: Diagnosis not present

## 2019-04-30 DIAGNOSIS — M6281 Muscle weakness (generalized): Secondary | ICD-10-CM | POA: Diagnosis not present

## 2019-04-30 DIAGNOSIS — M25572 Pain in left ankle and joints of left foot: Secondary | ICD-10-CM | POA: Diagnosis not present

## 2019-04-30 DIAGNOSIS — M25571 Pain in right ankle and joints of right foot: Secondary | ICD-10-CM | POA: Diagnosis not present

## 2019-05-03 DIAGNOSIS — M25572 Pain in left ankle and joints of left foot: Secondary | ICD-10-CM | POA: Diagnosis not present

## 2019-05-03 DIAGNOSIS — M25571 Pain in right ankle and joints of right foot: Secondary | ICD-10-CM | POA: Diagnosis not present

## 2019-05-08 ENCOUNTER — Other Ambulatory Visit: Payer: Self-pay | Admitting: *Deleted

## 2019-05-08 MED ORDER — LEVOTHYROXINE SODIUM 125 MCG PO TABS: 125.0000 ug | ORAL_TABLET | Freq: Every day | ORAL | 1 refills | Status: DC

## 2019-05-21 ENCOUNTER — Other Ambulatory Visit: Payer: Self-pay

## 2019-05-21 MED ORDER — ESCITALOPRAM OXALATE 5 MG PO TABS: 5.0000 mg | ORAL_TABLET | Freq: Every day | ORAL | 3 refills | Status: DC

## 2019-06-03 DIAGNOSIS — M25572 Pain in left ankle and joints of left foot: Secondary | ICD-10-CM | POA: Diagnosis not present

## 2019-06-03 DIAGNOSIS — M25571 Pain in right ankle and joints of right foot: Secondary | ICD-10-CM | POA: Diagnosis not present

## 2019-06-22 ENCOUNTER — Other Ambulatory Visit: Payer: Self-pay

## 2019-06-22 MED ORDER — ESCITALOPRAM OXALATE 5 MG PO TABS: 5.0000 mg | ORAL_TABLET | Freq: Every day | ORAL | 3 refills | Status: DC

## 2019-07-02 ENCOUNTER — Other Ambulatory Visit: Payer: Self-pay

## 2019-07-02 ENCOUNTER — Encounter: Payer: Self-pay | Admitting: Family

## 2019-07-02 ENCOUNTER — Ambulatory Visit (INDEPENDENT_AMBULATORY_CARE_PROVIDER_SITE_OTHER): Payer: BC Managed Care – PPO | Admitting: Family

## 2019-07-02 VITALS — BP 154/91 | HR 97

## 2019-07-02 DIAGNOSIS — M797 Fibromyalgia: Secondary | ICD-10-CM

## 2019-07-02 DIAGNOSIS — J45909 Unspecified asthma, uncomplicated: Secondary | ICD-10-CM | POA: Diagnosis not present

## 2019-07-02 DIAGNOSIS — F329 Major depressive disorder, single episode, unspecified: Secondary | ICD-10-CM

## 2019-07-02 DIAGNOSIS — E039 Hypothyroidism, unspecified: Secondary | ICD-10-CM

## 2019-07-02 DIAGNOSIS — F32A Depression, unspecified: Secondary | ICD-10-CM

## 2019-07-02 DIAGNOSIS — K219 Gastro-esophageal reflux disease without esophagitis: Secondary | ICD-10-CM | POA: Diagnosis not present

## 2019-07-02 DIAGNOSIS — I10 Essential (primary) hypertension: Secondary | ICD-10-CM | POA: Diagnosis not present

## 2019-07-02 MED ORDER — MONTELUKAST SODIUM 10 MG PO TABS: ORAL_TABLET | ORAL | 1 refills | Status: DC

## 2019-07-02 MED ORDER — AMLODIPINE BESYLATE 5 MG PO TABS: 7.5000 mg | ORAL_TABLET | Freq: Every day | ORAL | 1 refills | Status: DC

## 2019-07-02 MED ORDER — DULOXETINE HCL 60 MG PO CPEP: 60.0000 mg | ORAL_CAPSULE | Freq: Every day | ORAL | 1 refills | Status: DC

## 2019-07-02 MED ORDER — LEVOCETIRIZINE DIHYDROCHLORIDE 5 MG PO TABS: 5.0000 mg | ORAL_TABLET | Freq: Every evening | ORAL | 1 refills | Status: DC

## 2019-07-02 NOTE — Patient Instructions (Signed)
Please increase amlodipine to 1.5 tablets once daily. Check blood pressure once daily for the next few days and then send me your readings via mychart.

## 2019-07-02 NOTE — Progress Notes (Signed)
Virtual Visit via Video Note  I connected with Christine [name] on 07/02/19 at 12:40 PM EST by a video enabled telemedicine application and verified that I am speaking with the correct person using two identifiers.  Location: Patient: home Provider: home   I discussed the limitations of evaluation and management by telemedicine and the availability of in person appointments. The patient expressed understanding and agreed to proceed.  History of Present Illness:  Patient is a 48 yr old female who presents today for follow up.  HTN- maintained on amlodipine 5mg .   142/87 1 month ago  BP Readings from Last 3 Encounters:  07/02/19 (!) 154/91  04/20/19 134/69  03/30/19 131/89   Depression- maintained on lexapro 5mg . Reports mood is better on cymbalta.  Notes that fibromyalgia pain is improved as well.   Reports weight 233 Wt Readings from Last 3 Encounters:  04/20/19 232 lb (105.2 kg)  03/30/19 231 lb (104.8 kg)  03/12/19 229 lb (103.9 kg)   GERD- stable on PPI. Reports improved symptoms.   Hypothyroid- maintained on synthroid.   Lab Results  Component Value Date   TSH 1.32 03/13/2019   Asthma - continues singulair.  Uses albuterol some days.   Past Medical History:  Diagnosis Date  . Allergy   . Anemia    stated h/o pernicious anemia  . Arthritis    osteoarthritis  . Asthma    Acute asthmatic bronchitis 2011 while pregnant  . Carpal tunnel syndrome    both wrist  . Depression   . Diverticulitis    recurrent  . Family history of anesthesia complication    nausea and vomiting  . Fibromyalgia   . GERD (gastroesophageal reflux disease)   . Hypothyroidism   . Migraine, unspecified, without mention of intractable migraine without mention of status migrainosus   . Miscarriage 2007  . PONV (postoperative nausea and vomiting)   . Thyroid disease      Social History   Socioeconomic History  . Marital status: Married    Spouse name: Not on file  . Number of  children: 2  . Years of education: Not on file  . Highest education level: Not on file  Occupational History  . Not on file  Social Needs  . Financial resource strain: Not on file  . Food insecurity    Worry: Not on file    Inability: Not on file  . Transportation needs    Medical: Not on file    Non-medical: Not on file  Tobacco Use  . Smoking status: Former Smoker    Packs/day: 0.50    Years: 3.00    Pack years: 1.50    Types: Cigarettes    Quit date: 01/31/2000    Years since quitting: 19.4  . Smokeless tobacco: Never Used  Substance and Sexual Activity  . Alcohol use: Yes    Alcohol/week: 2.0 standard drinks    Types: 2 Glasses of wine per week    Comment: social  . Drug use: No  . Sexual activity: Yes    Birth control/protection: I.U.D.  Lifestyle  . Physical activity    Days per week: Not on file    Minutes per session: Not on file  . Stress: Not on file  Relationships  . Social Herbalist on phone: Not on file    Gets together: Not on file    Attends religious service: Not on file    Active member of club or organization: Not  on file    Attends meetings of clubs or organizations: Not on file    Relationship status: Not on file  . Intimate partner violence    Fear of current or ex partner: Not on file    Emotionally abused: Not on file    Physically abused: Not on file    Forced sexual activity: Not on file  Other Topics Concern  . Not on file  Social History Narrative   Married, 2 daughters (39 and 12) son age 30   Occupation: Housewife   Daily Caffeine Use   Patient does not get regular exercise.    Past Surgical History:  Procedure Laterality Date  . CHOLECYSTECTOMY N/A 03/25/2014   Procedure: LAPAROSCOPIC CHOLECYSTECTOMY;  Surgeon: Ralene Ok, MD;  Location: [facility];  Service: General;  Laterality: N/A;  . DILATION AND CURETTAGE OF UTERUS     miscarriage  . LAPAROSCOPY  5/94   x 2  . SIGMOIDECTOMY     Hand-assisted laparoscopic  sigmoidectomy with splenic flexure takedown.    Family History  Problem Relation Age of Onset  . Barrett's esophagus Mother   . Coronary artery disease Mother   . Congestive Heart Failure Mother   . Hypertension Mother   . Fibromyalgia Mother   . Chronic fatigue Mother   . Anemia Mother   . Irritable bowel syndrome Mother   . Atrial fibrillation Mother   . Colon polyps Father   . Diabetes Father   . Hypertension Father   . Heart disease Father   . Ehlers-Danlos syndrome Daughter   . Anemia Daughter   . Other Daughter        Postural orthostatic hypotension  . Irritable bowel syndrome Daughter   . Alcohol abuse Other   . Arthritis Other   . Hyperlipidemia Other   . Hypertension Other   . Stroke Other   . Coronary artery disease Other   . Clotting disorder Other        Government social research officer  . Crohn's disease Cousin   . Diabetes Paternal Aunt   . Diabetes Paternal Grandfather   . Colon cancer Neg Hx     Allergies  Allergen Reactions  . Hydrocodone Nausea Only and Other (See Comments)    Hallucination   . Dilaudid [Hydromorphone Hcl]   . Latex Rash  . Penicillins Rash and Other (See Comments)    Childhood allergy    Current Outpatient Medications on File Prior to Visit  Medication Sig Dispense Refill  . albuterol (PROVENTIL HFA;VENTOLIN HFA) 108 (90 Base) MCG/ACT inhaler Inhale 2 puffs into the lungs every 6 (six) hours as needed for wheezing or shortness of breath. 1 Inhaler 3  . amLODipine (NORVASC) 5 MG tablet Take 1 tablet (5 mg total) by mouth daily. 30 tablet 3  . cyanocobalamin (,VITAMIN B-12,) 1000 MCG/ML injection Inject 1 mL (1,000 mcg total) into the skin every 30 (thirty) days. 3 mL 4  . DULoxetine (CYMBALTA) 60 MG capsule Take 1 capsule (60 mg total) by mouth daily. 30 capsule 5  . fluticasone (FLONASE) 50 MCG/ACT nasal spray Place 2 sprays into both nostrils daily. 16 g 5  . ibuprofen (ADVIL,MOTRIN) 800 MG tablet Take 1 tablet (800 mg total) by mouth 3  (three) times daily. (Patient taking differently: Take 800 mg by mouth 3 (three) times daily as needed. ) 21 tablet 0  . levocetirizine (XYZAL) 5 MG tablet TAKE 1 TABLET(5 MG) BY MOUTH EVERY EVENING 90 tablet 1  . Levonorgestrel (MIRENA, 52  MG, IU) by Intrauterine route.    Marland Kitchen levothyroxine (SYNTHROID) 125 MCG tablet Take 1 tablet (125 mcg total) by mouth daily before breakfast. 90 tablet 1  . montelukast (SINGULAIR) 10 MG tablet TAKE 1 TABLET(10 MG) BY MOUTH AT BEDTIME 90 tablet 1  . OVER THE COUNTER MEDICATION Take 2 capsules by mouth 2 (two) times daily. TUMERIC    . pantoprazole (PROTONIX) 40 MG tablet Take 1 tablet (40 mg total) by mouth daily. 30 tablet 3  . Syringe/Needle, Disp, (SYRINGE 3CC/23GX1") 23G X 1" 3 ML MISC Use as directed. 50 each 0   No current facility-administered medications on file prior to visit.     BP (!) 154/91   Pulse 97      Observations/Objective:   Gen: Awake, alert, no acute distress Resp: Breathing is even and non-labored Psych: calm/pleasant demeanor Neuro: Alert and Oriented x 3, + facial symmetry, speech is clear.   Assessment and Plan:  HTN- uncontrolled. Advised pt as follows:  Please increase amlodipine to 1.5 tablets once daily. Check blood pressure once daily for the next few days and then send me your readings via mychart.  Depression- stable/improved. She is now on cymbalta 60mg  and lexapro 5mg . Will d/c lexapro. She is advised to let me know if her symptoms worsen after discontinuation of lexapro.  GERD- stable on PPI. Continue same.  Hypothyroid- tsh stable. Continue current dose of synthroid.  Asthma- stable on singulair and prn albuterol. Continue same.   Fibromyalgia- pain improved on cymbalta.   Follow Up Instructions:    I discussed the assessment and treatment plan with the patient. The patient was provided an opportunity to ask questions and all were answered. The patient agreed with the plan and demonstrated an  understanding of the instructions.   The patient was advised to call back or seek an in-person evaluation if the symptoms worsen or if the condition fails to improve as anticipated.  Nance Pear, NP

## 2019-07-03 DIAGNOSIS — M25572 Pain in left ankle and joints of left foot: Secondary | ICD-10-CM | POA: Diagnosis not present

## 2019-07-03 DIAGNOSIS — M25571 Pain in right ankle and joints of right foot: Secondary | ICD-10-CM | POA: Diagnosis not present

## 2019-07-06 ENCOUNTER — Encounter: Payer: Self-pay | Admitting: Family

## 2019-07-16 ENCOUNTER — Encounter: Payer: Self-pay | Admitting: Family

## 2019-07-16 MED ORDER — PANTOPRAZOLE SODIUM 40 MG PO TBEC: 40.0000 mg | DELAYED_RELEASE_TABLET | Freq: Every day | ORAL | 5 refills | Status: DC

## 2019-07-25 ENCOUNTER — Encounter: Payer: Self-pay | Admitting: Family

## 2019-07-26 MED ORDER — FLUTICASONE-SALMETEROL 250-50 MCG/DOSE IN AEPB: 1.0000 | INHALATION_SPRAY | Freq: Two times a day (BID) | RESPIRATORY_TRACT | 3 refills | Status: DC

## 2019-08-02 DIAGNOSIS — M25571 Pain in right ankle and joints of right foot: Secondary | ICD-10-CM | POA: Diagnosis not present

## 2019-08-02 DIAGNOSIS — M25572 Pain in left ankle and joints of left foot: Secondary | ICD-10-CM | POA: Diagnosis not present

## 2019-08-18 ENCOUNTER — Other Ambulatory Visit: Payer: Self-pay | Admitting: Family

## 2019-08-18 DIAGNOSIS — J029 Acute pharyngitis, unspecified: Secondary | ICD-10-CM

## 2019-10-05 DIAGNOSIS — R52 Pain, unspecified: Secondary | ICD-10-CM | POA: Diagnosis not present

## 2019-10-05 DIAGNOSIS — M1712 Unilateral primary osteoarthritis, left knee: Secondary | ICD-10-CM | POA: Diagnosis not present

## 2019-12-05 ENCOUNTER — Encounter: Payer: Self-pay | Admitting: Family

## 2019-12-05 MED ORDER — LEVOTHYROXINE SODIUM 125 MCG PO TABS: 125.0000 ug | ORAL_TABLET | Freq: Every day | ORAL | 1 refills | Status: DC

## 2019-12-05 MED ORDER — FLUTICASONE PROPIONATE 50 MCG/ACT NA SUSP: 2.0000 | Freq: Every day | NASAL | 5 refills | Status: DC

## 2019-12-19 DIAGNOSIS — M1712 Unilateral primary osteoarthritis, left knee: Secondary | ICD-10-CM | POA: Diagnosis not present

## 2019-12-29 ENCOUNTER — Encounter: Payer: Self-pay | Admitting: Family

## 2020-01-01 ENCOUNTER — Telehealth: Payer: Self-pay | Admitting: Family

## 2020-01-01 ENCOUNTER — Other Ambulatory Visit: Payer: Self-pay

## 2020-01-01 MED ORDER — DULOXETINE HCL 60 MG PO CPEP: 60.0000 mg | ORAL_CAPSULE | Freq: Every day | ORAL | 0 refills | Status: DC

## 2020-01-01 MED ORDER — LEVOCETIRIZINE DIHYDROCHLORIDE 5 MG PO TABS: 5.0000 mg | ORAL_TABLET | Freq: Every evening | ORAL | 0 refills | Status: DC

## 2020-01-01 MED ORDER — MONTELUKAST SODIUM 10 MG PO TABS: ORAL_TABLET | ORAL | 0 refills | Status: DC

## 2020-01-01 NOTE — Telephone Encounter (Signed)
Refills sent to pharmacy. 

## 2020-01-01 NOTE — Telephone Encounter (Signed)
Medication: DULoxetine (CYMBALTA) 60 MG capsule [id number]  levocetirizine (XYZAL) 5 MG tablet [id number]   montelukast (SINGULAIR) 10 MG tablet [id number]   Has the patient contacted their pharmacy? No. (If no, request that the patient contact the pharmacy for the refill.) (If yes, when and what did the pharmacy advise?)  Preferred Pharmacy (with phone number or street name): [id number] - [place], [place] - [address]  [address] [place], [place] [address]  Phone:  [phone] Fax:  [phone]  DEA #:  [id number]     Agent: Please be advised that RX refills may take up to 3 business days. We ask that you follow-up with your pharmacy.

## 2020-01-02 ENCOUNTER — Other Ambulatory Visit: Payer: Self-pay

## 2020-01-02 ENCOUNTER — Ambulatory Visit: Payer: BC Managed Care – PPO | Admitting: Family

## 2020-01-02 ENCOUNTER — Ambulatory Visit (INDEPENDENT_AMBULATORY_CARE_PROVIDER_SITE_OTHER): Payer: BC Managed Care – PPO | Admitting: Family

## 2020-01-02 ENCOUNTER — Encounter: Payer: Self-pay | Admitting: Family

## 2020-01-02 VITALS — BP 132/66 | Temp 98.0°F | Resp 16 | Ht 69.0 in | Wt 238.0 lb

## 2020-01-02 DIAGNOSIS — E538 Deficiency of other specified B group vitamins: Secondary | ICD-10-CM | POA: Diagnosis not present

## 2020-01-02 DIAGNOSIS — E039 Hypothyroidism, unspecified: Secondary | ICD-10-CM | POA: Diagnosis not present

## 2020-01-02 DIAGNOSIS — M1712 Unilateral primary osteoarthritis, left knee: Secondary | ICD-10-CM | POA: Diagnosis not present

## 2020-01-02 DIAGNOSIS — I10 Essential (primary) hypertension: Secondary | ICD-10-CM | POA: Diagnosis not present

## 2020-01-02 LAB — BASIC METABOLIC PANEL
BUN: 12 mg/dL (ref 6–23)
CO2: 29 mEq/L (ref 19–32)
Calcium: 9.4 mg/dL (ref 8.4–10.5)
Chloride: 100 mEq/L (ref 96–112)
Creatinine, Ser: 0.67 mg/dL (ref 0.40–1.20)
GFR: 93.96 mL/min (ref >60.00)
Glucose, Bld: 91 mg/dL (ref 70–99)
Potassium: 4.1 mEq/L (ref 3.5–5.1)
Sodium: 134 mEq/L — ABNORMAL LOW (ref 135–145)

## 2020-01-02 LAB — VITAMIN B12: Vitamin B-12: 244 pg/mL (ref 211–911)

## 2020-01-02 LAB — TSH: TSH: 1.27 u[IU]/mL (ref 0.35–4.50)

## 2020-01-02 NOTE — Progress Notes (Signed)
Subjective:    Patient ID: Christine [name], female    DOB: 1970/12/26, 49 y.o.   MRN: [id number]  HPI  Patient is a 49 yr old female who presents today for follow up of her hypertension.  HTN- bp medications include amlodipine 7.5mg  once daily.  BP Readings from Last 3 Encounters:  01/02/20 132/66  07/02/19 (!) 154/91  04/20/19 134/69   Asthma/allergies- maintained on advair, amlodipine. Improved since she restarted the advair.   GERD- maintained on protonix 40mg . Reports symptoms are stable.    Depression/Fibromyalgia- maintained on cymbalta. Mom died in 2023/08/10 from covid.  This has been hard for her.  She feels like her depression is not quite as well controlled but that it is mostly situational this point.  She has been worried about her 13-year-old son who has been struggling with online learning and lack of socialization.  She does have some fibropain as well.  Wt Readings from Last 3 Encounters:  01/02/20 238 lb (108 kg)  04/20/19 232 lb (105.2 kg)  03/30/19 231 lb (104.8 kg)   B12 deficiency- sometimes she forgets.  She typically takes her B12 injection every 45 days.  She is seeing ortho for bilateral knee pain. She is seeing [facility] and had a steroid shot in the left knee.   Review of Systems See HPI  Past Medical History:  Diagnosis Date  . Allergy   . Anemia    stated h/o pernicious anemia  . Arthritis    osteoarthritis  . Asthma    Acute asthmatic bronchitis 2011 while pregnant  . Carpal tunnel syndrome    both wrist  . Depression   . Diverticulitis    recurrent  . Family history of anesthesia complication    nausea and vomiting  . Fibromyalgia   . GERD (gastroesophageal reflux disease)   . Hypothyroidism   . Migraine, unspecified, without mention of intractable migraine without mention of status migrainosus   . Miscarriage 2007  . PONV (postoperative nausea and vomiting)   . Thyroid disease      Social History   Socioeconomic History  .  Marital status: Married    Spouse name: Not on file  . Number of children: 2  . Years of education: Not on file  . Highest education level: Not on file  Occupational History  . Not on file  Tobacco Use  . Smoking status: Former Smoker    Packs/day: 0.50    Years: 3.00    Pack years: 1.50    Types: Cigarettes    Quit date: 01/31/2000    Years since quitting: 19.9  . Smokeless tobacco: Never Used  Substance and Sexual Activity  . Alcohol use: Yes    Alcohol/week: 2.0 standard drinks    Types: 2 Glasses of wine per week    Comment: social  . Drug use: No  . Sexual activity: Yes    Birth control/protection: I.U.D.  Other Topics Concern  . Not on file  Social History Narrative   Married, 2 daughters (75 and 82) son age 32   Occupation: Housewife   Daily Caffeine Use   Patient does not get regular exercise.   Social Determinants of Health   Financial Resource Strain:   . Difficulty of Paying Living Expenses:   Food Insecurity:   . Worried About Charity fundraiser in the Last Year:   . Arboriculturist in the Last Year:   Transportation Needs:   . Lack of Transportation (  Medical):   Marland Kitchen Lack of Transportation (Non-Medical):   Physical Activity:   . Days of Exercise per Week:   . Minutes of Exercise per Session:   Stress:   . Feeling of Stress :   Social Connections:   . Frequency of Communication with Friends and Family:   . Frequency of Social Gatherings with Friends and Family:   . Attends Religious Services:   . Active Member of Clubs or Organizations:   . Attends Archivist Meetings:   Marland Kitchen Marital Status:   Intimate Partner Violence:   . Fear of Current or Ex-Partner:   . Emotionally Abused:   Marland Kitchen Physically Abused:   . Sexually Abused:     Past Surgical History:  Procedure Laterality Date  . CHOLECYSTECTOMY N/A 03/25/2014   Procedure: LAPAROSCOPIC CHOLECYSTECTOMY;  Surgeon: Ralene Ok, MD;  Location: [facility];  Service: General;  Laterality: N/A;  .  DILATION AND CURETTAGE OF UTERUS     miscarriage  . LAPAROSCOPY  5/94   x 2  . SIGMOIDECTOMY     Hand-assisted laparoscopic sigmoidectomy with splenic flexure takedown.    Family History  Problem Relation Age of Onset  . Barrett's esophagus Mother   . Coronary artery disease Mother   . Congestive Heart Failure Mother   . Hypertension Mother   . Fibromyalgia Mother   . Chronic fatigue Mother   . Anemia Mother   . Irritable bowel syndrome Mother   . Atrial fibrillation Mother   . Colon polyps Father   . Diabetes Father   . Hypertension Father   . Heart disease Father   . Ehlers-Danlos syndrome Daughter   . Anemia Daughter   . Other Daughter        Postural orthostatic hypotension  . Irritable bowel syndrome Daughter   . Alcohol abuse Other   . Arthritis Other   . Hyperlipidemia Other   . Hypertension Other   . Stroke Other   . Coronary artery disease Other   . Clotting disorder Other        Government social research officer  . Crohn's disease Cousin   . Diabetes Paternal Aunt   . Diabetes Paternal Grandfather   . Colon cancer Neg Hx     Allergies  Allergen Reactions  . Hydrocodone Nausea Only and Other (See Comments)    Hallucination   . Dilaudid [Hydromorphone Hcl]   . Latex Rash  . Penicillins Rash and Other (See Comments)    Childhood allergy    Current Outpatient Medications on File Prior to Visit  Medication Sig Dispense Refill  . albuterol (VENTOLIN HFA) 108 (90 Base) MCG/ACT inhaler INHALE 2 PUFFS INTO THE LUNGS EVERY 6 HOURS AS NEEDED FOR WHEEZING OR SHORTNESS OF BREATH 6.7 g 1  . amLODipine (NORVASC) 5 MG tablet Take 1.5 tablets (7.5 mg total) by mouth daily. 135 tablet 1  . cyanocobalamin (,VITAMIN B-12,) 1000 MCG/ML injection Inject 1 mL (1,000 mcg total) into the skin every 30 (thirty) days. 3 mL 4  . DULoxetine (CYMBALTA) 60 MG capsule Take 1 capsule (60 mg total) by mouth daily. 90 capsule 0  . fluticasone (FLONASE) 50 MCG/ACT nasal spray Place 2 sprays into  both nostrils daily. 16 g 5  . Fluticasone-Salmeterol (ADVAIR DISKUS) 250-50 MCG/DOSE AEPB Inhale 1 puff into the lungs 2 (two) times daily. 1 each 3  . ibuprofen (ADVIL,MOTRIN) 800 MG tablet Take 1 tablet (800 mg total) by mouth 3 (three) times daily. (Patient taking differently: Take 800 mg by  mouth 3 (three) times daily as needed. ) 21 tablet 0  . levocetirizine (XYZAL) 5 MG tablet Take 1 tablet (5 mg total) by mouth every evening. 90 tablet 0  . Levonorgestrel (MIRENA, 52 MG, IU) by Intrauterine route.    Marland Kitchen levothyroxine (SYNTHROID) 125 MCG tablet Take 1 tablet (125 mcg total) by mouth daily before breakfast. 90 tablet 1  . montelukast (SINGULAIR) 10 MG tablet TAKE 1 TABLET(10 MG) BY MOUTH AT BEDTIME 90 tablet 0  . OVER THE COUNTER MEDICATION Take 2 capsules by mouth 2 (two) times daily. TUMERIC    . pantoprazole (PROTONIX) 40 MG tablet Take 1 tablet (40 mg total) by mouth daily. 30 tablet 5  . Syringe/Needle, Disp, (SYRINGE 3CC/23GX1") 23G X 1" 3 ML MISC Use as directed. 50 each 0   No current facility-administered medications on file prior to visit.    BP 132/66 (BP Location: Right Arm, Patient Position: Sitting, Cuff Size: Large)   Temp 98 F (36.7 C) (Temporal)   Resp 16   Ht 5\' 9"  (1.753 m)   Wt 238 lb (108 kg)   SpO2 99%   BMI 35.15 kg/m       Objective:   Physical Exam Constitutional:      Appearance: She is well-developed.  Cardiovascular:     Rate and Rhythm: Normal rate and regular rhythm.     Heart sounds: Normal heart sounds. No murmur.  Pulmonary:     Effort: Pulmonary effort is normal. No respiratory distress.     Breath sounds: Normal breath sounds. No wheezing.  Psychiatric:        Behavior: Behavior normal.        Thought Content: Thought content normal.        Judgment: Judgment normal.           Assessment & Plan:  Hypertension-blood pressure stable on current dose of amlodipine.  Continue same.  Will obtain basic metabolic  panel.  Depression-fair control, worsened by recent loss of her mother.  We will continue Cymbalta.  I advised the patient to let me know if her symptoms worsen or if they do not continue to improve in the coming months.  Fibromyalgia-fair control of pain.  Continue Cymbalta.  GERD-stable on proton pump inhibitor.  Continue same.  B12 deficiency-continue B12 injections, obtain follow-up B12 level.  Asthma-symptoms are improved since she restarted Advair.  Continue Advair and as needed albuterol.  This visit occurred during the SARS-CoV-2 public health emergency.  Safety protocols were in place, including screening questions prior to the visit, additional usage of staff PPE, and extensive cleaning of exam room while observing appropriate contact time as indicated for disinfecting solutions.

## 2020-01-03 ENCOUNTER — Encounter: Payer: Self-pay | Admitting: Family

## 2020-01-03 MED ORDER — CYANOCOBALAMIN 1000 MCG/ML IJ SOLN: 1000.0000 ug | INTRAMUSCULAR | 4 refills | Status: DC

## 2020-01-09 DIAGNOSIS — M1712 Unilateral primary osteoarthritis, left knee: Secondary | ICD-10-CM | POA: Diagnosis not present

## 2020-01-24 ENCOUNTER — Other Ambulatory Visit: Payer: Self-pay

## 2020-01-24 MED ORDER — PANTOPRAZOLE SODIUM 40 MG PO TBEC: 40.0000 mg | DELAYED_RELEASE_TABLET | Freq: Every day | ORAL | 5 refills | Status: DC

## 2020-01-26 ENCOUNTER — Encounter: Payer: Self-pay | Admitting: Family

## 2020-01-28 MED ORDER — LEVOTHYROXINE SODIUM 125 MCG PO TABS: 125.0000 ug | ORAL_TABLET | Freq: Every day | ORAL | 1 refills | Status: DC

## 2020-01-28 MED ORDER — DULOXETINE HCL 60 MG PO CPEP: 60.0000 mg | ORAL_CAPSULE | Freq: Every day | ORAL | 1 refills | Status: DC

## 2020-01-28 MED ORDER — AMLODIPINE BESYLATE 5 MG PO TABS: 7.5000 mg | ORAL_TABLET | Freq: Every day | ORAL | 1 refills | Status: DC

## 2020-02-21 ENCOUNTER — Telehealth: Payer: Self-pay | Admitting: *Deleted

## 2020-02-21 MED ORDER — OMEPRAZOLE 40 MG PO CPDR: 40.0000 mg | DELAYED_RELEASE_CAPSULE | Freq: Every day | ORAL | 3 refills | Status: DC

## 2020-02-21 NOTE — Telephone Encounter (Addendum)
Received fax from [facility] stating Pantoprazole is not covered by pt's insurance. Preferred alternatives are:  Omeprazole, Lansoprazole, Rabeprazole, Esomeprazole. You may call [phone] if preferred alternatives are not appropriate,  ID # [id number].

## 2020-02-21 NOTE — Telephone Encounter (Signed)
Please advise pt that protonix is not covered. I sent omeprazole for her to start instead.

## 2020-02-22 NOTE — Telephone Encounter (Signed)
Called [facility] no answer left detail message with information and advised patient to call back if any questions

## 2020-03-12 DIAGNOSIS — M1712 Unilateral primary osteoarthritis, left knee: Secondary | ICD-10-CM | POA: Diagnosis not present

## 2020-03-24 ENCOUNTER — Other Ambulatory Visit: Payer: Self-pay | Admitting: Family

## 2020-04-08 ENCOUNTER — Ambulatory Visit (INDEPENDENT_AMBULATORY_CARE_PROVIDER_SITE_OTHER): Payer: BC Managed Care – PPO | Admitting: Family

## 2020-04-08 ENCOUNTER — Encounter: Payer: Self-pay | Admitting: Family

## 2020-04-08 ENCOUNTER — Telehealth: Payer: Self-pay | Admitting: Family

## 2020-04-08 ENCOUNTER — Other Ambulatory Visit: Payer: Self-pay

## 2020-04-08 VITALS — BP 136/65 | HR 85 | Temp 98.4°F | Resp 16 | Ht 69.0 in | Wt 228.0 lb

## 2020-04-08 DIAGNOSIS — F329 Major depressive disorder, single episode, unspecified: Secondary | ICD-10-CM | POA: Diagnosis not present

## 2020-04-08 DIAGNOSIS — M1712 Unilateral primary osteoarthritis, left knee: Secondary | ICD-10-CM | POA: Diagnosis not present

## 2020-04-08 DIAGNOSIS — E039 Hypothyroidism, unspecified: Secondary | ICD-10-CM

## 2020-04-08 DIAGNOSIS — E538 Deficiency of other specified B group vitamins: Secondary | ICD-10-CM | POA: Diagnosis not present

## 2020-04-08 DIAGNOSIS — Z0000 Encounter for general adult medical examination without abnormal findings: Secondary | ICD-10-CM | POA: Diagnosis not present

## 2020-04-08 DIAGNOSIS — T7840XD Allergy, unspecified, subsequent encounter: Secondary | ICD-10-CM

## 2020-04-08 DIAGNOSIS — I10 Essential (primary) hypertension: Secondary | ICD-10-CM | POA: Diagnosis not present

## 2020-04-08 DIAGNOSIS — J45909 Unspecified asthma, uncomplicated: Secondary | ICD-10-CM

## 2020-04-08 DIAGNOSIS — F32A Depression, unspecified: Secondary | ICD-10-CM

## 2020-04-08 NOTE — Telephone Encounter (Signed)
Please call [facility] and request copy of mammogram.  

## 2020-04-08 NOTE — Patient Instructions (Addendum)
Please complete lab work prior to leaving. Keep up the good work with weight loss.    Preventive Care 31-48 Years Old, Female Preventive care refers to visits with your health care provider and lifestyle choices that can promote health and wellness. This includes:  A yearly physical exam. This may also be called an annual well check.  Regular dental visits and eye exams.  Immunizations.  Screening for certain conditions.  Healthy lifestyle choices, such as eating a healthy diet, getting regular exercise, not using drugs or products that contain nicotine and tobacco, and limiting alcohol use. What can I expect for my preventive care visit? Physical exam Your health care provider will check your:  Height and weight. This may be used to calculate body mass index (BMI), which tells if you are at a healthy weight.  Heart rate and blood pressure.  Skin for abnormal spots. Counseling Your health care provider may ask you questions about your:  Alcohol, tobacco, and drug use.  Emotional well-being.  Home and relationship well-being.  Sexual activity.  Eating habits.  Work and work Statistician.  Method of birth control.  Menstrual cycle.  Pregnancy history. What immunizations do I need?  Influenza (flu) vaccine  This is recommended every year. Tetanus, diphtheria, and pertussis (Tdap) vaccine  You may need a Td booster every 10 years. Varicella (chickenpox) vaccine  You may need this if you have not been vaccinated. Zoster (shingles) vaccine  You may need this after age 43. Measles, mumps, and rubella (MMR) vaccine  You may need at least one dose of MMR if you were born in 1957 or later. You may also need a second dose. Pneumococcal conjugate (PCV13) vaccine  You may need this if you have certain conditions and were not previously vaccinated. Pneumococcal polysaccharide (PPSV23) vaccine  You may need one or two doses if you smoke cigarettes or if you have  certain conditions. Meningococcal conjugate (MenACWY) vaccine  You may need this if you have certain conditions. Hepatitis A vaccine  You may need this if you have certain conditions or if you travel or work in places where you may be exposed to hepatitis A. Hepatitis B vaccine  You may need this if you have certain conditions or if you travel or work in places where you may be exposed to hepatitis B. Haemophilus influenzae type b (Hib) vaccine  You may need this if you have certain conditions. Human papillomavirus (HPV) vaccine  If recommended by your health care provider, you may need three doses over 6 months. You may receive vaccines as individual doses or as more than one vaccine together in one shot (combination vaccines). Talk with your health care provider about the risks and benefits of combination vaccines. What tests do I need? Blood tests  Lipid and cholesterol levels. These may be checked every 5 years, or more frequently if you are over 76 years old.  Hepatitis C test.  Hepatitis B test. Screening  Lung cancer screening. You may have this screening every year starting at age 80 if you have a 30-pack-year history of smoking and currently smoke or have quit within the past 15 years.  Colorectal cancer screening. All adults should have this screening starting at age 16 and continuing until age 57. Your health care provider may recommend screening at age 22 if you are at increased risk. You will have tests every 1-10 years, depending on your results and the type of screening test.  Diabetes screening. This is done by  checking your blood sugar (glucose) after you have not eaten for a while (fasting). You may have this done every 1-3 years.  Mammogram. This may be done every 1-2 years. Talk with your health care provider about when you should start having regular mammograms. This may depend on whether you have a family history of breast cancer.  BRCA-related cancer  screening. This may be done if you have a family history of breast, ovarian, tubal, or peritoneal cancers.  Pelvic exam and Pap test. This may be done every 3 years starting at age 37. Starting at age 44, this may be done every 5 years if you have a Pap test in combination with an HPV test. Other tests  Sexually transmitted disease (STD) testing.  Bone density scan. This is done to screen for osteoporosis. You may have this scan if you are at high risk for osteoporosis. Follow these instructions at home: Eating and drinking  Eat a diet that includes fresh fruits and vegetables, whole grains, lean protein, and low-fat dairy.  Take vitamin and mineral supplements as recommended by your health care provider.  Do not drink alcohol if: ? Your health care provider tells you not to drink. ? You are pregnant, may be pregnant, or are planning to become pregnant.  If you drink alcohol: ? Limit how much you have to 0-1 drink a day. ? Be aware of how much alcohol is in your drink. In the [place] one drink equals one 12 oz bottle of beer (355 mL), one 5 oz glass of wine (148 mL), or one 1 oz glass of hard liquor (44 mL). Lifestyle  Take daily care of your teeth and gums.  Stay active. Exercise for at least 30 minutes on 5 or more days each week.  Do not use any products that contain nicotine or tobacco, such as cigarettes, e-cigarettes, and chewing tobacco. If you need help quitting, ask your health care provider.  If you are sexually active, practice safe sex. Use a condom or other form of birth control (contraception) in order to prevent pregnancy and STIs (sexually transmitted infections).  If told by your health care provider, take low-dose aspirin daily starting at age 10. What's next?  Visit your health care provider once a year for a well check visit.  Ask your health care provider how often you should have your eyes and teeth checked.  Stay up to date on all vaccines. This  information is not intended to replace advice given to you by your health care provider. Make sure you discuss any questions you have with your health care provider. Document Revised: 03/30/2018 Document Reviewed: 03/30/2018 Elsevier Patient Education  2020 [employer].

## 2020-04-08 NOTE — Progress Notes (Signed)
Subjective:    Patient ID: Christine [name], female    DOB: 1970/11/13, 49 y.o.   MRN: [id number]  HPI  Patient presents today for complete physical.  Immunizations: pfizer vaccine is complete, flu shot today.  Diet: improving Wt Readings from Last 3 Encounters:  04/08/20 228 lb (103.4 kg)  01/02/20 238 lb (108 kg)  04/20/19 232 lb (105.2 kg)  Exercise:  some Pap Smear:  2/19 Mammogram: ? 2019 Vision: up to date Dental: up to date  Asthma- reports that it comes and goes.  Worse since she had covid.  HTN-  BP Readings from Last 3 Encounters:  04/08/20 136/65  01/02/20 132/66  07/02/19 (!) 154/91   Hypothyroid- maintained on synthroid.  Lab Results  Component Value Date   TSH 1.27 01/02/2020   Allergies- xyzal daily, singulair prn. Continues flonase.  Considering allergy shots.   b12 deficiency- admits to missing some injections.   Review of Systems  Constitutional: Negative for unexpected weight change.  HENT: Negative for hearing loss and rhinorrhea.   Eyes: Negative for visual disturbance.  Respiratory: Negative for cough and shortness of breath.   Cardiovascular: Negative for chest pain.  Gastrointestinal: Negative for constipation and diarrhea.  Genitourinary: Negative for dysuria, frequency and menstrual problem.  Musculoskeletal: Positive for arthralgias (left knee pain- sees ortho) and myalgias (intermittent).  Skin: Negative for rash.  Neurological: Positive for headaches (once weekly, uses tylenol prn).  Hematological: Negative for adenopathy.  Psychiatric/Behavioral:       Reports depression is under control Anxiety- comes and goes- related to things going on   Past Medical History:  Diagnosis Date  . Allergy   . Anemia    stated h/o pernicious anemia  . Arthritis    osteoarthritis  . Asthma    Acute asthmatic bronchitis 2011 while pregnant  . Carpal tunnel syndrome    both wrist  . Depression   . Diverticulitis    recurrent  . Family  history of anesthesia complication    nausea and vomiting  . Fibromyalgia   . GERD (gastroesophageal reflux disease)   . Hypothyroidism   . Migraine, unspecified, without mention of intractable migraine without mention of status migrainosus   . Miscarriage 2007  . PONV (postoperative nausea and vomiting)   . Thyroid disease      Social History   Socioeconomic History  . Marital status: Married    Spouse name: Not on file  . Number of children: 2  . Years of education: Not on file  . Highest education level: Not on file  Occupational History  . Not on file  Tobacco Use  . Smoking status: Former Smoker    Packs/day: 0.50    Years: 3.00    Pack years: 1.50    Types: Cigarettes    Quit date: 01/31/2000    Years since quitting: 20.2  . Smokeless tobacco: Never Used  Substance and Sexual Activity  . Alcohol use: Yes    Alcohol/week: 2.0 standard drinks    Types: 2 Glasses of wine per week    Comment: social  . Drug use: No  . Sexual activity: Yes    Birth control/protection: I.U.D.  Other Topics Concern  . Not on file  Social History Narrative   Married, 2 daughters (40 and 56) son age 82   Occupation: Housewife   Daily Caffeine Use   Patient does not get regular exercise.   Social Determinants of Health   Financial Resource Strain:   .  Difficulty of Paying Living Expenses: Not on file  Food Insecurity:   . Worried About Charity fundraiser in the Last Year: Not on file  . Ran Out of Food in the Last Year: Not on file  Transportation Needs:   . Lack of Transportation (Medical): Not on file  . Lack of Transportation (Non-Medical): Not on file  Physical Activity:   . Days of Exercise per Week: Not on file  . Minutes of Exercise per Session: Not on file  Stress:   . Feeling of Stress : Not on file  Social Connections:   . Frequency of Communication with Friends and Family: Not on file  . Frequency of Social Gatherings with Friends and Family: Not on file  . Attends  Religious Services: Not on file  . Active Member of Clubs or Organizations: Not on file  . Attends Archivist Meetings: Not on file  . Marital Status: Not on file  Intimate Partner Violence:   . Fear of Current or Ex-Partner: Not on file  . Emotionally Abused: Not on file  . Physically Abused: Not on file  . Sexually Abused: Not on file    Past Surgical History:  Procedure Laterality Date  . CHOLECYSTECTOMY N/A 03/25/2014   Procedure: LAPAROSCOPIC CHOLECYSTECTOMY;  Surgeon: Ralene Ok, MD;  Location: [place];  Service: General;  Laterality: N/A;  . DILATION AND CURETTAGE OF UTERUS     miscarriage  . LAPAROSCOPY  5/94   x 2  . SIGMOIDECTOMY     Hand-assisted laparoscopic sigmoidectomy with splenic flexure takedown.    Family History  Problem Relation Age of Onset  . Barrett's esophagus Mother   . Coronary artery disease Mother   . Congestive Heart Failure Mother   . Hypertension Mother   . Fibromyalgia Mother   . Chronic fatigue Mother   . Anemia Mother   . Irritable bowel syndrome Mother   . Atrial fibrillation Mother   . Colon polyps Father   . Diabetes Father   . Hypertension Father   . Heart disease Father   . Ehlers-Danlos syndrome Daughter   . Anemia Daughter   . Other Daughter        Postural orthostatic hypotension  . Irritable bowel syndrome Daughter   . Alcohol abuse Other   . Arthritis Other   . Hyperlipidemia Other   . Hypertension Other   . Stroke Other   . Coronary artery disease Other   . Clotting disorder Other        Government social research officer  . Crohn's disease Cousin   . Diabetes Paternal Aunt   . Diabetes Paternal Grandfather   . Colon cancer Neg Hx     Allergies  Allergen Reactions  . Hydrocodone Nausea Only and Other (See Comments)    Hallucination   . Dilaudid [Hydromorphone Hcl]   . Latex Rash  . Penicillins Rash and Other (See Comments)    Childhood allergy    Current Outpatient Medications on File Prior to Visit    Medication Sig Dispense Refill  . albuterol (VENTOLIN HFA) 108 (90 Base) MCG/ACT inhaler INHALE 2 PUFFS INTO THE LUNGS EVERY 6 HOURS AS NEEDED FOR WHEEZING OR SHORTNESS OF BREATH 6.7 g 1  . amLODipine (NORVASC) 5 MG tablet Take 1.5 tablets (7.5 mg total) by mouth daily. 135 tablet 1  . cyanocobalamin (,VITAMIN B-12,) 1000 MCG/ML injection Inject 1 mL (1,000 mcg total) into the skin every 30 (thirty) days. 3 mL 4  . DULoxetine (CYMBALTA)  60 MG capsule Take 1 capsule (60 mg total) by mouth daily. 90 capsule 1  . fluticasone (FLONASE) 50 MCG/ACT nasal spray Place 2 sprays into both nostrils daily. 16 g 5  . Fluticasone-Salmeterol (ADVAIR DISKUS) 250-50 MCG/DOSE AEPB Inhale 1 puff into the lungs 2 (two) times daily. 1 each 3  . ibuprofen (ADVIL,MOTRIN) 800 MG tablet Take 1 tablet (800 mg total) by mouth 3 (three) times daily. (Patient taking differently: Take 800 mg by mouth 3 (three) times daily as needed. ) 21 tablet 0  . levocetirizine (XYZAL) 5 MG tablet Take 1 tablet (5 mg total) by mouth every evening. 90 tablet 0  . Levonorgestrel (MIRENA, 52 MG, IU) by Intrauterine route.    Marland Kitchen levothyroxine (SYNTHROID) 125 MCG tablet Take 1 tablet (125 mcg total) by mouth daily before breakfast. 90 tablet 1  . montelukast (SINGULAIR) 10 MG tablet TAKE 1 TABLET(10 MG) BY MOUTH AT BEDTIME 90 tablet 1  . omeprazole (PRILOSEC) 40 MG capsule Take 1 capsule (40 mg total) by mouth daily. 30 capsule 3  . OVER THE COUNTER MEDICATION Take 2 capsules by mouth 2 (two) times daily. TUMERIC    . pantoprazole (PROTONIX) 40 MG tablet Take 40 mg by mouth daily.    . Syringe/Needle, Disp, (SYRINGE 3CC/23GX1") 23G X 1" 3 ML MISC Use as directed. 50 each 0   No current facility-administered medications on file prior to visit.    BP 136/65 (BP Location: Right Arm, Patient Position: Sitting, Cuff Size: Large)   Pulse 85   Temp 98.4 F (36.9 C) (Oral)   Resp 16   Ht 5\' 9"  (1.753 m)   Wt 228 lb (103.4 kg)   SpO2 (!) 80%    BMI 33.67 kg/m        Objective:   Physical Exam  Physical Exam  Constitutional: She is oriented to person, place, and time. She appears well-developed and well-nourished. No distress.  HENT:  Head: Normocephalic and atraumatic.  Right Ear: Tympanic membrane and ear canal normal.  Left Ear: Tympanic membrane and ear canal normal.  Mouth/Throat: Oropharynx is clear and moist.  Eyes: Pupils are equal, round, and reactive to light. No scleral icterus.  Neck: Normal range of motion. No thyromegaly present.  Cardiovascular: Normal rate and regular rhythm.   No murmur heard. Pulmonary/Chest: Effort normal and breath sounds normal. No respiratory distress. He has no wheezes. She has no rales. She exhibits no tenderness.  Abdominal: Soft. Bowel sounds are normal. She exhibits no distension and no mass. There is no tenderness. There is no rebound and no guarding.  Musculoskeletal: She exhibits no edema.  Lymphadenopathy:    She has no cervical adenopathy.  Neurological: She is alert and oriented to person, place, and time. She has normal patellar reflexes. She exhibits normal muscle tone. Coordination normal.  Skin: Skin is warm and dry.  Psychiatric: She has a normal mood and affect. Her behavior is normal. Judgment and thought content normal.  Breasts: Examined lying Right: Without masses, retractions, discharge or axillary adenopathy.  Left: Without masses, retractions, discharge or axillary adenopathy.  Pelvic: deferred         Assessment & Plan:  Preventative care- discussed healthy diet, exercise and weight loss. Commended her on her most recent weight loss. She will see GYN to update pap and mammogram. Flu shot today.  HTN- bp stable on amlodipine. Continue same.   GERD- stable on protonix. Had to switch briefly to omeprazole for insurance reasons, but found a  coupon and she has restarted protonix which works better for her.  Asthma- has worsened recently. She is only using  advair once daily. Advised her to use bid. Continue singulair  Seasonal allergies- reports that they have been worse this spring. She is interested in returning to [facility] to initiate allergy shots.  b12 deficiency- reinforced the importance of taking b12 injection monthly.   Depression- stable. Monitor.  This visit occurred during the SARS-CoV-2 public health emergency.  Safety protocols were in place, including screening questions prior to the visit, additional usage of staff PPE, and extensive cleaning of exam room while observing appropriate contact time as indicated for disinfecting solutions.        Assessment & Plan:

## 2020-04-09 ENCOUNTER — Encounter: Payer: Self-pay | Admitting: Family

## 2020-04-09 LAB — COMPREHENSIVE METABOLIC PANEL
AG Ratio: 1.5 (calc) (ref 1.0–2.5)
ALT: 28 U/L (ref 6–29)
AST: 21 U/L (ref 10–35)
Albumin: 4.3 g/dL (ref 3.6–5.1)
Alkaline phosphatase (APISO): 79 U/L (ref 31–125)
BUN: 10 mg/dL (ref 7–25)
CO2: 28 mmol/L (ref 20–32)
Calcium: 9.6 mg/dL (ref 8.6–10.2)
Chloride: 101 mmol/L (ref 98–110)
Creat: 0.71 mg/dL (ref 0.50–1.10)
Globulin: 2.9 g/dL (calc) (ref 1.9–3.7)
Glucose, Bld: 100 mg/dL — ABNORMAL HIGH (ref 65–99)
Potassium: 4.4 mmol/L (ref 3.5–5.3)
Sodium: 136 mmol/L (ref 135–146)
Total Bilirubin: 0.5 mg/dL (ref 0.2–1.2)
Total Protein: 7.2 g/dL (ref 6.1–8.1)

## 2020-04-09 LAB — CBC WITH DIFFERENTIAL/PLATELET
Absolute Monocytes: 574 cells/uL (ref 200–950)
Basophils Absolute: 84 cells/uL (ref 0–200)
Basophils Relative: 1.2 %
Eosinophils Absolute: 224 cells/uL (ref 15–500)
Eosinophils Relative: 3.2 %
HCT: 40.4 % (ref 35.0–45.0)
Hemoglobin: 13.5 g/dL (ref 11.7–15.5)
Lymphs Abs: 1813 cells/uL (ref 850–3900)
MCH: 29.7 pg (ref 27.0–33.0)
MCHC: 33.4 g/dL (ref 32.0–36.0)
MCV: 89 fL (ref 80.0–100.0)
MPV: 10.5 fL (ref 7.5–12.5)
Monocytes Relative: 8.2 %
Neutro Abs: 4305 cells/uL (ref 1500–7800)
Neutrophils Relative %: 61.5 %
Platelets: 306 10*3/uL (ref 140–400)
RBC: 4.54 10*6/uL (ref 3.80–5.10)
RDW: 13.1 % (ref 11.0–15.0)
Total Lymphocyte: 25.9 %
WBC: 7 10*3/uL (ref 3.8–10.8)

## 2020-04-09 LAB — LIPID PANEL
Cholesterol: 179 mg/dL (ref <200)
HDL: 34 mg/dL — ABNORMAL LOW (ref >=50)
LDL Cholesterol (Calc): 107 mg/dL (calc) — ABNORMAL HIGH
Non-HDL Cholesterol (Calc): 145 mg/dL (calc) — ABNORMAL HIGH (ref <130)
Total CHOL/HDL Ratio: 5.3 (calc) — ABNORMAL HIGH (ref <5.0)
Triglycerides: 267 mg/dL — ABNORMAL HIGH (ref <150)

## 2020-04-09 NOTE — Telephone Encounter (Signed)
Records release form faxed to [facility]

## 2020-04-10 MED ORDER — LEVOCETIRIZINE DIHYDROCHLORIDE 5 MG PO TABS: 5.0000 mg | ORAL_TABLET | Freq: Every evening | ORAL | 3 refills | Status: DC

## 2020-05-05 NOTE — Progress Notes (Signed)
New Patient Note  RE: Christine [name] MRN: [id number] DOB: 1970/09/12 Date of Office Visit: 05/06/2020  Referring provider: Debbrah Alar, NP Primary care provider: Debbrah Alar, NP  Chief Complaint: Allergies  History of Present Illness: I had the pleasure of seeing Christine [name] for initial evaluation at the [facility] on 05/06/2020. She is a 49 y.o. female, who is referred here by Debbrah Alar, NP for the evaluation of seasonal allergies and asthma.  Rhinitis: Interested in allergy immunotherapy. She reports symptoms of sneezing, coughing, nasal congestion, PND, rhinorrhea, itchy/watery eyes. Symptoms have been going on for 20 years but worse the past 10 years. The symptoms are present all year around with worsening in spring. Other triggers include exposure to cats. Anosmia: no. Headache: yes. She has used Flonase, Singulair, Xyzal, Claritin, Allegra with some improvement in symptoms. OTC eye drops do not help but steroid eye drops do. Sinus infections: yes multiple times per year. Previous work up includes: patient had skin testing over 5 years ago was positive to trees and mold per patient report. No previous allergy injections.  Previous ENT evaluation: no. Previous sinus imaging: no. History of nasal polyps: no. Last eye exam: within the past year. History of reflux: yes and takes Protonix daily.  Asthma: She reports symptoms of chest tightness, shortness of breath, coughing, wheezing for 10 years. Current medications include albuterol prn and Advair 257mcg 1 puff twice a day x 1 month which help. She reports not using aerochamber with inhalers. She tried the following inhalers: no. Main triggers are allergies. In the last month, frequency of symptoms: daily coughing. Frequency of nocturnal symptoms: 0x/month. Frequency of SABA use: <1x/week. Interference with physical activity: no. Sleep is undisturbed. In the last 12 months, emergency  room visits/urgent care visits/doctor office visits or hospitalizations due to respiratory issues: 0. In the last 12 months, oral steroids courses: 0. Lifetime history of hospitalization for respiratory issues: no. Prior intubations: no. History of pneumonia: no. She was evaluated by allergist in the past. Smoking exposure: quit in 2001. Up to date with flu vaccine: yes. Up to date with COVID-19 vaccine: yes.   Patient had Covid in November 2020.   Assessment and Plan: Christine [name] is a 49 y.o. female with: Other allergic rhinitis Worsening perennial rhinoconjunctivitis symptoms for the last 10 years.  Tried Flonase, Singulair, Xyzal, Claritin and Allegra with some benefit.  During the springtime required steroid eyedrops to alleviate symptoms.  No prior allergy immunotherapy.  Today's skin testing showed: Positive to grass, weed, trees, cockroaches, mold, cat, dust mites.   Start environmental control measures as below.  May use over the counter antihistamines such as Zyrtec (cetirizine), Claritin (loratadine), Allegra (fexofenadine), or Xyzal (levocetirizine) daily as needed. May take twice a day if needed.  Continue Singulair (montelukast) 10mg  daily at night. Start dymista (fluticasone + azelastine nasal spray combination) 1 spray per nostril twice a day. This replaces Flonase (fluticasone) for now. If it's not covered let us know.  May use olopatadine eye drops 0.7% once a day as needed for itchy/watery eyes. Had a detailed discussion with patient/family that clinical history is suggestive of allergic rhinitis, and may benefit from allergy immunotherapy (AIT). Discussed in detail regarding the dosing, schedule, side effects (mild to moderate local allergic reaction and rarely systemic allergic reactions including anaphylaxis), and benefits (significant improvement in nasal symptoms, seasonal flares of asthma) of immunotherapy with the patient. There is significant time commitment involved with  allergy shots, which includes  weekly immunotherapy injections for first 9-12 months and then biweekly to monthly injections for 3-5 years. Consent was signed. Start allergy injections. I have prescribed epinephrine injectable and demonstrated proper use. For mild symptoms you can take over the counter antihistamines such as Benadryl and monitor symptoms closely. If symptoms worsen or if you have severe symptoms including breathing issues, throat closure, significant swelling, whole body hives, severe diarrhea and vomiting, lightheadedness then inject epinephrine and seek immediate medical care afterwards.  Allergic conjunctivitis of both eyes  See assessment and plan as above for allergic rhinitis.  Moderate persistent asthma Diagnosed with asthma about 10 years ago.  Main triggers include allergies.  Restarted Advair 250 mcg 1 puff twice a day and Singulair with good benefit.  Using albuterol now less than once a week. Had Covid-19 in November 2020.  Today's spirometry shows some restriction with no improvement in FEV1 post bronchodilator treatment.  Clinically feeling the same. . Daily controller medication(s): continue Advair 282mcg 1 puff twice a day and rinse mouth after each use. . Continue Singulair (montelukast) 10mg  daily at night. . May use albuterol rescue inhaler 2 puffs every 4 to 6 hours as needed for shortness of breath, chest tightness, coughing, and wheezing. May use albuterol rescue inhaler 2 puffs 5 to 15 minutes prior to strenuous physical activities. Monitor frequency of use.   Drug reaction History of breaking out to penicillin as a child.  Consider penicillin skin testing/drug challenge in the future.  If interested we can schedule drug challenge to penicillin. You must be off antihistamines for 3-5 days before. Must be in good health and not ill. Plan on being in the office for 2-3 hours and must bring in the drug you want to do the oral challenge for - will send in  prescription to pick up a few days before. You must call to schedule an appointment and specify it's for a drug challenge.   More than 90% of patients outgrow their penicillin allergy after 10 years.  Return in about 4 months (around 09/06/2020).  Meds ordered this encounter  Medications  . Azelastine-Fluticasone 137-50 MCG/ACT SUSP    Sig: Place 1 spray into the nose in the morning and at bedtime.    Dispense:  23 g    Refill:  5  . Olopatadine HCl 0.7 % SOLN    Sig: Apply 1 drop to eye daily as needed (itchy/watery eyes).    Dispense:  2.5 mL    Refill:  5  . EPINEPHrine (AUVI-Q) 0.3 mg/0.3 mL IJ SOAJ injection    Sig: Inject 0.3 mg into the muscle as needed for anaphylaxis.    Dispense:  1 each    Refill:  2   Other allergy screening: Food allergy: no  Gluten - causes headaches Medication allergy: yes  penicillin - rash as a child Hymenoptera allergy: no Urticaria: no Eczema:yes History of recurrent infections suggestive of immunodeficency: no  Diagnostics: Spirometry:  Tracings reviewed. Her effort: Good reproducible efforts. FVC: 2.73L FEV1: 2.34L, 70% predicted FEV1/FVC ratio: 86% Interpretation: Spirometry consistent with possible restrictive disease with no improvement in FEV1 post bronchodilator treatment. Clinically feeling the same.  Please see scanned spirometry results for details.  Skin Testing: Environmental allergy panel. Positive test to: grass, weed, trees, cockroaches, mold, cat, dust mites.  Results discussed with patient/family.  Airborne Adult Perc - 05/06/20 0930    Time Antigen Placed 0930    Allergen Manufacturer Lavella Hammock    Location Back    Number  of Test 59    Panel 1 Select    1. Control-Buffer 50% Glycerol Negative    2. Control-Histamine 1 mg/ml 2+    3. Albumin saline Negative    4. [place] 3+    5. [place] --   +/-   6. [place] 2+    7. [place] Blue Negative    8. Meadow Fescue 2+    9. Perennial Rye 2+    10. Sweet Vernal Negative     11. Timothy 2+    12. Cocklebur --   +/-   13. Burweed Marshelder Negative    14. Ragweed, short Negative    15. Ragweed, Giant Negative    16. Plantain,  English 2+    17. Lamb's Quarters Negative    18. Sheep Sorrell --   +/-   19. Rough Pigweed Negative    20. Andreas Ohm, Rough --   +/-   21. Mugwort, Common Negative    22. Ash mix 2+    23. Birch mix 4+    24. Beech American Negative    25. Box, Elder Negative    26. Cedar, red Negative    27. Cottonwood, [place] Negative    28. Elm mix Negative    29. Hickory 2+    30. Maple mix Negative    31. Oak, [place] mix 4+    32. Pecan Pollen --   +/-   33. Pine mix Negative    34. Sycamore Eastern Negative    35. [place], Black Pollen Negative    36. Alternaria alternata Negative    37. Cladosporium Herbarum Negative    38. Aspergillus mix Negative    39. Penicillium mix Negative    40. Bipolaris sorokiniana (Helminthosporium) Negative    41. Drechslera spicifera (Curvularia) Negative    42. Mucor plumbeus Negative    43. Fusarium moniliforme Negative    44. Aureobasidium pullulans (pullulara) Negative    45. Rhizopus oryzae Negative    46. Botrytis cinera Negative    47. Epicoccum nigrum Negative    48. Phoma betae Negative    49. Candida Albicans Negative    50. Trichophyton mentagrophytes Negative    51. Mite, D Farinae  5,000 AU/ml Negative    52. Mite, D Pteronyssinus  5,000 AU/ml Negative    53. Cat Hair 10,000 BAU/ml Negative    54.  Dog Epithelia Negative    55. Mixed Feathers Negative    56. Horse Epithelia Negative    57. Cockroach, German 2+    58. Mouse Negative    59. Tobacco Leaf Negative          Intradermal - 05/06/20 1040    Time Antigen Placed 1040    Allergen Manufacturer Lavella Hammock    Location Arm    Number of Test 9    Intradermal Select    Control Negative    Ragweed mix Negative    Mold 1 Negative    Mold 2 Negative    Mold 3 2+    Mold 4 2+    Cat 2+    Dog Negative    Mite mix 3+            Past Medical History: Patient Active Problem List   Diagnosis Date Noted  . Allergic conjunctivitis of both eyes 05/06/2020  . Drug reaction 05/06/2020  . Moderate persistent asthma 05/06/2020  . B12 deficiency 07/08/2018  . Shortened PR interval 12/27/2017  . Trapezius muscle spasm 10/21/2016  .  Fibromyalgia 05/24/2016  . Chronic fatigue 05/24/2016  . Insomnia 05/24/2016  . Osteoarthritis of both hands 05/24/2016  . DJD (degenerative joint disease), cervical 05/24/2016  . Hyperlipidemia 05/21/2015  . Depression 05/21/2015  . Heel pain 11/19/2014  . Carpal tunnel syndrome 11/19/2014  . Preventative health care 11/19/2014  . Hypothyroidism 06/13/2011  . Other allergic rhinitis 12/19/2007  . GASTROESOPHAGEAL REFLUX DISEASE 09/27/2007   Past Medical History:  Diagnosis Date  . Allergy   . Anemia    stated h/o pernicious anemia  . Arthritis    osteoarthritis  . Asthma    Acute asthmatic bronchitis 2011 while pregnant  . Carpal tunnel syndrome    both wrist  . Depression   . Diverticulitis    recurrent  . Family history of anesthesia complication    nausea and vomiting  . Fibromyalgia   . GERD (gastroesophageal reflux disease)   . Hypothyroidism   . Migraine, unspecified, without mention of intractable migraine without mention of status migrainosus   . Miscarriage 2007  . PONV (postoperative nausea and vomiting)   . Thyroid disease    Past Surgical History: Past Surgical History:  Procedure Laterality Date  . CHOLECYSTECTOMY N/A 03/25/2014   Procedure: LAPAROSCOPIC CHOLECYSTECTOMY;  Surgeon: Ralene Ok, MD;  Location: [facility];  Service: General;  Laterality: N/A;  . DILATION AND CURETTAGE OF UTERUS     miscarriage  . LAPAROSCOPY  5/94   x 2  . SIGMOIDECTOMY     Hand-assisted laparoscopic sigmoidectomy with splenic flexure takedown.   Medication List:  Current Outpatient Medications  Medication Sig Dispense Refill  . albuterol (VENTOLIN HFA)  108 (90 Base) MCG/ACT inhaler INHALE 2 PUFFS INTO THE LUNGS EVERY 6 HOURS AS NEEDED FOR WHEEZING OR SHORTNESS OF BREATH 6.7 g 1  . amLODipine (NORVASC) 5 MG tablet Take 1.5 tablets (7.5 mg total) by mouth daily. 135 tablet 1  . cyanocobalamin (,VITAMIN B-12,) 1000 MCG/ML injection Inject 1 mL (1,000 mcg total) into the skin every 30 (thirty) days. 3 mL 4  . DULoxetine (CYMBALTA) 60 MG capsule Take 1 capsule (60 mg total) by mouth daily. 90 capsule 1  . Fluticasone-Salmeterol (ADVAIR DISKUS) 250-50 MCG/DOSE AEPB Inhale 1 puff into the lungs 2 (two) times daily. 1 each 3  . ibuprofen (ADVIL,MOTRIN) 800 MG tablet Take 1 tablet (800 mg total) by mouth 3 (three) times daily. (Patient taking differently: Take 800 mg by mouth 3 (three) times daily as needed. ) 21 tablet 0  . levocetirizine (XYZAL) 5 MG tablet Take 1 tablet (5 mg total) by mouth every evening. 90 tablet 3  . Levonorgestrel (MIRENA, 52 MG, IU) by Intrauterine route.    Marland Kitchen levothyroxine (SYNTHROID) 125 MCG tablet Take 1 tablet (125 mcg total) by mouth daily before breakfast. 90 tablet 1  . montelukast (SINGULAIR) 10 MG tablet TAKE 1 TABLET(10 MG) BY MOUTH AT BEDTIME 90 tablet 1  . pantoprazole (PROTONIX) 40 MG tablet Take 40 mg by mouth daily.    . Syringe/Needle, Disp, (SYRINGE 3CC/23GX1") 23G X 1" 3 ML MISC Use as directed. 50 each 0  . Azelastine-Fluticasone 137-50 MCG/ACT SUSP Place 1 spray into the nose in the morning and at bedtime. 23 g 5  . EPINEPHrine (AUVI-Q) 0.3 mg/0.3 mL IJ SOAJ injection Inject 0.3 mg into the muscle as needed for anaphylaxis. 1 each 2  . Olopatadine HCl 0.7 % SOLN Apply 1 drop to eye daily as needed (itchy/watery eyes). 2.5 mL 5  . OVER THE COUNTER MEDICATION Take  2 capsules by mouth 2 (two) times daily. TUMERIC (Patient not taking: Reported on 05/06/2020)     No current facility-administered medications for this visit.   Allergies: Allergies  Allergen Reactions  . Hydrocodone Nausea Only and Other (See  Comments)    Hallucination   . Dilaudid [Hydromorphone Hcl]   . Latex Rash  . Penicillins Rash and Other (See Comments)    Childhood allergy   Social History: Social History   Socioeconomic History  . Marital status: Married    Spouse name: Not on file  . Number of children: 2  . Years of education: Not on file  . Highest education level: Not on file  Occupational History  . Not on file  Tobacco Use  . Smoking status: Former Smoker    Packs/day: 0.50    Years: 3.00    Pack years: 1.50    Types: Cigarettes    Quit date: 01/31/2000    Years since quitting: 20.2  . Smokeless tobacco: Never Used  Substance and Sexual Activity  . Alcohol use: Yes    Alcohol/week: 2.0 standard drinks    Types: 2 Glasses of wine per week    Comment: social  . Drug use: No  . Sexual activity: Yes    Birth control/protection: I.U.D.  Other Topics Concern  . Not on file  Social History Narrative   Married, 2 daughters (92 and 41) son age 25   Occupation: Housewife   Daily Caffeine Use   Patient does not get regular exercise.   Social Determinants of Health   Financial Resource Strain:   . Difficulty of Paying Living Expenses: Not on file  Food Insecurity:   . Worried About Charity fundraiser in the Last Year: Not on file  . Ran Out of Food in the Last Year: Not on file  Transportation Needs:   . Lack of Transportation (Medical): Not on file  . Lack of Transportation (Non-Medical): Not on file  Physical Activity:   . Days of Exercise per Week: Not on file  . Minutes of Exercise per Session: Not on file  Stress:   . Feeling of Stress : Not on file  Social Connections:   . Frequency of Communication with Friends and Family: Not on file  . Frequency of Social Gatherings with Friends and Family: Not on file  . Attends Religious Services: Not on file  . Active Member of Clubs or Organizations: Not on file  . Attends Archivist Meetings: Not on file  . Marital Status: Not on  file   Lives in a house built in 2007. Smoking: quit in 2001 Occupation: stay at home  Environmental History: Water Damage/mildew in the house: no Carpet in the family room: no Carpet in the bedroom: yes Heating: gas Cooling: central Pet: yes 3 dogs x 13 yrs, 5 yrs, <85yr  Family History: Family History  Problem Relation Age of Onset  . Barrett's esophagus Mother   . Coronary artery disease Mother   . Congestive Heart Failure Mother   . Hypertension Mother   . Fibromyalgia Mother   . Chronic fatigue Mother   . Anemia Mother   . Irritable bowel syndrome Mother   . Atrial fibrillation Mother   . Colon polyps Father   . Diabetes Father   . Hypertension Father   . Heart disease Father   . Ehlers-Danlos syndrome Daughter   . Anemia Daughter   . Other Daughter  Postural orthostatic hypotension  . Irritable bowel syndrome Daughter   . Alcohol abuse Other   . Arthritis Other   . Hyperlipidemia Other   . Hypertension Other   . Stroke Other   . Coronary artery disease Other   . Clotting disorder Other        Government social research officer  . Crohn's disease Cousin   . Diabetes Paternal Aunt   . Diabetes Paternal Grandfather   . Colon cancer Neg Hx    Problem                               Relation Asthma                                   Mother  Eczema                                No  Food allergy                          No  Allergic rhino conjunctivitis     Father   Review of Systems  Constitutional: Negative for appetite change, chills, fever and unexpected weight change.  HENT: Positive for congestion, postnasal drip, rhinorrhea and sneezing.   Eyes: Positive for itching.  Respiratory: Positive for cough. Negative for chest tightness, shortness of breath and wheezing.   Cardiovascular: Negative for chest pain.  Gastrointestinal: Negative for abdominal pain.  Genitourinary: Negative for difficulty urinating.  Skin: Negative for rash.  Allergic/Immunologic: Positive  for environmental allergies.  Neurological: Positive for headaches.   Objective: BP 120/74   Pulse 89   Temp (!) 96.3 F (35.7 C) (Temporal)   Resp 18   Wt 231 lb (104.8 kg)   SpO2 97%   BMI 34.11 kg/m  Body mass index is 34.11 kg/m. Physical Exam Vitals and nursing note reviewed.  Constitutional:      Appearance: Normal appearance. She is well-developed.  HENT:     Head: Normocephalic and atraumatic.     Right Ear: External ear normal.     Left Ear: External ear normal.     Nose: Nose normal.     Mouth/Throat:     Mouth: Mucous membranes are moist.     Pharynx: Oropharynx is clear.  Eyes:     Conjunctiva/sclera: Conjunctivae normal.  Cardiovascular:     Rate and Rhythm: Normal rate and regular rhythm.     Heart sounds: Normal heart sounds. No murmur heard.  No friction rub. No gallop.   Pulmonary:     Effort: Pulmonary effort is normal.     Breath sounds: Normal breath sounds. No wheezing, rhonchi or rales.  Abdominal:     Palpations: Abdomen is soft.  Musculoskeletal:     Cervical back: Neck supple.  Skin:    General: Skin is warm.     Findings: No rash.  Neurological:     Mental Status: She is alert and oriented to person, place, and time.  Psychiatric:        Behavior: Behavior normal.    The plan was reviewed with the patient/family, and all questions/concerned were addressed.  It was my pleasure to see Christine [name] today and participate in her care. Please feel free to contact me with any questions  or concerns.  Sincerely,  Lucindia Lemley, DO Allergy & Immunology  [facility] [place] office: [phone] [place] office: [phone] 

## 2020-05-06 ENCOUNTER — Ambulatory Visit (INDEPENDENT_AMBULATORY_CARE_PROVIDER_SITE_OTHER): Payer: BC Managed Care – PPO | Admitting: Allergy

## 2020-05-06 ENCOUNTER — Encounter: Payer: Self-pay | Admitting: Allergy

## 2020-05-06 ENCOUNTER — Other Ambulatory Visit: Payer: Self-pay

## 2020-05-06 VITALS — BP 120/74 | HR 89 | Temp 96.3°F | Resp 18 | Wt 231.0 lb

## 2020-05-06 DIAGNOSIS — J4540 Moderate persistent asthma, uncomplicated: Secondary | ICD-10-CM | POA: Diagnosis not present

## 2020-05-06 DIAGNOSIS — H1013 Acute atopic conjunctivitis, bilateral: Secondary | ICD-10-CM

## 2020-05-06 DIAGNOSIS — J3089 Other allergic rhinitis: Secondary | ICD-10-CM

## 2020-05-06 DIAGNOSIS — T50905D Adverse effect of unspecified drugs, medicaments and biological substances, subsequent encounter: Secondary | ICD-10-CM

## 2020-05-06 DIAGNOSIS — T50905A Adverse effect of unspecified drugs, medicaments and biological substances, initial encounter: Secondary | ICD-10-CM | POA: Insufficient documentation

## 2020-05-06 MED ORDER — EPINEPHRINE 0.3 MG/0.3ML IJ SOAJ: 0.3000 mg | INTRAMUSCULAR | 2 refills | Status: DC | PRN

## 2020-05-06 MED ORDER — AZELASTINE-FLUTICASONE 137-50 MCG/ACT NA SUSP: 1.0000 | Freq: Two times a day (BID) | NASAL | 5 refills | Status: DC

## 2020-05-06 MED ORDER — OLOPATADINE HCL 0.7 % OP SOLN: 1.0000 [drp] | Freq: Every day | OPHTHALMIC | 5 refills | Status: DC | PRN

## 2020-05-06 NOTE — Assessment & Plan Note (Addendum)
Diagnosed with asthma about 10 years ago.  Main triggers include allergies.  Restarted Advair 250 mcg 1 puff twice a day and Singulair with good benefit.  Using albuterol now less than once a week. Had Covid-19 in November 2020.  Today's spirometry shows some restriction with no improvement in FEV1 post bronchodilator treatment.  Clinically feeling the same. . Daily controller medication(s): continue Advair 265mcg 1 puff twice a day and rinse mouth after each use. . Continue Singulair (montelukast) 10mg  daily at night. . May use albuterol rescue inhaler 2 puffs every 4 to 6 hours as needed for shortness of breath, chest tightness, coughing, and wheezing. May use albuterol rescue inhaler 2 puffs 5 to 15 minutes prior to strenuous physical activities. Monitor frequency of use.

## 2020-05-06 NOTE — Assessment & Plan Note (Signed)
History of breaking out to penicillin as a child.  Consider penicillin skin testing/drug challenge in the future.  If interested we can schedule drug challenge to penicillin. You must be off antihistamines for 3-5 days before. Must be in good health and not ill. Plan on being in the office for 2-3 hours and must bring in the drug you want to do the oral challenge for - will send in prescription to pick up a few days before. You must call to schedule an appointment and specify it's for a drug challenge.   More than 90% of patients outgrow their penicillin allergy after 10 years.

## 2020-05-06 NOTE — Assessment & Plan Note (Signed)
   See assessment and plan as above for allergic rhinitis.  

## 2020-05-06 NOTE — Patient Instructions (Addendum)
Today's skin testing showed: Positive to grass, weed, trees, cockroaches, mold, cat, dust mites.   Environmental allergies  Start environmental control measures as below.  May use over the counter antihistamines such as Zyrtec (cetirizine), Claritin (loratadine), Allegra (fexofenadine), or Xyzal (levocetirizine) daily as needed. May take twice a day if needed.  Continue Singulair (montelukast) 10mg  daily at night. Start dymista (fluticasone + azelastine nasal spray combination) 1 spray per nostril twice a day. This replaces Flonase (fluticasone) for now. If it's not covered let us know.  May use olopatadine eye drops 0.7% once a day as needed for itchy/watery eyes. Had a detailed discussion with patient/family that clinical history is suggestive of allergic rhinitis, and may benefit from allergy immunotherapy (AIT). Discussed in detail regarding the dosing, schedule, side effects (mild to moderate local allergic reaction and rarely systemic allergic reactions including anaphylaxis), and benefits (significant improvement in nasal symptoms, seasonal flares of asthma) of immunotherapy with the patient. There is significant time commitment involved with allergy shots, which includes weekly immunotherapy injections for first 9-12 months and then biweekly to monthly injections for 3-5 years. Consent was signed. Start allergy injections. I have prescribed epinephrine injectable and demonstrated proper use. For mild symptoms you can take over the counter antihistamines such as Benadryl and monitor symptoms closely. If symptoms worsen or if you have severe symptoms including breathing issues, throat closure, significant swelling, whole body hives, severe diarrhea and vomiting, lightheadedness then inject epinephrine and seek immediate medical care afterwards.  Asthma: . Daily controller medication(s): continue Advair 25mcg 1 puff twice a day and rinse mouth after each use. . Continue Singulair  (montelukast) 10mg  daily at night. . May use albuterol rescue inhaler 2 puffs every 4 to 6 hours as needed for shortness of breath, chest tightness, coughing, and wheezing. May use albuterol rescue inhaler 2 puffs 5 to 15 minutes prior to strenuous physical activities. Monitor frequency of use.  . Asthma control goals:  o Full participation in all desired activities (may need albuterol before activity) o Albuterol use two times or less a week on average (not counting use with activity) o Cough interfering with sleep two times or less a month o Oral steroids no more than once a year o No hospitalizations  Drug allergy:  Consider penicillin skin testing/drug challenge in the future.  If interested we can schedule drug challenge to penicillin. You must be off antihistamines for 3-5 days before. Must be in good health and not ill. Plan on being in the office for 2-3 hours and must bring in the drug you want to do the oral challenge for - will send in prescription to pick up a few days before. You must call to schedule an appointment and specify it's for a drug challenge.   More than 90% of patients outgrow their penicillin allergy.   Follow up in 4 months or sooner if needed.  3 weeks for first allergy injection.  Reducing Pollen Exposure . Pollen seasons: trees (spring), grass (summer) and ragweed/weeds (fall). Marland Kitchen Keep windows closed in your home and car to lower pollen exposure.  Susa Simmonds air conditioning in the bedroom and throughout the house if possible.  . Avoid going out in dry windy days - especially early morning. . Pollen counts are highest between 5 - 10 AM and on dry, hot and windy days.  . Save outside activities for late afternoon or after a heavy rain, when pollen levels are lower.  . Avoid mowing of grass if you have  grass pollen allergy. Marland Kitchen Be aware that pollen can also be transported indoors on people and pets.  . Dry your clothes in an automatic dryer rather than hanging  them outside where they might collect pollen.  . Rinse hair and eyes before bedtime. Cockroach Allergen Avoidance Cockroaches are often found in the homes of densely populated urban areas, schools or commercial buildings, but these creatures can lurk almost anywhere. This does not mean that you have a dirty house or living area. . Block all areas where roaches can enter the home. This includes crevices, wall cracks and windows.  . Cockroaches need water to survive, so fix and seal all leaky faucets and pipes. Have an exterminator go through the house when your family and pets are gone to eliminate any remaining roaches. Marland Kitchen Keep food in lidded containers and put pet food dishes away after your pets are done eating. Vacuum and sweep the floor after meals, and take out garbage and recyclables. Use lidded garbage containers in the kitchen. Wash dishes immediately after use and clean under stoves, refrigerators or toasters where crumbs can accumulate. Wipe off the stove and other kitchen surfaces and cupboards regularly. Pet Allergen Avoidance: . Contrary to popular opinion, there are no "hypoallergenic" breeds of dogs or cats. That is because people are not allergic to an animal's hair, but to an allergen found in the animal's saliva, dander (dead skin flakes) or urine. Pet allergy symptoms typically occur within minutes. For some people, symptoms can build up and become most severe 8 to 12 hours after contact with the animal. People with severe allergies can experience reactions in public places if dander has been transported on the pet owners' clothing. Marland Kitchen Keeping an animal outdoors is only a partial solution, since homes with pets in the yard still have higher concentrations of animal allergens. . Before getting a pet, ask your allergist to determine if you are allergic to animals. If your pet is already considered part of your family, try to minimize contact and keep the pet out of the bedroom and other  rooms where you spend a great deal of time. . As with dust mites, vacuum carpets often or replace carpet with a hardwood floor, tile or linoleum. . High-efficiency particulate air (HEPA) cleaners can reduce allergen levels over time. . While dander and saliva are the source of cat and dog allergens, urine is the source of allergens from rabbits, hamsters, mice and [place] pigs; so ask a non-allergic family member to clean the animal's cage. . If you have a pet allergy, talk to your allergist about the potential for allergy immunotherapy (allergy shots). This strategy can often provide long-term relief. Control of House Dust Mite Allergen . Dust mite allergens are a common trigger of allergy and asthma symptoms. While they can be found throughout the house, these microscopic creatures thrive in warm, humid environments such as bedding, upholstered furniture and carpeting. . Because so much time is spent in the bedroom, it is essential to reduce mite levels there.  . Encase pillows, mattresses, and box springs in special allergen-proof fabric covers or airtight, zippered plastic covers.  . Bedding should be washed weekly in hot water (130 F) and dried in a hot dryer. Allergen-proof covers are available for comforters and pillows that can't be regularly washed.  Wendee Copp the allergy-proof covers every few months. Minimize clutter in the bedroom. Keep pets out of the bedroom.  Marland Kitchen Keep humidity less than 50% by using a dehumidifier or air  conditioning. You can buy a humidity measuring device called a hygrometer to monitor this.  . If possible, replace carpets with hardwood, linoleum, or washable area rugs. If that's not possible, vacuum frequently with a vacuum that has a HEPA filter. . Remove all upholstered furniture and non-washable window drapes from the bedroom. . Remove all non-washable stuffed toys from the bedroom.  Wash stuffed toys weekly.

## 2020-05-06 NOTE — Assessment & Plan Note (Signed)
Worsening perennial rhinoconjunctivitis symptoms for the last 10 years.  Tried Flonase, Singulair, Xyzal, Claritin and Allegra with some benefit.  During the springtime required steroid eyedrops to alleviate symptoms.  No prior allergy immunotherapy.  Today's skin testing showed: Positive to grass, weed, trees, cockroaches, mold, cat, dust mites.   Start environmental control measures as below.  May use over the counter antihistamines such as Zyrtec (cetirizine), Claritin (loratadine), Allegra (fexofenadine), or Xyzal (levocetirizine) daily as needed. May take twice a day if needed.  Continue Singulair (montelukast) 10mg  daily at night. Start dymista (fluticasone + azelastine nasal spray combination) 1 spray per nostril twice a day. This replaces Flonase (fluticasone) for now. If it's not covered let us know.  May use olopatadine eye drops 0.7% once a day as needed for itchy/watery eyes. Had a detailed discussion with patient/family that clinical history is suggestive of allergic rhinitis, and may benefit from allergy immunotherapy (AIT). Discussed in detail regarding the dosing, schedule, side effects (mild to moderate local allergic reaction and rarely systemic allergic reactions including anaphylaxis), and benefits (significant improvement in nasal symptoms, seasonal flares of asthma) of immunotherapy with the patient. There is significant time commitment involved with allergy shots, which includes weekly immunotherapy injections for first 9-12 months and then biweekly to monthly injections for 3-5 years. Consent was signed. Start allergy injections. I have prescribed epinephrine injectable and demonstrated proper use. For mild symptoms you can take over the counter antihistamines such as Benadryl and monitor symptoms closely. If symptoms worsen or if you have severe symptoms including breathing issues, throat closure, significant swelling, whole body hives, severe diarrhea and vomiting,  lightheadedness then inject epinephrine and seek immediate medical care afterwards.

## 2020-05-07 DIAGNOSIS — J302 Other seasonal allergic rhinitis: Secondary | ICD-10-CM | POA: Diagnosis not present

## 2020-05-07 NOTE — Progress Notes (Signed)
VIALS EXP 05-07-21 

## 2020-05-08 DIAGNOSIS — J3089 Other allergic rhinitis: Secondary | ICD-10-CM | POA: Diagnosis not present

## 2020-05-23 ENCOUNTER — Other Ambulatory Visit: Payer: Self-pay | Admitting: Family

## 2020-05-27 ENCOUNTER — Ambulatory Visit: Payer: BC Managed Care – PPO

## 2020-07-02 ENCOUNTER — Other Ambulatory Visit: Payer: Self-pay

## 2020-07-02 ENCOUNTER — Other Ambulatory Visit (HOSPITAL_BASED_OUTPATIENT_CLINIC_OR_DEPARTMENT_OTHER): Payer: Self-pay | Admitting: Foot & Ankle Surgery

## 2020-07-02 ENCOUNTER — Ambulatory Visit (HOSPITAL_BASED_OUTPATIENT_CLINIC_OR_DEPARTMENT_OTHER)
Admission: RE | Admit: 2020-07-02 | Discharge: 2020-07-02 | Disposition: A | Discharge status: Home / Self Care | Payer: BC Managed Care – PPO | Source: Ambulatory Visit | Attending: Foot & Ankle Surgery | Admitting: Foot & Ankle Surgery

## 2020-07-02 DIAGNOSIS — S93401A Sprain of unspecified ligament of right ankle, initial encounter: Secondary | ICD-10-CM

## 2020-07-02 DIAGNOSIS — M7989 Other specified soft tissue disorders: Secondary | ICD-10-CM | POA: Diagnosis not present

## 2020-07-22 ENCOUNTER — Other Ambulatory Visit: Payer: Self-pay | Admitting: Family

## 2020-08-08 ENCOUNTER — Other Ambulatory Visit: Payer: Self-pay | Admitting: Family

## 2020-09-09 ENCOUNTER — Ambulatory Visit: Payer: BC Managed Care – PPO | Admitting: Allergy

## 2020-09-09 NOTE — Progress Notes (Deleted)
Follow Up Note  RE: Christine [name] MRN: [id number] DOB: January 04, 1971 Date of Office Visit: 09/09/2020  Referring provider: Debbrah Alar, NP Primary care provider: Debbrah Alar, NP  Chief Complaint: No chief complaint on file.  History of Present Illness: I had the pleasure of seeing Christine [name] for a follow up visit at the [facility] [place] on 09/09/2020. She is a 50 y.o. female, who is being followed for allergic rhinoconjunctivitis, asthma and drug allergy. Her previous allergy office visit was on 05/06/2020 with Dr. Maudie Mercury. Today is a regular follow up visit.  Other allergic rhinitis Worsening perennial rhinoconjunctivitis symptoms for the last 10 years.  Tried Flonase, Singulair, Xyzal, Claritin and Allegra with some benefit.  During the springtime required steroid eyedrops to alleviate symptoms.  No prior allergy immunotherapy.  Today's skin testing showed: Positive to grass, weed, trees, cockroaches, mold, cat, dust mites.   Start environmental control measures as below.  May use over the counter antihistamines such as Zyrtec (cetirizine), Claritin (loratadine), Allegra (fexofenadine), or Xyzal (levocetirizine) daily as needed. May take twice a day if needed.  Continue Singulair (montelukast) 10mg  daily at night.  Start dymista (fluticasone + azelastine nasal spray combination) 1 spray per nostril twice a day. This replaces Flonase (fluticasone) for now. If it's not covered let us know.   May use olopatadine eye drops 0.7% once a day as needed for itchy/watery eyes.  Had a detailed discussion with patient/family that clinical history is suggestive of allergic rhinitis, and may benefit from allergy immunotherapy (AIT). Discussed in detail regarding the dosing, schedule, side effects (mild to moderate local allergic reaction and rarely systemic allergic reactions including anaphylaxis), and benefits (significant improvement in nasal symptoms, seasonal  flares of asthma) of immunotherapy with the patient. There is significant time commitment involved with allergy shots, which includes weekly immunotherapy injections for first 9-12 months and then biweekly to monthly injections for 3-5 years. Consent was signed.  Start allergy injections.  I have prescribed epinephrine injectable and demonstrated proper use. For mild symptoms you can take over the counter antihistamines such as Benadryl and monitor symptoms closely. If symptoms worsen or if you have severe symptoms including breathing issues, throat closure, significant swelling, whole body hives, severe diarrhea and vomiting, lightheadedness then inject epinephrine and seek immediate medical care afterwards.  Allergic conjunctivitis of both eyes  See assessment and plan as above for allergic rhinitis.  Moderate persistent asthma Diagnosed with asthma about 10 years ago.  Main triggers include allergies.  Restarted Advair 250 mcg 1 puff twice a day and Singulair with good benefit.  Using albuterol now less than once a week. Had Covid-19 in November 2020.  Today's spirometry shows some restriction with no improvement in FEV1 post bronchodilator treatment.  Clinically feeling the same.  Daily controller medication(s):continue Advair 250mcg 1 puff twice a day and rinse mouth after each use.  Continue Singulair (montelukast) 10mg  daily at night.  May use albuterol rescue inhaler 2 puffs every 4 to 6 hours as needed for shortness of breath, chest tightness, coughing, and wheezing. May use albuterol rescue inhaler 2 puffs 5 to 15 minutes prior to strenuous physical activities. Monitor frequency of use.   Drug reaction History of breaking out to penicillin as a child.  Consider penicillin skin testing/drug challenge in the future.  If interested we can schedule drug challenge to penicillin. You must be off antihistamines for 3-5 days before. Must be in good health and not ill. Plan on being  in  the office for 2-3 hours and must bring in the drug you want to do the oral challenge for - will send in prescription to pick up a few days before. You must call to schedule an appointment and specify it's for a drug challenge.   More than 90% of patients outgrow their penicillin allergy after 10 years.  Return in about 4 months (around 09/06/2020).   Assessment and Plan: Christine [name] is a 50 y.o. female with: No problem-specific Assessment & Plan notes found for this encounter.  No follow-ups on file.  No orders of the defined types were placed in this encounter.  Lab Orders  No laboratory test(s) ordered today    Diagnostics: Spirometry:  Tracings reviewed. Her effort: {Blank single:19197::"Good reproducible efforts.","It was hard to get consistent efforts and there is a question as to whether this reflects a maximal maneuver.","Poor effort, data can not be interpreted."} FVC: ***L FEV1: ***L, ***% predicted FEV1/FVC ratio: ***% Interpretation: {Blank single:19197::"Spirometry consistent with mild obstructive disease","Spirometry consistent with moderate obstructive disease","Spirometry consistent with severe obstructive disease","Spirometry consistent with possible restrictive disease","Spirometry consistent with mixed obstructive and restrictive disease","Spirometry uninterpretable due to technique","Spirometry consistent with normal pattern","No overt abnormalities noted given today's efforts"}.  Please see scanned spirometry results for details.  Skin Testing: {Blank single:19197::"Select foods","Environmental allergy panel","Environmental allergy panel and select foods","Food allergy panel","None","Deferred due to recent antihistamines use"}. Positive test to: ***. Negative test to: ***.  Results discussed with patient/family.   Medication List:  Current Outpatient Medications  Medication Sig Dispense Refill  . ADVAIR DISKUS 250-50 MCG/DOSE AEPB INHALE 1 PUFF INTO THE LUNGS  TWICE DAILY 60 each 3  . albuterol (VENTOLIN HFA) 108 (90 Base) MCG/ACT inhaler INHALE 2 PUFFS INTO THE LUNGS EVERY 6 HOURS AS NEEDED FOR WHEEZING OR SHORTNESS OF BREATH 6.7 g 1  . amLODipine (NORVASC) 5 MG tablet TAKE 1 AND 1/2 TABLETS(7.5 MG) BY MOUTH DAILY 135 tablet 1  . Azelastine-Fluticasone 137-50 MCG/ACT SUSP Place 1 spray into the nose in the morning and at bedtime. 23 g 5  . cyanocobalamin (,VITAMIN B-12,) 1000 MCG/ML injection Inject 1 mL (1,000 mcg total) into the skin every 30 (thirty) days. 3 mL 4  . DULoxetine (CYMBALTA) 60 MG capsule Take 1 capsule (60 mg total) by mouth daily. 90 capsule 1  . EPINEPHrine (AUVI-Q) 0.3 mg/0.3 mL IJ SOAJ injection Inject 0.3 mg into the muscle as needed for anaphylaxis. 1 each 2  . ibuprofen (ADVIL,MOTRIN) 800 MG tablet Take 1 tablet (800 mg total) by mouth 3 (three) times daily. (Patient taking differently: Take 800 mg by mouth 3 (three) times daily as needed. ) 21 tablet 0  . levocetirizine (XYZAL) 5 MG tablet Take 1 tablet (5 mg total) by mouth every evening. 90 tablet 3  . Levonorgestrel (MIRENA, 52 MG, IU) by Intrauterine route.    Marland Kitchen levothyroxine (SYNTHROID) 125 MCG tablet TAKE 1 TABLET(125 MCG) BY MOUTH DAILY BEFORE BREAKFAST 90 tablet 1  . montelukast (SINGULAIR) 10 MG tablet TAKE 1 TABLET(10 MG) BY MOUTH AT BEDTIME 90 tablet 1  . Olopatadine HCl 0.7 % SOLN Apply 1 drop to eye daily as needed (itchy/watery eyes). 2.5 mL 5  . OVER THE COUNTER MEDICATION Take 2 capsules by mouth 2 (two) times daily. TUMERIC (Patient not taking: Reported on 05/06/2020)    . pantoprazole (PROTONIX) 40 MG tablet Take 40 mg by mouth daily.    . Syringe/Needle, Disp, (SYRINGE 3CC/23GX1") 23G X 1" 3 ML MISC Use as directed. 50 each  0   No current facility-administered medications for this visit.   Allergies: Allergies  Allergen Reactions  . Hydrocodone Nausea Only and Other (See Comments)    Hallucination   . Dilaudid [Hydromorphone Hcl]   . Latex Rash  .  Penicillins Rash and Other (See Comments)    Childhood allergy   I reviewed her past medical history, social history, family history, and environmental history and no significant changes have been reported from her previous visit.  Review of Systems  Constitutional: Negative for appetite change, chills, fever and unexpected weight change.  HENT: Positive for congestion, postnasal drip, rhinorrhea and sneezing.   Eyes: Positive for itching.  Respiratory: Positive for cough. Negative for chest tightness, shortness of breath and wheezing.   Cardiovascular: Negative for chest pain.  Gastrointestinal: Negative for abdominal pain.  Genitourinary: Negative for difficulty urinating.  Skin: Negative for rash.  Allergic/Immunologic: Positive for environmental allergies.  Neurological: Positive for headaches.   Objective: There were no vitals taken for this visit. There is no height or weight on file to calculate BMI. Physical Exam Vitals and nursing note reviewed.  Constitutional:      Appearance: Normal appearance. She is well-developed.  HENT:     Head: Normocephalic and atraumatic.     Right Ear: External ear normal.     Left Ear: External ear normal.     Nose: Nose normal.     Mouth/Throat:     Mouth: Mucous membranes are moist.     Pharynx: Oropharynx is clear.  Eyes:     Conjunctiva/sclera: Conjunctivae normal.  Cardiovascular:     Rate and Rhythm: Normal rate and regular rhythm.     Heart sounds: Normal heart sounds. No murmur heard. No friction rub. No gallop.   Pulmonary:     Effort: Pulmonary effort is normal.     Breath sounds: Normal breath sounds. No wheezing, rhonchi or rales.  Abdominal:     Palpations: Abdomen is soft.  Musculoskeletal:     Cervical back: Neck supple.  Skin:    General: Skin is warm.     Findings: No rash.  Neurological:     Mental Status: She is alert and oriented to person, place, and time.  Psychiatric:        Behavior: Behavior normal.     Previous notes and tests were reviewed. The plan was reviewed with the patient/family, and all questions/concerned were addressed.  It was my pleasure to see Christine [name] today and participate in her care. Please feel free to contact me with any questions or concerns.  Sincerely,  Rexene Alberts, DO Allergy & Immunology  [facility] office: [place] office: [phone]

## 2020-09-15 ENCOUNTER — Other Ambulatory Visit: Payer: Self-pay | Admitting: Family

## 2020-09-17 ENCOUNTER — Telehealth: Payer: Self-pay | Admitting: *Deleted

## 2020-09-17 NOTE — Telephone Encounter (Signed)
Prior auth denied.  Spoke with patient and she has already picked up medication.  She used some type of discount or coupon card.

## 2020-09-17 NOTE — Telephone Encounter (Signed)
Prior auth started on pantoprazole.   Awaiting response from cover my meds  Key: [id number]

## 2020-09-20 ENCOUNTER — Other Ambulatory Visit: Payer: Self-pay | Admitting: Family

## 2020-10-06 ENCOUNTER — Other Ambulatory Visit: Payer: Self-pay

## 2020-10-06 ENCOUNTER — Ambulatory Visit (INDEPENDENT_AMBULATORY_CARE_PROVIDER_SITE_OTHER): Payer: BC Managed Care – PPO | Admitting: Family

## 2020-10-06 ENCOUNTER — Encounter: Payer: Self-pay | Admitting: Family

## 2020-10-06 VITALS — BP 130/72 | HR 83 | Temp 98.6°F | Resp 16 | Ht 69.0 in | Wt 232.0 lb

## 2020-10-06 DIAGNOSIS — K219 Gastro-esophageal reflux disease without esophagitis: Secondary | ICD-10-CM

## 2020-10-06 DIAGNOSIS — E039 Hypothyroidism, unspecified: Secondary | ICD-10-CM | POA: Diagnosis not present

## 2020-10-06 DIAGNOSIS — Z0000 Encounter for general adult medical examination without abnormal findings: Secondary | ICD-10-CM

## 2020-10-06 DIAGNOSIS — J029 Acute pharyngitis, unspecified: Secondary | ICD-10-CM | POA: Diagnosis not present

## 2020-10-06 DIAGNOSIS — J45909 Unspecified asthma, uncomplicated: Secondary | ICD-10-CM

## 2020-10-06 DIAGNOSIS — E538 Deficiency of other specified B group vitamins: Secondary | ICD-10-CM

## 2020-10-06 DIAGNOSIS — F32A Depression, unspecified: Secondary | ICD-10-CM | POA: Diagnosis not present

## 2020-10-06 LAB — BASIC METABOLIC PANEL
BUN: 11 mg/dL (ref 6–23)
CO2: 28 mEq/L (ref 19–32)
Calcium: 9.5 mg/dL (ref 8.4–10.5)
Chloride: 101 mEq/L (ref 96–112)
Creatinine, Ser: 0.66 mg/dL (ref 0.40–1.20)
GFR: 103.52 mL/min (ref >60.00)
Glucose, Bld: 90 mg/dL (ref 70–99)
Potassium: 4.2 mEq/L (ref 3.5–5.1)
Sodium: 138 mEq/L (ref 135–145)

## 2020-10-06 LAB — VITAMIN B12: Vitamin B-12: 304 pg/mL (ref 211–911)

## 2020-10-06 LAB — TSH: TSH: 0.93 u[IU]/mL (ref 0.35–4.50)

## 2020-10-06 MED ORDER — ALBUTEROL SULFATE HFA 108 (90 BASE) MCG/ACT IN AERS
2.0000 | INHALATION_SPRAY | Freq: Four times a day (QID) | RESPIRATORY_TRACT | 3 refills | Status: DC | PRN
Start: 2020-10-06 — End: 2022-12-28

## 2020-10-06 MED ORDER — CYANOCOBALAMIN 1000 MCG/ML IJ SOLN
1000.0000 ug | INTRAMUSCULAR | 4 refills | Status: DC
Start: 2020-10-06 — End: 2021-11-19

## 2020-10-06 MED ORDER — FLUTICASONE-SALMETEROL 250-50 MCG/DOSE IN AEPB: INHALATION_SPRAY | RESPIRATORY_TRACT | 3 refills | Status: DC

## 2020-10-06 MED ORDER — LEVOCETIRIZINE DIHYDROCHLORIDE 5 MG PO TABS: 5.0000 mg | ORAL_TABLET | Freq: Every evening | ORAL | 3 refills | Status: DC

## 2020-10-06 MED ORDER — NYSTATIN-TRIAMCINOLONE 100000-0.1 UNIT/GM-% EX OINT: 1.0000 "application " | TOPICAL_OINTMENT | Freq: Two times a day (BID) | CUTANEOUS | 0 refills | Status: DC

## 2020-10-06 NOTE — Progress Notes (Signed)
Subjective:    Patient ID: Christine [name], female    DOB: 09-Sep-1970, 50 y.o.   MRN: [id number]  HPI  Patient is a 50 yr old female who presents today for follow up.  HTN- maintained on amlodipine 5mg  once daily.  BP Readings from Last 3 Encounters:  10/06/20 130/72  05/06/20 120/74  04/08/20 136/65   Hypothyroid-  Lab Results  Component Value Date   TSH 1.27 01/02/2020   Asthma- has not needed her rescue inhaler that much- only using once a week. She is currently on advair 250-50, and singulair.    Wt Readings from Last 3 Encounters:  10/06/20 232 lb (105.2 kg)  05/06/20 231 lb (104.8 kg)  04/08/20 228 lb (103.4 kg)   b12- home injections  Depression- maintained on cymbalta 60mg    Hypothyroid- maintained on synthroid 125 mcg daily.  Lab Results  Component Value Date   TSH 1.27 01/02/2020   GERD- maintained on protonix 40mg  once daily.   Review of Systems See HPI  Past Medical History:  Diagnosis Date  . Allergy   . Anemia    stated h/o pernicious anemia  . Arthritis    osteoarthritis  . Asthma    Acute asthmatic bronchitis 2011 while pregnant  . Carpal tunnel syndrome    both wrist  . Depression   . Diverticulitis    recurrent  . Family history of anesthesia complication    nausea and vomiting  . Fibromyalgia   . GERD (gastroesophageal reflux disease)   . Hypothyroidism   . Migraine, unspecified, without mention of intractable migraine without mention of status migrainosus   . Miscarriage 2007  . PONV (postoperative nausea and vomiting)   . Thyroid disease      Social History   Socioeconomic History  . Marital status: Married    Spouse name: Not on file  . Number of children: 2  . Years of education: Not on file  . Highest education level: Not on file  Occupational History  . Not on file  Tobacco Use  . Smoking status: Former Smoker    Packs/day: 0.50    Years: 3.00    Pack years: 1.50    Types: Cigarettes    Quit date: 01/31/2000     Years since quitting: 20.6  . Smokeless tobacco: Never Used  Substance and Sexual Activity  . Alcohol use: Yes    Alcohol/week: 2.0 standard drinks    Types: 2 Glasses of wine per week    Comment: social  . Drug use: No  . Sexual activity: Yes    Birth control/protection: I.U.D.  Other Topics Concern  . Not on file  Social History Narrative   Married, 2 daughters (71 and 83) son age 52   Occupation: Housewife   Daily Caffeine Use   Patient does not get regular exercise.   Social Determinants of Health   Financial Resource Strain: Not on file  Food Insecurity: Not on file  Transportation Needs: Not on file  Physical Activity: Not on file  Stress: Not on file  Social Connections: Not on file  Intimate Partner Violence: Not on file    Past Surgical History:  Procedure Laterality Date  . CHOLECYSTECTOMY N/A 03/25/2014   Procedure: LAPAROSCOPIC CHOLECYSTECTOMY;  Surgeon: Ralene Ok, MD;  Location: [facility];  Service: General;  Laterality: N/A;  . DILATION AND CURETTAGE OF UTERUS     miscarriage  . LAPAROSCOPY  5/94   x 2  . SIGMOIDECTOMY  Hand-assisted laparoscopic sigmoidectomy with splenic flexure takedown.    Family History  Problem Relation Age of Onset  . Barrett's esophagus Mother   . Coronary artery disease Mother   . Congestive Heart Failure Mother   . Hypertension Mother   . Fibromyalgia Mother   . Chronic fatigue Mother   . Anemia Mother   . Irritable bowel syndrome Mother   . Atrial fibrillation Mother   . Colon polyps Father   . Diabetes Father   . Hypertension Father   . Heart disease Father   . Ehlers-Danlos syndrome Daughter   . Anemia Daughter   . Other Daughter        Postural orthostatic hypotension  . Irritable bowel syndrome Daughter   . Alcohol abuse Other   . Arthritis Other   . Hyperlipidemia Other   . Hypertension Other   . Stroke Other   . Coronary artery disease Other   . Clotting disorder Other        Government social research officer   . Crohn's disease Cousin   . Diabetes Paternal Aunt   . Diabetes Paternal Grandfather   . Colon cancer Neg Hx     Allergies  Allergen Reactions  . Hydrocodone Nausea Only and Other (See Comments)    Hallucination   . Dilaudid [Hydromorphone Hcl]   . Latex Rash  . Penicillins Rash and Other (See Comments)    Childhood allergy    Current Outpatient Medications on File Prior to Visit  Medication Sig Dispense Refill  . ADVAIR DISKUS 250-50 MCG/DOSE AEPB INHALE 1 PUFF INTO THE LUNGS TWICE DAILY 60 each 3  . albuterol (VENTOLIN HFA) 108 (90 Base) MCG/ACT inhaler INHALE 2 PUFFS INTO THE LUNGS EVERY 6 HOURS AS NEEDED FOR WHEEZING OR SHORTNESS OF BREATH 6.7 g 1  . amLODipine (NORVASC) 5 MG tablet TAKE 1 AND 1/2 TABLETS(7.5 MG) BY MOUTH DAILY 135 tablet 1  . Azelastine-Fluticasone 137-50 MCG/ACT SUSP Place 1 spray into the nose in the morning and at bedtime. 23 g 5  . cyanocobalamin (,VITAMIN B-12,) 1000 MCG/ML injection Inject 1 mL (1,000 mcg total) into the skin every 30 (thirty) days. 3 mL 4  . DULoxetine (CYMBALTA) 60 MG capsule TAKE 1 CAPSULE(60 MG) BY MOUTH DAILY 90 capsule 1  . EPINEPHrine (AUVI-Q) 0.3 mg/0.3 mL IJ SOAJ injection Inject 0.3 mg into the muscle as needed for anaphylaxis. 1 each 2  . levocetirizine (XYZAL) 5 MG tablet Take 1 tablet (5 mg total) by mouth every evening. 90 tablet 3  . Levonorgestrel (MIRENA, 52 MG, IU) by Intrauterine route.    Marland Kitchen levothyroxine (SYNTHROID) 125 MCG tablet TAKE 1 TABLET(125 MCG) BY MOUTH DAILY BEFORE BREAKFAST 90 tablet 1  . montelukast (SINGULAIR) 10 MG tablet TAKE 1 TABLET(10 MG) BY MOUTH AT BEDTIME 90 tablet 1  . Olopatadine HCl 0.7 % SOLN Apply 1 drop to eye daily as needed (itchy/watery eyes). 2.5 mL 5  . OVER THE COUNTER MEDICATION Take 2 capsules by mouth 2 (two) times daily. TUMERIC    . pantoprazole (PROTONIX) 40 MG tablet TAKE 1 TABLET(40 MG) BY MOUTH DAILY 90 tablet 1  . Syringe/Needle, Disp, (SYRINGE 3CC/23GX1") 23G X 1" 3 ML MISC  Use as directed. 50 each 0   No current facility-administered medications on file prior to visit.    BP 130/72 (BP Location: Right Arm, Patient Position: Sitting, Cuff Size: Large)   Pulse 83   Temp 98.6 F (37 C) (Oral)   Resp 16   Ht 5'  9" (1.753 m)   Wt 232 lb (105.2 kg)   SpO2 99%   BMI 34.26 kg/m       Objective:   Physical Exam Constitutional:      Appearance: She is well-developed and well-nourished.  Cardiovascular:     Rate and Rhythm: Normal rate and regular rhythm.     Heart sounds: Normal heart sounds. No murmur heard.   Pulmonary:     Effort: Pulmonary effort is normal. No respiratory distress.     Breath sounds: Normal breath sounds. No wheezing.  Psychiatric:        Mood and Affect: Mood and affect normal.        Behavior: Behavior normal.        Thought Content: Thought content normal.        Judgment: Judgment normal.           Assessment & Plan:  HTN- bp stable. Continue amlodipine 5mg  once daily.  Asthma- stable with rare use of albuterol.  Continue singulair 10mg  once daily and advair 250/50.  Depression- stable on cymbalta 60mg  once daily. Continue same.  GERD- occasional symptoms, continue protonix 40mg  once daily.  Hypothyroid- clinically stable on synthroid 164mcg- obtain follow up TSH.  B12 deficiency- continues home injections 1050mcg monthly. Check follow up b12.   This visit occurred during the SARS-CoV-2 public health emergency.  Safety protocols were in place, including screening questions prior to the visit, additional usage of staff PPE, and extensive cleaning of exam room while observing appropriate contact time as indicated for disinfecting solutions.

## 2020-10-07 ENCOUNTER — Other Ambulatory Visit: Payer: Self-pay | Admitting: Family

## 2020-10-07 MED ORDER — LEVOTHYROXINE SODIUM 125 MCG PO TABS: ORAL_TABLET | ORAL | 1 refills | Status: DC

## 2020-10-10 ENCOUNTER — Telehealth: Payer: Self-pay

## 2020-10-10 MED ORDER — OMEPRAZOLE 40 MG PO CPDR: 40.0000 mg | DELAYED_RELEASE_CAPSULE | Freq: Every day | ORAL | 1 refills | Status: DC

## 2020-10-10 NOTE — Telephone Encounter (Signed)
Received a noticed from patients medical plar to let us know Pantoprazole sod. 40 mg was now excluded from her plan.  Please advise of new medication.

## 2020-10-10 NOTE — Telephone Encounter (Signed)
Rx sent for omeprazole instead of protonix. Please notify pt.

## 2020-10-13 NOTE — Telephone Encounter (Signed)
Patient advised of change 

## 2020-11-10 DIAGNOSIS — H10013 Acute follicular conjunctivitis, bilateral: Secondary | ICD-10-CM | POA: Diagnosis not present

## 2020-12-17 ENCOUNTER — Encounter: Payer: Self-pay | Admitting: Family

## 2020-12-18 ENCOUNTER — Telehealth: Payer: Self-pay | Admitting: Family

## 2020-12-18 MED ORDER — WEGOVY 0.25 MG/0.5ML ~~LOC~~ SOAJ: 0.2500 mg | SUBCUTANEOUS | 0 refills | Status: DC

## 2020-12-18 NOTE — Telephone Encounter (Signed)
See mychart.  

## 2020-12-19 ENCOUNTER — Telehealth: Payer: Self-pay

## 2020-12-19 NOTE — Telephone Encounter (Signed)
PA started for [facility] via Cover My MedsTRISTY UDOVICH (Key: [id number]) Rx #: [id number] UKGURK 0.25MG /0.5ML auto-injectors

## 2020-12-22 NOTE — Telephone Encounter (Signed)
No thank you- this is not a covered med on her plan.    Please notify pt of denial.

## 2020-12-22 NOTE — Telephone Encounter (Signed)
Left a vm to return call.

## 2020-12-22 NOTE — Telephone Encounter (Signed)
Status: PA Response - Denied  Would you like for [place] to create an appeal?

## 2021-01-11 ENCOUNTER — Other Ambulatory Visit: Payer: Self-pay | Admitting: Family

## 2021-02-25 DIAGNOSIS — H10233 Serous conjunctivitis, except viral, bilateral: Secondary | ICD-10-CM | POA: Diagnosis not present

## 2021-02-27 DIAGNOSIS — Z01419 Encounter for gynecological examination (general) (routine) without abnormal findings: Secondary | ICD-10-CM | POA: Diagnosis not present

## 2021-02-27 DIAGNOSIS — Z1231 Encounter for screening mammogram for malignant neoplasm of breast: Secondary | ICD-10-CM | POA: Diagnosis not present

## 2021-02-27 DIAGNOSIS — Z6834 Body mass index (BMI) 34.0-34.9, adult: Secondary | ICD-10-CM | POA: Diagnosis not present

## 2021-02-27 LAB — HM PAP SMEAR
HM Pap smear: NEGATIVE
HM Pap smear: NEGATIVE

## 2021-02-27 LAB — HM MAMMOGRAPHY

## 2021-03-19 ENCOUNTER — Other Ambulatory Visit: Payer: Self-pay | Admitting: Family

## 2021-04-10 ENCOUNTER — Ambulatory Visit (INDEPENDENT_AMBULATORY_CARE_PROVIDER_SITE_OTHER): Payer: BC Managed Care – PPO | Admitting: Family

## 2021-04-10 ENCOUNTER — Other Ambulatory Visit: Payer: Self-pay

## 2021-04-10 ENCOUNTER — Ambulatory Visit (HOSPITAL_BASED_OUTPATIENT_CLINIC_OR_DEPARTMENT_OTHER)
Admission: RE | Admit: 2021-04-10 | Discharge: 2021-04-10 | Disposition: A | Discharge status: Home / Self Care | Payer: BC Managed Care – PPO | Source: Ambulatory Visit | Attending: Family | Admitting: Family

## 2021-04-10 ENCOUNTER — Telehealth: Payer: Self-pay | Admitting: Family

## 2021-04-10 ENCOUNTER — Encounter: Payer: Self-pay | Admitting: Family

## 2021-04-10 VITALS — BP 122/62 | HR 79 | Temp 98.5°F | Resp 16 | Ht 69.0 in | Wt 225.0 lb

## 2021-04-10 DIAGNOSIS — M545 Low back pain, unspecified: Secondary | ICD-10-CM

## 2021-04-10 DIAGNOSIS — E785 Hyperlipidemia, unspecified: Secondary | ICD-10-CM

## 2021-04-10 DIAGNOSIS — Z23 Encounter for immunization: Secondary | ICD-10-CM | POA: Diagnosis not present

## 2021-04-10 DIAGNOSIS — Z0000 Encounter for general adult medical examination without abnormal findings: Secondary | ICD-10-CM

## 2021-04-10 DIAGNOSIS — R739 Hyperglycemia, unspecified: Secondary | ICD-10-CM

## 2021-04-10 DIAGNOSIS — M546 Pain in thoracic spine: Secondary | ICD-10-CM | POA: Diagnosis not present

## 2021-04-10 LAB — COMPREHENSIVE METABOLIC PANEL
ALT: 56 U/L — ABNORMAL HIGH (ref 0–35)
AST: 19 U/L (ref 0–37)
Albumin: 4.2 g/dL (ref 3.5–5.2)
Alkaline Phosphatase: 106 U/L (ref 39–117)
BUN: 8 mg/dL (ref 6–23)
CO2: 27 mEq/L (ref 19–32)
Calcium: 9.3 mg/dL (ref 8.4–10.5)
Chloride: 103 mEq/L (ref 96–112)
Creatinine, Ser: 0.67 mg/dL (ref 0.40–1.20)
GFR: 102.78 mL/min (ref >60.00)
Glucose, Bld: 100 mg/dL — ABNORMAL HIGH (ref 70–99)
Potassium: 4.1 mEq/L (ref 3.5–5.1)
Sodium: 138 mEq/L (ref 135–145)
Total Bilirubin: 0.3 mg/dL (ref 0.2–1.2)
Total Protein: 7.2 g/dL (ref 6.0–8.3)

## 2021-04-10 LAB — LIPID PANEL
Cholesterol: 174 mg/dL (ref 0–200)
HDL: 40.6 mg/dL (ref >39.00)
LDL Cholesterol: 103 mg/dL — ABNORMAL HIGH (ref 0–99)
NonHDL: 133.08
Total CHOL/HDL Ratio: 4
Triglycerides: 149 mg/dL (ref 0.0–149.0)
VLDL: 29.8 mg/dL (ref 0.0–40.0)

## 2021-04-10 LAB — HEMOGLOBIN A1C: Hgb A1c MFr Bld: 5.7 % (ref 4.6–6.5)

## 2021-04-10 MED ORDER — MELOXICAM 7.5 MG PO TABS: 7.5000 mg | ORAL_TABLET | Freq: Every day | ORAL | 0 refills | Status: DC

## 2021-04-10 NOTE — Telephone Encounter (Signed)
Please request mammo and pap from [facility].

## 2021-04-10 NOTE — Assessment & Plan Note (Signed)
Wt Readings from Last 3 Encounters:  04/10/21 225 lb (102.1 kg)  10/06/20 232 lb (105.2 kg)  05/06/20 231 lb (104.8 kg)   Encouraged pt to work on [employer], exercise, weight loss.

## 2021-04-10 NOTE — Patient Instructions (Signed)
Please complete lab work prior to leaving.   Please complete x-ray on the first floor.

## 2021-04-10 NOTE — Telephone Encounter (Signed)
Records release request faxed to [facility]

## 2021-04-10 NOTE — Progress Notes (Signed)
Subjective:   By signing my name below, I, Lyric Barr-McArthur, attest that this documentation has been prepared under the direction and in the presence of Debbrah Alar, NP, 04/10/2021   Patient ID: Christine [name], female    DOB: 07-31-1971, 50 y.o.   MRN: [id number]  Chief Complaint  Patient presents with   Annual Exam    HPI Patient is in today for a comprehensive physical exam.   Back Pain: She complains of chronic back pain that worsens with certain movements like bending over.  Cholesterol: She has a history of high cholesterol. Lab Results  Component Value Date   CHOL 174 04/10/2021   CHOL 179 04/08/2020   CHOL 182 07/04/2018   Lab Results  Component Value Date   HDL 40.60 04/10/2021   HDL 34 (L) 04/08/2020   HDL 37.20 (L) 07/04/2018   Lab Results  Component Value Date   LDLCALC 103 (H) 04/10/2021   LDLCALC 107 (H) 04/08/2020   LDLCALC 113 (H) 06/04/2016   Lab Results  Component Value Date   TRIG 149.0 04/10/2021   TRIG 267 (H) 04/08/2020   TRIG 204.0 (H) 07/04/2018   Lab Results  Component Value Date   CHOLHDL 4 04/10/2021   CHOLHDL 5.3 (H) 04/08/2020   CHOLHDL 5 07/04/2018   Lab Results  Component Value Date   LDLDIRECT 126.0 07/04/2018   LDLDIRECT 117.0 12/27/2017   LDLDIRECT 135.0 12/03/2015     She denies having any unexpected weight change, ear pain, hearing loss and rhinorrhea, visual disturbance, cough, chest pain and leg swelling, nausea, vomiting, diarrhea and blood in stool, or dysuria and frequency, for myalgias and arthralgias, rash, headaches, adenopathy, depression or anxiety at this time. Depression/Anxiety: She has reported no depression or anxiety but an increase in stress levels with her daughter recently being diagnosed with an eating disorder. Immunizations: She received her flu shot in office today. She had her tetanus shot in 2016. Diet: She is maintaining a healthy diet Exercise: She is not exercising due to her knee  pain.  Colonoscopy: Last performed on 08/15/2009 and found anastomosis but results were normal. No repeat scan requested. Pap Smear: Last performed on 09/09/2017 and results were normal. Repeat in one year. Due. Mammogram: Last performed in July 2022 and findings were normal.  Dental:  Vision:  Family medical and surgical history: Maternal grandmother passed from Graves disease. Maternal grandfather had lung cancer. Paternal grandmother had ovarian cancer and kidney issues. She Drugs: No drug use. Alcohol: She reports drinking occasionally.   Health Maintenance Due  Topic Date Due   HIV Screening  Never done   Hepatitis C Screening  Never done   COLONOSCOPY (Pts 45-31yr Insurance coverage will need to be confirmed)  08/16/2019   MAMMOGRAM  03/27/2020   COVID-19 Vaccine (3 - Booster for Pfizer series) 05/26/2020   PAP SMEAR-Modifier  09/09/2020    Past Medical History:  Diagnosis Date   Allergy    Anemia    stated h/o pernicious anemia   Arthritis    osteoarthritis   Asthma    Acute asthmatic bronchitis 2011 while pregnant   Carpal tunnel syndrome    both wrist   Depression    Diverticulitis    recurrent   Family history of anesthesia complication    nausea and vomiting   Fibromyalgia    GERD (gastroesophageal reflux disease)    Hypothyroidism    Migraine, unspecified, without mention of intractable migraine without mention of status migrainosus  Miscarriage 2007   PONV (postoperative nausea and vomiting)    Thyroid disease     Past Surgical History:  Procedure Laterality Date   CHOLECYSTECTOMY N/A 03/25/2014   Procedure: LAPAROSCOPIC CHOLECYSTECTOMY;  Surgeon: Ralene Ok, MD;  Location: [place];  Service: General;  Laterality: N/A;   DILATION AND CURETTAGE OF UTERUS     miscarriage   LAPAROSCOPY  5/94   x 2   SIGMOIDECTOMY     Hand-assisted laparoscopic sigmoidectomy with splenic flexure takedown.    Family History  Problem Relation Age of Onset    Barrett's esophagus Mother    Coronary artery disease Mother    Congestive Heart Failure Mother    Hypertension Mother    Fibromyalgia Mother    Chronic fatigue Mother    Anemia Mother    Irritable bowel syndrome Mother    Atrial fibrillation Mother    Colon polyps Father    Diabetes Father    Hypertension Father    Heart disease Father    Graves' disease Maternal Grandmother    CAD Maternal Grandfather    Ovarian cancer Paternal Grandmother    Diabetes Paternal Grandfather    Ehlers-Danlos syndrome Daughter    Anemia Daughter    Other Daughter        Postural orthostatic hypotension   Irritable bowel syndrome Daughter    Diabetes Paternal Aunt    Crohn's disease Cousin    Alcohol abuse Other    Arthritis Other    Hyperlipidemia Other    Hypertension Other    Stroke Other    Coronary artery disease Other    Clotting disorder Other        Great Grandfather   Colon cancer Neg Hx     Social History   Socioeconomic History   Marital status: Married    Spouse name: Not on file   Number of children: 2   Years of education: Not on file   Highest education level: Not on file  Occupational History   Not on file  Tobacco Use   Smoking status: Former    Packs/day: 0.50    Years: 3.00    Pack years: 1.50    Types: Cigarettes    Quit date: 01/31/2000    Years since quitting: 21.2   Smokeless tobacco: Never  Substance and Sexual Activity   Alcohol use: Yes    Alcohol/week: 2.0 standard drinks    Types: 2 Glasses of wine per week    Comment: social   Drug use: No   Sexual activity: Yes    Partners: Male    Birth control/protection: I.U.D.  Other Topics Concern   Not on file  Social History Narrative   Married, 2 daughters (1 and 74) son age 8   Occupation: Housewife   Daily Caffeine Use   Patient does not get regular exercise.   Social Determinants of Health   Financial Resource Strain: Not on file  Food Insecurity: Not on file  Transportation Needs: Not on  file  Physical Activity: Not on file  Stress: Not on file  Social Connections: Not on file  Intimate Partner Violence: Not on file    Outpatient Medications Prior to Visit  Medication Sig Dispense Refill   albuterol (VENTOLIN HFA) 108 (90 Base) MCG/ACT inhaler Inhale 2 puffs into the lungs every 6 (six) hours as needed for wheezing or shortness of breath. 6.7 g 3   amLODipine (NORVASC) 5 MG tablet TAKE 1 AND 1/2 TABLETS(7.5 MG) BY MOUTH  DAILY 135 tablet 1   Azelastine-Fluticasone 137-50 MCG/ACT SUSP Place 1 spray into the nose in the morning and at bedtime. 23 g 5   cyanocobalamin (,VITAMIN B-12,) 1000 MCG/ML injection Inject 1 mL (1,000 mcg total) into the skin every 30 (thirty) days. 3 mL 4   DULoxetine (CYMBALTA) 60 MG capsule TAKE 1 CAPSULE(60 MG) BY MOUTH DAILY 90 capsule 1   EPINEPHrine (AUVI-Q) 0.3 mg/0.3 mL IJ SOAJ injection Inject 0.3 mg into the muscle as needed for anaphylaxis. 1 each 2   Fluticasone-Salmeterol (ADVAIR DISKUS) 250-50 MCG/DOSE AEPB INHALE 1 PUFF INTO THE LUNGS TWICE DAILY 60 each 3   levocetirizine (XYZAL) 5 MG tablet Take 1 tablet (5 mg total) by mouth every evening. 90 tablet 3   Levonorgestrel (MIRENA, 52 MG, IU) by Intrauterine route.     levothyroxine (SYNTHROID) 125 MCG tablet TAKE 1 TABLET(125 MCG) BY MOUTH DAILY BEFORE BREAKFAST 90 tablet 1   montelukast (SINGULAIR) 10 MG tablet TAKE 1 TABLET(10 MG) BY MOUTH AT BEDTIME 90 tablet 1   Olopatadine HCl 0.7 % SOLN Apply 1 drop to eye daily as needed (itchy/watery eyes). 2.5 mL 5   omeprazole (PRILOSEC) 40 MG capsule Take 1 capsule (40 mg total) by mouth daily. 90 capsule 1   OVER THE COUNTER MEDICATION Take 2 capsules by mouth 2 (two) times daily. TUMERIC     Semaglutide-Weight Management (WEGOVY) 0.25 MG/0.5ML SOAJ Inject 0.25 mg into the skin once a week. 2 mL 0   Syringe/Needle, Disp, (SYRINGE 3CC/23GX1") 23G X 1" 3 ML MISC Use as directed. 50 each 0   nystatin-triamcinolone ointment (MYCOLOG) Apply 1  application topically 2 (two) times daily. 30 g 0   No facility-administered medications prior to visit.    Allergies  Allergen Reactions   Hydrocodone Nausea Only and Other (See Comments)    Hallucination    Dilaudid [Hydromorphone Hcl]    Latex Rash   Penicillins Rash and Other (See Comments)    Childhood allergy    Review of Systems  Constitutional:        (-) unexpected weight changes (-) adenopathy  HENT:  Negative for ear pain and hearing loss.        (-) rhinorrhea  Eyes:        (-) visual disturbances  Respiratory:  Negative for cough.   Cardiovascular:  Negative for chest pain and leg swelling.  Gastrointestinal:  Negative for blood in stool, diarrhea, nausea and vomiting.  Genitourinary:  Negative for dysuria and frequency.  Musculoskeletal:  Negative for joint pain and myalgias.  Skin:  Negative for rash.       (+) wart on right thigh (+) small lesion on lip  Neurological:  Negative for headaches.  Psychiatric/Behavioral:  Negative for depression. The patient is not nervous/anxious.       Objective:    Physical Exam Constitutional:      General: She is not in acute distress.    Appearance: Normal appearance. She is not ill-appearing.  HENT:     Head: Normocephalic and atraumatic.     Right Ear: Tympanic membrane, ear canal and external ear normal.     Left Ear: Tympanic membrane, ear canal and external ear normal.  Eyes:     Extraocular Movements: Extraocular movements intact.     Pupils: Pupils are equal, round, and reactive to light.     Comments: (-) nystagmus  Cardiovascular:     Rate and Rhythm: Normal rate and regular rhythm.  Heart sounds: Normal heart sounds. No murmur heard.   No gallop.  Pulmonary:     Effort: Pulmonary effort is normal. No respiratory distress.     Breath sounds: Normal breath sounds. No wheezing or rales.  Abdominal:     Palpations: Abdomen is soft.     Tenderness: There is no abdominal tenderness. There is no  guarding.  Musculoskeletal:     Comments: 5/5 strength in upper and lower extremities  Lymphadenopathy:     Cervical: No cervical adenopathy.  Skin:    General: Skin is warm and dry.  Neurological:     Mental Status: She is alert and oriented to person, place, and time.     Deep Tendon Reflexes:     Reflex Scores:      Patellar reflexes are 2+ on the right side and 2+ on the left side. Psychiatric:        Behavior: Behavior normal.        Judgment: Judgment normal.    BP 122/62 (BP Location: Right Arm, Patient Position: Sitting, Cuff Size: Large)   Pulse 79   Temp 98.5 F (36.9 C) (Oral)   Resp 16   Ht 5' 9"  (1.753 m)   Wt 225 lb (102.1 kg)   SpO2 100%   BMI 33.23 kg/m  Wt Readings from Last 3 Encounters:  04/10/21 225 lb (102.1 kg)  10/06/20 232 lb (105.2 kg)  05/06/20 231 lb (104.8 kg)       Assessment & Plan:   Problem List Items Addressed This Visit       Unprioritized   Preventative health care    Wt Readings from Last 3 Encounters:  04/10/21 225 lb (102.1 kg)  10/06/20 232 lb (105.2 kg)  05/06/20 231 lb (104.8 kg)  Encouraged pt to work on [employer], exercise, weight loss.        Hyperlipidemia   Relevant Orders   Lipid panel (Completed)   Comp Met (CMET) (Completed)   Other Visit Diagnoses     Low back pain, unspecified back pain laterality, unspecified chronicity, unspecified whether sciatica present    -  Primary   Relevant Medications   meloxicam (MOBIC) 7.5 MG tablet   Other Relevant Orders   DG Thoracic Spine 2 View   DG Lumbar Spine Complete   Hyperglycemia       Relevant Orders   Hemoglobin A1c (Completed)   Needs flu shot       Relevant Orders   Flu Vaccine QUAD 6+ mos PF IM (Fluarix Quad PF) (Completed)       Meds ordered this encounter  Medications   meloxicam (MOBIC) 7.5 MG tablet    Sig: Take 1 tablet (7.5 mg total) by mouth daily.    Dispense:  14 tablet    Refill:  0    Order Specific Question:   Supervising  Provider    Answer:   Penni Homans A [[id number]]    I, Debbrah Alar, NP, personally preformed the services described in this documentation.  All medical record entries made by the scribe were at my direction and in my presence.  I have reviewed the chart and discharge instructions (if applicable) and agree that the record reflects my personal performance and is accurate and complete. 04/10/2021  I,Lyric Barr-McArthur,acting as a scribe for Nance Pear, NP.,have documented all relevant documentation on the behalf of Nance Pear, NP,as directed by  Nance Pear, NP while in the presence of Nance Pear,  NP.   Nance Pear, NP

## 2021-04-28 ENCOUNTER — Encounter: Payer: Self-pay | Admitting: Family

## 2021-04-28 MED ORDER — MELOXICAM 7.5 MG PO TABS: 7.5000 mg | ORAL_TABLET | Freq: Every day | ORAL | 0 refills | Status: DC

## 2021-05-24 ENCOUNTER — Other Ambulatory Visit: Payer: Self-pay | Admitting: Family

## 2021-06-15 ENCOUNTER — Encounter: Payer: Self-pay | Admitting: Family

## 2021-06-15 ENCOUNTER — Other Ambulatory Visit: Payer: Self-pay | Admitting: Family

## 2021-06-15 MED ORDER — PANTOPRAZOLE SODIUM 40 MG PO TBEC: 40.0000 mg | DELAYED_RELEASE_TABLET | Freq: Every day | ORAL | 3 refills | Status: DC

## 2021-06-16 MED ORDER — MELOXICAM 7.5 MG PO TABS: 7.5000 mg | ORAL_TABLET | Freq: Every day | ORAL | 0 refills | Status: DC

## 2021-07-17 ENCOUNTER — Other Ambulatory Visit: Payer: Self-pay

## 2021-07-17 ENCOUNTER — Encounter: Payer: Self-pay | Admitting: Family

## 2021-07-17 MED ORDER — LEVOTHYROXINE SODIUM 125 MCG PO TABS
ORAL_TABLET | ORAL | 1 refills | Status: DC
Start: 2021-07-17 — End: 2021-12-31

## 2021-07-20 ENCOUNTER — Other Ambulatory Visit: Payer: Self-pay | Admitting: Family

## 2021-07-23 ENCOUNTER — Other Ambulatory Visit: Payer: Self-pay | Admitting: Family

## 2021-09-01 ENCOUNTER — Encounter: Payer: Self-pay | Admitting: Family

## 2021-09-01 MED ORDER — DULOXETINE HCL 60 MG PO CPEP: ORAL_CAPSULE | ORAL | 1 refills | Status: DC

## 2021-09-01 MED ORDER — MONTELUKAST SODIUM 10 MG PO TABS: 10.0000 mg | ORAL_TABLET | Freq: Every day | ORAL | 1 refills | Status: DC

## 2021-09-01 MED ORDER — LEVOCETIRIZINE DIHYDROCHLORIDE 5 MG PO TABS: 5.0000 mg | ORAL_TABLET | Freq: Every evening | ORAL | 1 refills | Status: DC

## 2021-09-01 MED ORDER — MELOXICAM 7.5 MG PO TABS: ORAL_TABLET | ORAL | 0 refills | Status: DC

## 2021-09-10 ENCOUNTER — Other Ambulatory Visit: Payer: Self-pay | Admitting: Family

## 2021-10-10 ENCOUNTER — Other Ambulatory Visit: Payer: Self-pay | Admitting: Family

## 2021-10-16 ENCOUNTER — Encounter: Payer: Self-pay | Admitting: Family

## 2021-10-19 MED ORDER — PANTOPRAZOLE SODIUM 40 MG PO TBEC: 40.0000 mg | DELAYED_RELEASE_TABLET | Freq: Every day | ORAL | 1 refills | Status: DC

## 2021-11-05 ENCOUNTER — Other Ambulatory Visit: Payer: Self-pay | Admitting: Family

## 2021-11-05 ENCOUNTER — Encounter: Payer: Self-pay | Admitting: Family

## 2021-11-05 MED ORDER — AZELASTINE-FLUTICASONE 137-50 MCG/ACT NA SUSP: 1.0000 | Freq: Two times a day (BID) | NASAL | 5 refills | Status: DC

## 2021-11-15 ENCOUNTER — Other Ambulatory Visit: Payer: Self-pay | Admitting: Family

## 2021-11-16 ENCOUNTER — Encounter: Payer: Self-pay | Admitting: Family

## 2021-11-19 ENCOUNTER — Other Ambulatory Visit: Payer: Self-pay | Admitting: Family

## 2021-11-19 ENCOUNTER — Encounter: Payer: Self-pay | Admitting: Family

## 2021-12-04 ENCOUNTER — Ambulatory Visit: Payer: BC Managed Care – PPO | Admitting: Family

## 2021-12-04 ENCOUNTER — Ambulatory Visit (INDEPENDENT_AMBULATORY_CARE_PROVIDER_SITE_OTHER): Payer: BC Managed Care – PPO | Admitting: Family

## 2021-12-04 VITALS — BP 138/73 | HR 82 | Temp 98.8°F | Resp 16 | Wt 234.0 lb

## 2021-12-04 DIAGNOSIS — R739 Hyperglycemia, unspecified: Secondary | ICD-10-CM | POA: Diagnosis not present

## 2021-12-04 DIAGNOSIS — B349 Viral infection, unspecified: Secondary | ICD-10-CM | POA: Diagnosis not present

## 2021-12-04 DIAGNOSIS — E785 Hyperlipidemia, unspecified: Secondary | ICD-10-CM | POA: Diagnosis not present

## 2021-12-04 DIAGNOSIS — E039 Hypothyroidism, unspecified: Secondary | ICD-10-CM

## 2021-12-04 NOTE — Assessment & Plan Note (Signed)
Clinically stable on current dose of synthroid. Will obtain follow up TSH. Continue current dose of synthroid.  ?

## 2021-12-04 NOTE — Progress Notes (Signed)
? ?Subjective:  ? ?By signing my name below, I, Carylon Perches, attest that this documentation has been prepared under the direction and in the presence of Debbrah Alar NP, 12/04/2021 ? ? Patient ID: Christine [name], female    DOB: January 09, 1971, 51 y.o.   MRN: [id number] ? ?Chief Complaint  ?Patient presents with  ? Otalgia  ?  Complains of pain in the left ear  ? Sore Throat  ?  Complains of sore throat on and off   ? ? ?HPI ?Patient is in today for an office visit. ? ?Sore Throat - She is complaining of a sore throat that appeared about 5-7 days ago. Her sore throat is improving but symptoms are still persistent.  ?Jaw Pain - She is also complaining of jaw pain ?Earache - She is also complaining of a possible earache in her right ear. Symptoms also appeared around the same time her sore throat appeared. She feels a knot behind her right ear but does not know if that contributes to an earache. She also states that the area is tender.  ?Thyroid - She is requesting to get her thyroid levels updated. She is currently taking 125 MCG of Synthroid.  ?Vitamins - She is continuing to take her B12 supplements.  ? ?Health Maintenance Due  ?Topic Date Due  ? HIV Screening  Never done  ? Hepatitis C Screening  Never done  ? COLONOSCOPY (Pts 45-74yr Insurance coverage will need to be confirmed)  08/16/2019  ? COVID-19 Vaccine (3 - Booster for Pfizer series) 02/19/2020  ? ? ?Past Medical History:  ?Diagnosis Date  ? Allergy   ? Anemia   ? stated h/o pernicious anemia  ? Arthritis   ? osteoarthritis  ? Asthma   ? Acute asthmatic bronchitis 2011 while pregnant  ? Carpal tunnel syndrome   ? both wrist  ? Depression   ? Diverticulitis   ? recurrent  ? Family history of anesthesia complication   ? nausea and vomiting  ? Fibromyalgia   ? GERD (gastroesophageal reflux disease)   ? Hypothyroidism   ? Migraine, unspecified, without mention of intractable migraine without mention of status migrainosus   ? Miscarriage 2007  ? PONV  (postoperative nausea and vomiting)   ? Thyroid disease   ? ? ?Past Surgical History:  ?Procedure Laterality Date  ? CHOLECYSTECTOMY N/A 03/25/2014  ? Procedure: LAPAROSCOPIC CHOLECYSTECTOMY;  Surgeon: ARalene Ok MD;  Location: [facility]  Service: General;  Laterality: N/A;  ? DILATION AND CURETTAGE OF UTERUS    ? miscarriage  ? LAPAROSCOPY  5/94  ? x 2  ? SIGMOIDECTOMY    ? Hand-assisted laparoscopic sigmoidectomy with splenic flexure takedown.  ? ? ?Family History  ?Problem Relation Age of Onset  ? Barrett's esophagus Mother   ? Coronary artery disease Mother   ? Congestive Heart Failure Mother   ? Hypertension Mother   ? Fibromyalgia Mother   ? Chronic fatigue Mother   ? Anemia Mother   ? Irritable bowel syndrome Mother   ? Atrial fibrillation Mother   ? Colon polyps Father   ? Diabetes Father   ? Hypertension Father   ? Heart disease Father   ? Graves' disease Maternal Grandmother   ? CAD Maternal Grandfather   ? Ovarian cancer Paternal Grandmother   ? Diabetes Paternal Grandfather   ? Ehlers-Danlos syndrome Daughter   ? Anemia Daughter   ? Other Daughter   ?     Postural orthostatic hypotension  ?  Irritable bowel syndrome Daughter   ? Diabetes Paternal Aunt   ? Crohn's disease Cousin   ? Alcohol abuse Other   ? Arthritis Other   ? Hyperlipidemia Other   ? Hypertension Other   ? Stroke Other   ? Coronary artery disease Other   ? Clotting disorder Other   ?     Great Grandfather  ? Colon cancer Neg Hx   ? ? ?Social History  ? ?Socioeconomic History  ? Marital status: Married  ?  Spouse name: Not on file  ? Number of children: 2  ? Years of education: Not on file  ? Highest education level: Not on file  ?Occupational History  ? Not on file  ?Tobacco Use  ? Smoking status: Former  ?  Packs/day: 0.50  ?  Years: 3.00  ?  Pack years: 1.50  ?  Types: Cigarettes  ?  Quit date: 01/31/2000  ?  Years since quitting: 21.8  ? Smokeless tobacco: Never  ?Substance and Sexual Activity  ? Alcohol use: Yes  ?  Alcohol/week: 2.0  standard drinks  ?  Types: 2 Glasses of wine per week  ?  Comment: social  ? Drug use: No  ? Sexual activity: Yes  ?  Partners: Male  ?  Birth control/protection: I.U.D.  ?Other Topics Concern  ? Not on file  ?Social History Narrative  ? Married, 2 daughters (29 and 5) son age 61  ? Occupation: Housewife  ? Daily Caffeine Use  ? Patient does not get regular exercise.  ? ?Social Determinants of Health  ? ?Financial Resource Strain: Not on file  ?Food Insecurity: Not on file  ?Transportation Needs: Not on file  ?Physical Activity: Not on file  ?Stress: Not on file  ?Social Connections: Not on file  ?Intimate Partner Violence: Not on file  ? ? ?Outpatient Medications Prior to Visit  ?Medication Sig Dispense Refill  ? albuterol (VENTOLIN HFA) 108 (90 Base) MCG/ACT inhaler Inhale 2 puffs into the lungs every 6 (six) hours as needed for wheezing or shortness of breath. 6.7 g 3  ? amLODipine (NORVASC) 5 MG tablet TAKE 1 AND 1/2 TABLETS(7.5 MG) BY MOUTH DAILY 135 tablet 1  ? Azelastine-Fluticasone 137-50 MCG/ACT SUSP Place 1 spray into the nose in the morning and at bedtime. 23 g 5  ? cyanocobalamin (,VITAMIN B-12,) 1000 MCG/ML injection ADMINISTER 1 ML(1000 MCG) UNDER THE SKIN EVERY 30 DAYS 3 mL 4  ? DULoxetine (CYMBALTA) 60 MG capsule TAKE 1 CAPSULE(60 MG) BY MOUTH DAILY 90 capsule 1  ? EPINEPHrine (AUVI-Q) 0.3 mg/0.3 mL IJ SOAJ injection Inject 0.3 mg into the muscle as needed for anaphylaxis. 1 each 2  ? Fluticasone-Salmeterol (ADVAIR DISKUS) 250-50 MCG/DOSE AEPB INHALE 1 PUFF INTO THE LUNGS TWICE DAILY 60 each 3  ? levocetirizine (XYZAL) 5 MG tablet Take 1 tablet (5 mg total) by mouth every evening. 90 tablet 1  ? Levonorgestrel (MIRENA, 52 MG, IU) by Intrauterine route.    ? levothyroxine (SYNTHROID) 125 MCG tablet TAKE 1 TABLET(125 MCG) BY MOUTH DAILY BEFORE BREAKFAST 90 tablet 1  ? meloxicam (MOBIC) 7.5 MG tablet TAKE 1 TABLET(7.5 MG) BY MOUTH DAILY 30 tablet 0  ? montelukast (SINGULAIR) 10 MG tablet Take 1 tablet  (10 mg total) by mouth at bedtime. 90 tablet 1  ? Olopatadine HCl 0.7 % SOLN Apply 1 drop to eye daily as needed (itchy/watery eyes). 2.5 mL 5  ? OVER THE COUNTER MEDICATION Take 2 capsules by mouth 2 (  two) times daily. TUMERIC    ? pantoprazole (PROTONIX) 40 MG tablet Take 1 tablet (40 mg total) by mouth daily. 90 tablet 1  ? Semaglutide-Weight Management (WEGOVY) 0.25 MG/0.5ML SOAJ Inject 0.25 mg into the skin once a week. 2 mL 0  ? Syringe/Needle, Disp, (SYRINGE 3CC/23GX1") 23G X 1" 3 ML MISC Use as directed. 50 each 0  ? ?No facility-administered medications prior to visit.  ? ? ?Allergies  ?Allergen Reactions  ? Hydrocodone Nausea Only and Other (See Comments)  ?  Hallucination ?  ? Dilaudid [Hydromorphone Hcl]   ? Latex Rash  ? Penicillins Rash and Other (See Comments)  ?  Childhood allergy  ? ? ?Review of Systems  ?HENT:  Positive for ear pain (Right Ear, Tender) and sore throat.   ?     (+) Jaw Pain  ? ?   ?Objective:  ?  ?Physical Exam ?Constitutional:   ?   General: She is not in acute distress. ?   Appearance: Normal appearance. She is not ill-appearing.  ?HENT:  ?   Head: Normocephalic and atraumatic.  ?   Right Ear: Tympanic membrane, ear canal and external ear normal.  ?   Left Ear: Tympanic membrane, ear canal and external ear normal.  ?   Mouth/Throat:  ?   Mouth: Mucous membranes are moist.  ?   Tongue: No lesions.  ?   Pharynx: Posterior oropharyngeal erythema (mild bilateral) present. No oropharyngeal exudate or uvula swelling.  ?   Tonsils: No tonsillar exudate or tonsillar abscesses. 2+ on the right. 2+ on the left.  ?Eyes:  ?   Extraocular Movements: Extraocular movements intact.  ?   Pupils: Pupils are equal, round, and reactive to light.  ?Neck:  ?   Thyroid: No thyromegaly.  ?Cardiovascular:  ?   Rate and Rhythm: Normal rate and regular rhythm.  ?   Heart sounds: Normal heart sounds. No murmur heard. ?  No gallop.  ?Pulmonary:  ?   Effort: Pulmonary effort is normal. No respiratory distress.   ?   Breath sounds: No wheezing or rales.  ?Lymphadenopathy:  ?   Cervical: No cervical adenopathy.  ?Skin: ?   General: Skin is warm and dry.  ?Neurological:  ?   Mental Status: She is alert and orient

## 2021-12-04 NOTE — Patient Instructions (Signed)
Please complete lab work prior to leaving. ?Continue your xyzal and singulair.  ?You can try adding flonase for your ear pain as well as as needed tylenol or motrin. ?Call if symptoms worsen or if symptoms don't improve in 3-4 days.  ?

## 2021-12-04 NOTE — Assessment & Plan Note (Signed)
Lab Results  ?Component Value Date  ? CHOL 174 04/10/2021  ? HDL 40.60 04/10/2021  ? LDLCALC 103 (H) 04/10/2021  ? LDLDIRECT 126.0 07/04/2018  ? TRIG 149.0 04/10/2021  ? CHOLHDL 4 04/10/2021  ? ?Stable last visit.  Monitor.  ?

## 2021-12-04 NOTE — Assessment & Plan Note (Signed)
New. Recommended the following: ? ?Continue your xyzal and singulair.  ?You can try adding flonase for your ear pain as well as as needed tylenol or motrin. ?Call if symptoms worsen or if symptoms don't improve in 3-4 days.  ?

## 2021-12-05 LAB — COMPREHENSIVE METABOLIC PANEL
AG Ratio: 1.5 (calc) (ref 1.0–2.5)
ALT: 33 U/L — ABNORMAL HIGH (ref 6–29)
AST: 22 U/L (ref 10–35)
Albumin: 4.2 g/dL (ref 3.6–5.1)
Alkaline phosphatase (APISO): 81 U/L (ref 31–125)
BUN: 10 mg/dL (ref 7–25)
CO2: 22 mmol/L (ref 20–32)
Calcium: 9.3 mg/dL (ref 8.6–10.2)
Chloride: 102 mmol/L (ref 98–110)
Creat: 0.77 mg/dL (ref 0.50–0.99)
Globulin: 2.8 g/dL (calc) (ref 1.9–3.7)
Glucose, Bld: 100 mg/dL — ABNORMAL HIGH (ref 65–99)
Potassium: 4.1 mmol/L (ref 3.5–5.3)
Sodium: 138 mmol/L (ref 135–146)
Total Bilirubin: 0.2 mg/dL (ref 0.2–1.2)
Total Protein: 7 g/dL (ref 6.1–8.1)

## 2021-12-05 LAB — TSH: TSH: 1.09 mIU/L

## 2021-12-06 ENCOUNTER — Encounter: Payer: Self-pay | Admitting: Family

## 2021-12-06 ENCOUNTER — Other Ambulatory Visit: Payer: Self-pay | Admitting: Family

## 2021-12-07 MED ORDER — FLUTICASONE-SALMETEROL 250-50 MCG/ACT IN AEPB: 1.0000 | INHALATION_SPRAY | Freq: Two times a day (BID) | RESPIRATORY_TRACT | 3 refills | Status: DC

## 2021-12-21 ENCOUNTER — Encounter: Payer: Self-pay | Admitting: Family

## 2021-12-21 MED ORDER — MELOXICAM 7.5 MG PO TABS: 7.5000 mg | ORAL_TABLET | Freq: Every day | ORAL | 0 refills | Status: DC

## 2021-12-31 ENCOUNTER — Other Ambulatory Visit: Payer: Self-pay | Admitting: Family

## 2022-01-13 ENCOUNTER — Encounter: Payer: Self-pay | Admitting: Family

## 2022-01-14 MED ORDER — MELOXICAM 7.5 MG PO TABS: 7.5000 mg | ORAL_TABLET | Freq: Every day | ORAL | 1 refills | Status: DC

## 2022-01-20 MED ORDER — OMEPRAZOLE 40 MG PO CPDR: 40.0000 mg | DELAYED_RELEASE_CAPSULE | Freq: Every day | ORAL | 1 refills | Status: DC

## 2022-01-21 MED ORDER — PANTOPRAZOLE SODIUM 40 MG PO TBEC: 40.0000 mg | DELAYED_RELEASE_TABLET | Freq: Every day | ORAL | 3 refills | Status: DC

## 2022-01-21 NOTE — Addendum Note (Signed)
Addended by: Debbrah Alar on: 01/21/2022 05:20 PM   Modules accepted: Orders

## 2022-02-10 ENCOUNTER — Encounter: Payer: Self-pay | Admitting: Family

## 2022-02-10 MED ORDER — LEVOCETIRIZINE DIHYDROCHLORIDE 5 MG PO TABS: 5.0000 mg | ORAL_TABLET | Freq: Every evening | ORAL | 1 refills | Status: DC

## 2022-03-07 ENCOUNTER — Other Ambulatory Visit: Payer: Self-pay | Admitting: Family

## 2022-03-08 ENCOUNTER — Other Ambulatory Visit: Payer: Self-pay

## 2022-03-08 MED ORDER — MONTELUKAST SODIUM 10 MG PO TABS: 10.0000 mg | ORAL_TABLET | Freq: Every day | ORAL | 0 refills | Status: DC

## 2022-03-08 MED ORDER — DULOXETINE HCL 60 MG PO CPEP: ORAL_CAPSULE | ORAL | 0 refills | Status: DC

## 2022-04-14 ENCOUNTER — Encounter: Payer: Self-pay | Admitting: Family

## 2022-04-14 MED ORDER — PANTOPRAZOLE SODIUM 40 MG PO TBEC: 40.0000 mg | DELAYED_RELEASE_TABLET | Freq: Every day | ORAL | 3 refills | Status: DC

## 2022-04-26 ENCOUNTER — Other Ambulatory Visit: Payer: Self-pay

## 2022-04-26 DIAGNOSIS — K219 Gastro-esophageal reflux disease without esophagitis: Secondary | ICD-10-CM

## 2022-04-26 MED ORDER — PANTOPRAZOLE SODIUM 40 MG PO TBEC: 40.0000 mg | DELAYED_RELEASE_TABLET | Freq: Every day | ORAL | 3 refills | Status: DC

## 2022-05-06 ENCOUNTER — Other Ambulatory Visit: Payer: Self-pay | Admitting: Family

## 2022-05-27 ENCOUNTER — Other Ambulatory Visit: Payer: Self-pay | Admitting: Family

## 2022-06-10 DIAGNOSIS — M1712 Unilateral primary osteoarthritis, left knee: Secondary | ICD-10-CM | POA: Diagnosis not present

## 2022-06-10 DIAGNOSIS — M25562 Pain in left knee: Secondary | ICD-10-CM | POA: Diagnosis not present

## 2022-06-10 DIAGNOSIS — G8929 Other chronic pain: Secondary | ICD-10-CM | POA: Diagnosis not present

## 2022-06-16 ENCOUNTER — Ambulatory Visit (INDEPENDENT_AMBULATORY_CARE_PROVIDER_SITE_OTHER): Payer: BC Managed Care – PPO | Admitting: Family

## 2022-06-16 ENCOUNTER — Encounter: Payer: Self-pay | Admitting: Family

## 2022-06-16 ENCOUNTER — Telehealth: Payer: Self-pay

## 2022-06-16 ENCOUNTER — Telehealth: Payer: Self-pay | Admitting: Family

## 2022-06-16 VITALS — BP 131/63 | HR 85 | Temp 98.4°F | Ht 69.0 in | Wt 234.8 lb

## 2022-06-16 DIAGNOSIS — E538 Deficiency of other specified B group vitamins: Secondary | ICD-10-CM

## 2022-06-16 DIAGNOSIS — J4540 Moderate persistent asthma, uncomplicated: Secondary | ICD-10-CM | POA: Diagnosis not present

## 2022-06-16 DIAGNOSIS — E669 Obesity, unspecified: Secondary | ICD-10-CM

## 2022-06-16 DIAGNOSIS — K219 Gastro-esophageal reflux disease without esophagitis: Secondary | ICD-10-CM | POA: Diagnosis not present

## 2022-06-16 DIAGNOSIS — E039 Hypothyroidism, unspecified: Secondary | ICD-10-CM | POA: Diagnosis not present

## 2022-06-16 DIAGNOSIS — I10 Essential (primary) hypertension: Secondary | ICD-10-CM

## 2022-06-16 DIAGNOSIS — M545 Low back pain, unspecified: Secondary | ICD-10-CM

## 2022-06-16 DIAGNOSIS — E6609 Other obesity due to excess calories: Secondary | ICD-10-CM | POA: Insufficient documentation

## 2022-06-16 DIAGNOSIS — Z23 Encounter for immunization: Secondary | ICD-10-CM | POA: Diagnosis not present

## 2022-06-16 DIAGNOSIS — F419 Anxiety disorder, unspecified: Secondary | ICD-10-CM

## 2022-06-16 LAB — COMPREHENSIVE METABOLIC PANEL
ALT: 33 U/L (ref 0–35)
AST: 14 U/L (ref 0–37)
Albumin: 4.4 g/dL (ref 3.5–5.2)
Alkaline Phosphatase: 96 U/L (ref 39–117)
BUN: 16 mg/dL (ref 6–23)
CO2: 32 mEq/L (ref 19–32)
Calcium: 9.4 mg/dL (ref 8.4–10.5)
Chloride: 102 mEq/L (ref 96–112)
Creatinine, Ser: 0.75 mg/dL (ref 0.40–1.20)
GFR: 92.84 mL/min (ref >60.00)
Glucose, Bld: 95 mg/dL (ref 70–99)
Potassium: 4.6 mEq/L (ref 3.5–5.1)
Sodium: 138 mEq/L (ref 135–145)
Total Bilirubin: 0.3 mg/dL (ref 0.2–1.2)
Total Protein: 7.3 g/dL (ref 6.0–8.3)

## 2022-06-16 LAB — TSH: TSH: 2.16 u[IU]/mL (ref 0.35–5.50)

## 2022-06-16 LAB — VITAMIN B12: Vitamin B-12: 289 pg/mL (ref 211–911)

## 2022-06-16 MED ORDER — TIRZEPATIDE 2.5 MG/0.5ML ~~LOC~~ SOAJ: 2.5000 mg | SUBCUTANEOUS | 0 refills | Status: DC

## 2022-06-16 MED ORDER — "SYRINGE 23G X 1"" 3 ML MISC": 0 refills | Status: DC

## 2022-06-16 MED ORDER — CYANOCOBALAMIN 1000 MCG/ML IJ SOLN: INTRAMUSCULAR | 4 refills | Status: DC

## 2022-06-16 NOTE — Assessment & Plan Note (Signed)
Continues synthroid, check follow up TSH.

## 2022-06-16 NOTE — Progress Notes (Signed)
Subjective:   By signing my name below, I, Shehryar Baig, attest that this documentation has been prepared under the direction and in the presence of Debbrah Alar, NP. 06/16/2022    Patient ID: Christine [name], female    DOB: [birth year], 51 y.o.   MRN: [id number]  Chief Complaint  Patient presents with   thyroid check up    Refill on needles  Flu shot     HPI Patient is in today for a follow up visit.   Anxiety/Focus- She has difficulty focusing. She has also has difficulty remembering. Her anxiety has worsened. Her family is causing most of her stress. She is not seeing a counselor at this time. She continues taking 60 mg Cymbalta daily PO and reports no new issues while taking it.   Back pain- She continues taking meloxicam to manage her back pain.  Weight loss- She is interested in taking medication to help assist with weight loss.  Wt Readings from Last 3 Encounters:  06/16/22 234 lb 12.8 oz (106.5 kg)  12/04/21 234 lb (106.1 kg)  04/10/21 225 lb (102.1 kg)   Blood pressure- Her blood pressure is doing well during this visit. She continues taking 5 mg amlodipine daily PO. BP Readings from Last 3 Encounters:  06/16/22 131/63  12/04/21 138/73  04/10/21 122/62   Pulse Readings from Last 3 Encounters:  06/16/22 85  12/04/21 82  04/10/21 79   Reflux- She reports her reflux has worsened.   Knee pain- She received an injection in her knee last week and reports finding relief after.   Immunizations- She is interested in receiving the flu vaccine during this visit. She is interested in receiving the Covid-19 vaccine at her pharmacy.   Health Maintenance Due  Topic Date Due   HIV Screening  Never done   Hepatitis C Screening  Never done   COLONOSCOPY (Pts 45-43yr Insurance coverage will need to be confirmed)  08/16/2019   COVID-19 Vaccine (3 - Pfizer series) 02/19/2020   Zoster Vaccines- Shingrix (1 of 2) Never done   MAMMOGRAM  02/27/2022    Past Medical  History:  Diagnosis Date   Allergy    Anemia    stated h/o pernicious anemia   Arthritis    osteoarthritis   Asthma    Acute asthmatic bronchitis 2011 while pregnant   Carpal tunnel syndrome    both wrist   Depression    Diverticulitis    recurrent   Family history of anesthesia complication    nausea and vomiting   Fibromyalgia    GERD (gastroesophageal reflux disease)    Hypothyroidism    Migraine, unspecified, without mention of intractable migraine without mention of status migrainosus    Miscarriage 2007   PONV (postoperative nausea and vomiting)    Thyroid disease     Past Surgical History:  Procedure Laterality Date   CHOLECYSTECTOMY N/A 03/25/2014   Procedure: LAPAROSCOPIC CHOLECYSTECTOMY;  Surgeon: ARalene Ok MD;  Location: [facility]  Service: General;  Laterality: N/A;   DILATION AND CURETTAGE OF UTERUS     miscarriage   LAPAROSCOPY  5/94   x 2   SIGMOIDECTOMY     Hand-assisted laparoscopic sigmoidectomy with splenic flexure takedown.    Family History  Problem Relation Age of Onset   Barrett's esophagus Mother    Coronary artery disease Mother    Congestive Heart Failure Mother    Hypertension Mother    Fibromyalgia Mother    Chronic fatigue Mother  Anemia Mother    Irritable bowel syndrome Mother    Atrial fibrillation Mother    Colon polyps Father    Diabetes Father    Hypertension Father    Heart disease Father    Graves' disease Maternal Grandmother    CAD Maternal Grandfather    Ovarian cancer Paternal Grandmother    Diabetes Paternal Grandfather    Ehlers-Danlos syndrome Daughter    Anemia Daughter    Other Daughter        Postural orthostatic hypotension   Irritable bowel syndrome Daughter    Diabetes Paternal Aunt    Crohn's disease Cousin    Alcohol abuse Other    Arthritis Other    Hyperlipidemia Other    Hypertension Other    Stroke Other    Coronary artery disease Other    Clotting disorder Other        Great Grandfather    Colon cancer Neg Hx     Social History   Socioeconomic History   Marital status: Married    Spouse name: Not on file   Number of children: 2   Years of education: Not on file   Highest education level: Not on file  Occupational History   Not on file  Tobacco Use   Smoking status: Former    Packs/day: 0.50    Years: 3.00    Total pack years: 1.50    Types: Cigarettes    Quit date: 01/31/2000    Years since quitting: 22.3   Smokeless tobacco: Never  Substance and Sexual Activity   Alcohol use: Yes    Alcohol/week: 2.0 standard drinks of alcohol    Types: 2 Glasses of wine per week    Comment: social   Drug use: No   Sexual activity: Yes    Partners: Male    Birth control/protection: I.U.D.  Other Topics Concern   Not on file  Social History Narrative   Married, 2 daughters (47 and 66) son age 53   Occupation: Housewife   Daily Caffeine Use   Patient does not get regular exercise.   Social Determinants of Health   Financial Resource Strain: Not on file  Food Insecurity: Not on file  Transportation Needs: Not on file  Physical Activity: Not on file  Stress: Not on file  Social Connections: Not on file  Intimate Partner Violence: Not on file    Outpatient Medications Prior to Visit  Medication Sig Dispense Refill   albuterol (VENTOLIN HFA) 108 (90 Base) MCG/ACT inhaler Inhale 2 puffs into the lungs every 6 (six) hours as needed for wheezing or shortness of breath. 6.7 g 3   amLODipine (NORVASC) 5 MG tablet TAKE 1 AND 1/2 TABLETS(7.5 MG) BY MOUTH DAILY 135 tablet 1   Azelastine-Fluticasone 137-50 MCG/ACT SUSP Place 1 spray into the nose in the morning and at bedtime. 23 g 5   cyanocobalamin (,VITAMIN B-12,) 1000 MCG/ML injection ADMINISTER 1 ML(1000 MCG) UNDER THE SKIN EVERY 30 DAYS 3 mL 4   DULoxetine (CYMBALTA) 60 MG capsule TAKE 1 CAPSULE(60 MG) BY MOUTH DAILY 90 capsule 0   EPINEPHrine (AUVI-Q) 0.3 mg/0.3 mL IJ SOAJ injection Inject 0.3 mg into the muscle as  needed for anaphylaxis. 1 each 2   fluticasone-salmeterol (ADVAIR DISKUS) 250-50 MCG/ACT AEPB Inhale 1 puff into the lungs in the morning and at bedtime. 60 each 3   levocetirizine (XYZAL) 5 MG tablet Take 1 tablet (5 mg total) by mouth every evening. 90 tablet 1  Levonorgestrel (MIRENA, 52 MG, IU) by Intrauterine route.     levothyroxine (SYNTHROID) 125 MCG tablet TAKE 1 TABLET(125 MCG) BY MOUTH DAILY BEFORE BREAKFAST 90 tablet 1   meloxicam (MOBIC) 7.5 MG tablet Take 1 tablet (7.5 mg total) by mouth daily. 30 tablet 1   montelukast (SINGULAIR) 10 MG tablet Take 1 tablet (10 mg total) by mouth at bedtime. 90 tablet 0   Olopatadine HCl 0.7 % SOLN Apply 1 drop to eye daily as needed (itchy/watery eyes). 2.5 mL 5   OVER THE COUNTER MEDICATION Take 2 capsules by mouth 2 (two) times daily. TUMERIC     pantoprazole (PROTONIX) 40 MG tablet Take 1 tablet (40 mg total) by mouth daily. 30 tablet 3   Syringe/Needle, Disp, (SYRINGE 3CC/23GX1") 23G X 1" 3 ML MISC Use as directed. 50 each 0   Semaglutide-Weight Management (WEGOVY) 0.25 MG/0.5ML SOAJ Inject 0.25 mg into the skin once a week. 2 mL 0   No facility-administered medications prior to visit.    Allergies  Allergen Reactions   Hydrocodone Nausea Only and Other (See Comments)    Hallucination    Dilaudid [Hydromorphone Hcl]    Latex Rash   Penicillins Rash and Other (See Comments)    Childhood allergy    Review of Systems  Gastrointestinal:  Positive for heartburn.  Psychiatric/Behavioral:  The patient is nervous/anxious.        Objective:    Physical Exam Constitutional:      General: She is not in acute distress.    Appearance: Normal appearance. She is not ill-appearing.  HENT:     Head: Normocephalic and atraumatic.     Right Ear: External ear normal.     Left Ear: External ear normal.  Eyes:     Extraocular Movements: Extraocular movements intact.     Pupils: Pupils are equal, round, and reactive to light.   Cardiovascular:     Rate and Rhythm: Normal rate and regular rhythm.     Heart sounds: Normal heart sounds. No murmur heard.    No gallop.  Pulmonary:     Effort: Pulmonary effort is normal. No respiratory distress.     Breath sounds: Normal breath sounds. No wheezing or rales.  Skin:    General: Skin is warm and dry.  Neurological:     Mental Status: She is alert and oriented to person, place, and time.  Psychiatric:        Judgment: Judgment normal.     BP 131/63 (BP Location: Right Arm, Patient Position: Sitting, Cuff Size: Large)   Pulse 85   Temp 98.4 F (36.9 C) (Oral)   Ht _0  (1.753 m)   Wt 234 lb 12.8 oz (106.5 kg)   SpO2 98%   BMI 34.67 kg/m  Wt Readings from Last 3 Encounters:  06/16/22 234 lb 12.8 oz (106.5 kg)  12/04/21 234 lb (106.1 kg)  04/10/21 225 lb (102.1 kg)       Assessment & Plan:   Problem List Items Addressed This Visit       Unprioritized   Primary hypertension    BP Readings from Last 3 Encounters:  06/16/22 131/63  12/04/21 138/73  04/10/21 122/62  BP stable on amlodipine.       Relevant Orders   Comp Met (CMET)   Obesity (BMI 30.0-34.9)    She is interested in trying Zepbound if covered by insurance. Discussed limiting calories to <1500/day and also adding regular exercise.  Moderate persistent asthma    Stable on advair and singulair.       Low back pain    Chronic. Pt given exercises to do at home. Continue meloxicam prn.       Hypothyroidism - Primary    Continues synthroid, check follow up TSH.       Relevant Orders   TSH   GASTROESOPHAGEAL REFLUX DISEASE    Continue gerd precautions, continue pantoprazole.       B12 deficiency    Continues b12 monthly injections.        Relevant Medications   Syringe/Needle, Disp, (SYRINGE 3CC/23GX1") 23G X 1" 3 ML MISC   Other Relevant Orders   B12   Anxiety    Uncontrolled. Recommend that she continue cymbalta and establish with a counselor.       Other  Visit Diagnoses     Need for influenza vaccination       Relevant Orders   Flu Vaccine QUAD 6+ mos PF IM (Fluarix Quad PF) (Completed)        Meds ordered this encounter  Medications   tirzepatide (MOUNJARO) 2.5 MG/0.5ML Pen    Sig: Inject 2.5 mg into the skin once a week.    Dispense:  2 mL    Refill:  0    Order Specific Question:   Supervising Provider    Answer:   Penni Homans A [[id number]]   Syringe/Needle, Disp, (SYRINGE 3CC/23GX1") 23G X 1" 3 ML MISC    Sig: Use as directed.    Dispense:  12 each    Refill:  0    Order Specific Question:   Supervising Provider    Answer:   Penni Homans A [[id number]]    I, Nance Pear, NP, personally preformed the services described in this documentation.  All medical record entries made by the scribe were at my direction and in my presence.  I have reviewed the chart and discharge instructions (if applicable) and agree that the record reflects my personal performance and is accurate and complete. 06/16/2022   I,Shehryar Baig,acting as a Education administrator for Nance Pear, NP.,have documented all relevant documentation on the behalf of Nance Pear, NP,as directed by  Nance Pear, NP while in the presence of Nance Pear, NP.   Nance Pear, NP

## 2022-06-16 NOTE — Assessment & Plan Note (Signed)
She is interested in trying Zepbound if covered by insurance. Discussed limiting calories to <1500/day and also adding regular exercise.

## 2022-06-16 NOTE — Assessment & Plan Note (Signed)
Uncontrolled. Recommend that she continue cymbalta and establish with a counselor.

## 2022-06-16 NOTE — Assessment & Plan Note (Signed)
Continue gerd precautions, continue pantoprazole.

## 2022-06-16 NOTE — Assessment & Plan Note (Signed)
Continues b12 monthly injections.

## 2022-06-16 NOTE — Assessment & Plan Note (Signed)
Chronic. Pt given exercises to do at home. Continue meloxicam prn.

## 2022-06-16 NOTE — Telephone Encounter (Signed)
Received PA for [facility] because Zepbound is not yet available in the pharmacy. Will have to try again in several weeks.

## 2022-06-16 NOTE — Assessment & Plan Note (Signed)
BP Readings from Last 3 Encounters:  06/16/22 131/63  12/04/21 138/73  04/10/21 122/62   BP stable on amlodipine.

## 2022-06-16 NOTE — Telephone Encounter (Signed)
B12 is on the low end- please increase frequency of home b12 injections to every 2 weeks. Repeat B12 in 3 months.   Thyroid testing looks good. Kidneys, sugar, liver function looks good.

## 2022-06-16 NOTE — Assessment & Plan Note (Signed)
Stable on advair and singulair.

## 2022-06-18 ENCOUNTER — Other Ambulatory Visit: Payer: Self-pay

## 2022-06-18 NOTE — Telephone Encounter (Signed)
Patient advised of results and new medication dose

## 2022-06-22 ENCOUNTER — Telehealth: Payer: Self-pay | Admitting: *Deleted

## 2022-06-22 NOTE — Telephone Encounter (Signed)
Prior auth started via cover my meds.  Awaiting determination.  Key: [id number]

## 2022-07-03 ENCOUNTER — Other Ambulatory Visit: Payer: Self-pay | Admitting: Family

## 2022-07-06 ENCOUNTER — Telehealth (INDEPENDENT_AMBULATORY_CARE_PROVIDER_SITE_OTHER): Payer: BC Managed Care – PPO | Admitting: Family

## 2022-07-06 DIAGNOSIS — U071 COVID-19: Secondary | ICD-10-CM | POA: Diagnosis not present

## 2022-07-06 NOTE — Progress Notes (Signed)
MyChart Video Visit    Virtual Visit via Video Note   This visit type was conducted due to national recommendations for restrictions regarding the COVID-19 Pandemic (e.g. social distancing) in an effort to limit this patient's exposure and mitigate transmission in our community. This patient is at least at moderate risk for complications without adequate follow up. This format is felt to be most appropriate for this patient at this time. Physical exam was limited by quality of the video and audio technology used for the visit. CMA was able to get the patient set up on a video visit.  Patient location: Home Patient and provider in visit Provider location: Office  I discussed the limitations of evaluation and management by telemedicine and the availability of in person appointments. The patient expressed understanding and agreed to proceed.  Visit Date: 07/06/2022  Today's healthcare provider: Nance Pear, NP     Subjective:    Patient ID: Christine [name], female    DOB: [birth year], 51 y.o.   MRN: [id number]  Chief Complaint  Patient presents with   Covid Positive    Tested positive this morning   covid symptoms    Body aches, congestion and cough. Symptoms started Sunday evening   Fever    Complains of chills    HPI Patient is in today for a virtual office visit  Covid Positive: She tested positive for Covid on 07/06/2022. She had a mild sore throat on 07/03/2022 and then some body aches on 07/04/2022. On 07/05/2022 she presents with generalized body aches, fever, congestion and cough. She is not UTD on the Covid vaccine. Her last case of Covid was sometime in 2020.   Past Medical History:  Diagnosis Date   Allergy    Anemia    stated h/o pernicious anemia   Arthritis    osteoarthritis   Asthma    Acute asthmatic bronchitis 2011 while pregnant   Carpal tunnel syndrome    both wrist   Depression    Diverticulitis    recurrent   Family history of  anesthesia complication    nausea and vomiting   Fibromyalgia    GERD (gastroesophageal reflux disease)    Hypothyroidism    Migraine, unspecified, without mention of intractable migraine without mention of status migrainosus    Miscarriage 2007   PONV (postoperative nausea and vomiting)    Thyroid disease     Past Surgical History:  Procedure Laterality Date   CHOLECYSTECTOMY N/A 03/25/2014   Procedure: LAPAROSCOPIC CHOLECYSTECTOMY;  Surgeon: Ralene Ok, MD;  Location: [facility];  Service: General;  Laterality: N/A;   DILATION AND CURETTAGE OF UTERUS     miscarriage   LAPAROSCOPY  5/94   x 2   SIGMOIDECTOMY     Hand-assisted laparoscopic sigmoidectomy with splenic flexure takedown.    Family History  Problem Relation Age of Onset   Barrett's esophagus Mother    Coronary artery disease Mother    Congestive Heart Failure Mother    Hypertension Mother    Fibromyalgia Mother    Chronic fatigue Mother    Anemia Mother    Irritable bowel syndrome Mother    Atrial fibrillation Mother    Colon polyps Father    Diabetes Father    Hypertension Father    Heart disease Father    Graves' disease Maternal Grandmother    CAD Maternal Grandfather    Ovarian cancer Paternal Grandmother    Diabetes Paternal Grandfather    Ehlers-Danlos syndrome Daughter  Anemia Daughter    Other Daughter        Postural orthostatic hypotension   Irritable bowel syndrome Daughter    Diabetes Paternal Aunt    Crohn's disease Cousin    Alcohol abuse Other    Arthritis Other    Hyperlipidemia Other    Hypertension Other    Stroke Other    Coronary artery disease Other    Clotting disorder Other        Great Grandfather   Colon cancer Neg Hx     Social History   Socioeconomic History   Marital status: Married    Spouse name: Not on file   Number of children: 2   Years of education: Not on file   Highest education level: Not on file  Occupational History   Not on file  Tobacco Use    Smoking status: Former    Packs/day: 0.50    Years: 3.00    Total pack years: 1.50    Types: Cigarettes    Quit date: 01/31/2000    Years since quitting: 22.4   Smokeless tobacco: Never  Substance and Sexual Activity   Alcohol use: Yes    Alcohol/week: 2.0 standard drinks of alcohol    Types: 2 Glasses of wine per week    Comment: social   Drug use: No   Sexual activity: Yes    Partners: Male    Birth control/protection: I.U.D.  Other Topics Concern   Not on file  Social History Narrative   Married, 2 daughters (51 and 65) son age 11   Occupation: Housewife   Daily Caffeine Use   Patient does not get regular exercise.   Social Determinants of Health   Financial Resource Strain: Not on file  Food Insecurity: Not on file  Transportation Needs: Not on file  Physical Activity: Not on file  Stress: Not on file  Social Connections: Not on file  Intimate Partner Violence: Not on file    Outpatient Medications Prior to Visit  Medication Sig Dispense Refill   albuterol (VENTOLIN HFA) 108 (90 Base) MCG/ACT inhaler Inhale 2 puffs into the lungs every 6 (six) hours as needed for wheezing or shortness of breath. 6.7 g 3   amLODipine (NORVASC) 5 MG tablet TAKE 1 AND 1/2 TABLETS(7.5 MG) BY MOUTH DAILY 135 tablet 1   Azelastine-Fluticasone 137-50 MCG/ACT SUSP Place 1 spray into the nose in the morning and at bedtime. 23 g 5   cyanocobalamin (VITAMIN B12) 1000 MCG/ML injection ADMINISTER 1 ML(1000 MCG) UNDER THE SKIN EVERY 14 Days 6 mL 4   DULoxetine (CYMBALTA) 60 MG capsule TAKE 1 CAPSULE(60 MG) BY MOUTH DAILY 90 capsule 0   EPINEPHrine (AUVI-Q) 0.3 mg/0.3 mL IJ SOAJ injection Inject 0.3 mg into the muscle as needed for anaphylaxis. 1 each 2   fluticasone-salmeterol (ADVAIR DISKUS) 250-50 MCG/ACT AEPB Inhale 1 puff into the lungs in the morning and at bedtime. 60 each 3   levocetirizine (XYZAL) 5 MG tablet Take 1 tablet (5 mg total) by mouth every evening. 90 tablet 1   Levonorgestrel  (MIRENA, 52 MG, IU) by Intrauterine route.     levothyroxine (SYNTHROID) 125 MCG tablet TAKE 1 TABLET(125 MCG) BY MOUTH DAILY BEFORE BREAKFAST 90 tablet 1   meloxicam (MOBIC) 7.5 MG tablet TAKE 1 TABLET(7.5 MG) BY MOUTH DAILY 30 tablet 1   montelukast (SINGULAIR) 10 MG tablet TAKE 1 TABLET(10 MG) BY MOUTH AT BEDTIME 90 tablet 0   Olopatadine HCl 0.7 % SOLN Apply  1 drop to eye daily as needed (itchy/watery eyes). 2.5 mL 5   OVER THE COUNTER MEDICATION Take 2 capsules by mouth 2 (two) times daily. TUMERIC     pantoprazole (PROTONIX) 40 MG tablet Take 1 tablet (40 mg total) by mouth daily. 30 tablet 3   Syringe/Needle, Disp, (SYRINGE 3CC/23GX1") 23G X 1" 3 ML MISC Use as directed. 12 each 0   tirzepatide (MOUNJARO) 2.5 MG/0.5ML Pen Inject 2.5 mg into the skin once a week. 2 mL 0   No facility-administered medications prior to visit.    Allergies  Allergen Reactions   Hydrocodone Nausea Only and Other (See Comments)    Hallucination    Dilaudid [Hydromorphone Hcl]    Latex Rash   Penicillins Rash and Other (See Comments)    Childhood allergy    Review of Systems  Constitutional:  Positive for fever.  HENT:  Positive for congestion.   Respiratory:  Positive for cough.   Musculoskeletal:  Positive for myalgias.       Objective:    Physical Exam  There were no vitals taken for this visit. Wt Readings from Last 3 Encounters:  06/16/22 234 lb 12.8 oz (106.5 kg)  12/04/21 234 lb (106.1 kg)  04/10/21 225 lb (102.1 kg)    Gen: Awake, alert, no acute distress- appears very tired Resp: Breathing is even and non-labored Psych: calm/pleasant demeanor Neuro: Alert and Oriented x 3, + facial symmetry, speech is clear.     Assessment & Plan:   Problem List Items Addressed This Visit       Unprioritized   COVID-19 virus infection - Primary    New.  Advised of CDC guidelines for self isolation/ ending isolation. Pt advised she may use tylenol/motrin prn pain/fever rest and  hydration.  Advised of safe practice guidelines. Symptom Tier reviewed.  Encouraged to monitor for any worsening symptoms; watch for increased shortness of breath, weakness, and signs of dehydration. Advised when to seek emergency care.  Instructed to rest and hydrate well.  Advised to leave the house during recommended isolation period, only if it is necessary to seek medical care. Pt verbalizes understanding.       No orders of the defined types were placed in this encounter.   I discussed the assessment and treatment plan with the patient. The patient was provided an opportunity to ask questions and all were answered. The patient agreed with the plan and demonstrated an understanding of the instructions.   The patient was advised to call back or seek an in-person evaluation if the symptoms worsen or if the condition fails to improve as anticipated.  I provided 20 minutes of face-to-face time during this encounter.   I,Amber Collins,acting as a Education administrator for [employer], NP.,have documented all relevant documentation on the behalf of Nance Pear, NP,as directed by  Nance Pear, NP while in the presence of Nance Pear, NP.  Nance Pear, NP [employer] [phone] (phone) [phone] (fax)  [place]

## 2022-07-06 NOTE — Telephone Encounter (Signed)
PA was approved from 06/23/2022 to 06/24/2023. Document sent to scanning

## 2022-07-06 NOTE — Assessment & Plan Note (Addendum)
New.  Advised of CDC guidelines for self isolation/ ending isolation. Pt advised she may use tylenol/motrin prn pain/fever rest and hydration.  Advised of safe practice guidelines. Symptom Tier reviewed.  Encouraged to monitor for any worsening symptoms; watch for increased shortness of breath, weakness, and signs of dehydration. Advised when to seek emergency care.  Instructed to rest and hydrate well.  Advised to leave the house during recommended isolation period, only if it is necessary to seek medical care. Pt verbalizes understanding.

## 2022-07-20 ENCOUNTER — Other Ambulatory Visit: Payer: Self-pay

## 2022-07-20 MED ORDER — FLUTICASONE-SALMETEROL 250-50 MCG/ACT IN AEPB: 1.0000 | INHALATION_SPRAY | Freq: Two times a day (BID) | RESPIRATORY_TRACT | 3 refills | Status: DC

## 2022-08-11 ENCOUNTER — Encounter: Payer: Self-pay | Admitting: Family

## 2022-08-11 DIAGNOSIS — K219 Gastro-esophageal reflux disease without esophagitis: Secondary | ICD-10-CM

## 2022-08-11 MED ORDER — LEVOCETIRIZINE DIHYDROCHLORIDE 5 MG PO TABS: 5.0000 mg | ORAL_TABLET | Freq: Every evening | ORAL | 1 refills | Status: DC

## 2022-08-20 MED ORDER — PANTOPRAZOLE SODIUM 40 MG PO TBEC: 40.0000 mg | DELAYED_RELEASE_TABLET | Freq: Every day | ORAL | 0 refills | Status: DC

## 2022-08-23 ENCOUNTER — Encounter: Payer: Self-pay | Admitting: Family

## 2022-08-23 DIAGNOSIS — M1712 Unilateral primary osteoarthritis, left knee: Secondary | ICD-10-CM | POA: Diagnosis not present

## 2022-08-26 ENCOUNTER — Other Ambulatory Visit: Payer: Self-pay | Admitting: Family

## 2022-08-26 DIAGNOSIS — E538 Deficiency of other specified B group vitamins: Secondary | ICD-10-CM

## 2022-09-07 ENCOUNTER — Other Ambulatory Visit: Payer: Self-pay | Admitting: Family

## 2022-09-08 ENCOUNTER — Encounter: Payer: Self-pay | Admitting: Family

## 2022-09-08 DIAGNOSIS — M1712 Unilateral primary osteoarthritis, left knee: Secondary | ICD-10-CM | POA: Diagnosis not present

## 2022-09-09 DIAGNOSIS — M25562 Pain in left knee: Secondary | ICD-10-CM | POA: Diagnosis not present

## 2022-09-09 DIAGNOSIS — M1712 Unilateral primary osteoarthritis, left knee: Secondary | ICD-10-CM | POA: Diagnosis not present

## 2022-09-09 DIAGNOSIS — G8929 Other chronic pain: Secondary | ICD-10-CM | POA: Diagnosis not present

## 2022-09-09 DIAGNOSIS — Z1330 Encounter for screening examination for mental health and behavioral disorders, unspecified: Secondary | ICD-10-CM | POA: Diagnosis not present

## 2022-09-29 ENCOUNTER — Other Ambulatory Visit: Payer: Self-pay | Admitting: Family

## 2022-10-14 DIAGNOSIS — M722 Plantar fascial fibromatosis: Secondary | ICD-10-CM | POA: Diagnosis not present

## 2022-10-14 DIAGNOSIS — M19071 Primary osteoarthritis, right ankle and foot: Secondary | ICD-10-CM | POA: Diagnosis not present

## 2022-10-24 ENCOUNTER — Encounter: Payer: Self-pay | Admitting: Family

## 2022-10-25 MED ORDER — MELOXICAM 7.5 MG PO TABS: ORAL_TABLET | ORAL | 0 refills | Status: DC

## 2022-11-03 ENCOUNTER — Other Ambulatory Visit: Payer: Self-pay | Admitting: Family

## 2022-11-03 DIAGNOSIS — K219 Gastro-esophageal reflux disease without esophagitis: Secondary | ICD-10-CM

## 2022-11-05 DIAGNOSIS — M722 Plantar fascial fibromatosis: Secondary | ICD-10-CM | POA: Diagnosis not present

## 2022-12-02 DIAGNOSIS — M722 Plantar fascial fibromatosis: Secondary | ICD-10-CM | POA: Diagnosis not present

## 2022-12-03 ENCOUNTER — Other Ambulatory Visit: Payer: Self-pay | Admitting: Family

## 2022-12-10 DIAGNOSIS — M1712 Unilateral primary osteoarthritis, left knee: Secondary | ICD-10-CM | POA: Diagnosis not present

## 2022-12-13 DIAGNOSIS — M25562 Pain in left knee: Secondary | ICD-10-CM | POA: Diagnosis not present

## 2022-12-21 DIAGNOSIS — M25562 Pain in left knee: Secondary | ICD-10-CM | POA: Diagnosis not present

## 2022-12-22 NOTE — Progress Notes (Signed)
Subjective:   By signing my name below, I, Doylene Bode, attest that this documentation has been prepared under the direction and in the presence of Lemont Fillers, NP 12/28/22   Patient ID: Christine [name], female    DOB: 1970-11-12, 52 y.o.   MRN: [id number]  Chief Complaint  Patient presents with   Hypothyroidism    Here for follow up    HPI Patient is in today to do labs, discuss medications.  Hypothyroidism: treated with levothyroxine 125 mcg daily before breakfast.   Vitamin B12 Deficiency: treated with Vitamin B12 1 mL every 14 days. Denies numbness, tingling.  Left knee pain: planning left knee arthroscopy in June 2024. Taking meloxicam 15 mg three times daily.  Depression: stable with duloxetine 60 mg daily.  Acid Reflux: treated with pantoprazole 40 mg daily. Has difficulty distinguishing between chest pain caused by asthma as opposed to reflux.  Asthma: treated with Advair 1 puff twice daily. Has not needed Albuterol inhaler in some time.  Allergies: treated with levocetirizine 5 mg daily in the evening.  Weight loss: Mounjaro not approved by her insurance.  Dysphagia: notes difficulty swallowing with liquids and solids recently. Feels like her neck might be swollen on the right side.  History of small nodule right thyroid lobe.  Mammogram: due for repeat screening. Colonoscopy: due for repeat screening. Vaccine counseling: deferred shingles until next visit.  Past Medical History:  Diagnosis Date   Allergy    Anemia    stated h/o pernicious anemia   Arthritis    osteoarthritis   Asthma    Acute asthmatic bronchitis 2011 while pregnant   Carpal tunnel syndrome    both wrist   Depression    Diverticulitis    recurrent   Family history of anesthesia complication    nausea and vomiting   Fibromyalgia    GERD (gastroesophageal reflux disease)    Hypothyroidism    Migraine, unspecified, without mention of intractable migraine without mention  of status migrainosus    Miscarriage 2007   PONV (postoperative nausea and vomiting)    Thyroid disease     Past Surgical History:  Procedure Laterality Date   CHOLECYSTECTOMY N/A 03/25/2014   Procedure: LAPAROSCOPIC CHOLECYSTECTOMY;  Surgeon: Axel Filler, MD;  Location: [facility];  Service: General;  Laterality: N/A;   DILATION AND CURETTAGE OF UTERUS     miscarriage   LAPAROSCOPY  5/94   x 2   SIGMOIDECTOMY     Hand-assisted laparoscopic sigmoidectomy with splenic flexure takedown.    Family History  Problem Relation Age of Onset   Barrett's esophagus Mother    Coronary artery disease Mother    Congestive Heart Failure Mother    Hypertension Mother    Fibromyalgia Mother    Chronic fatigue Mother    Anemia Mother    Irritable bowel syndrome Mother    Atrial fibrillation Mother    Colon polyps Father    Diabetes Father    Hypertension Father    Heart disease Father    Graves' disease Maternal Grandmother    CAD Maternal Grandfather    Ovarian cancer Paternal Grandmother    Diabetes Paternal Grandfather    Ehlers-Danlos syndrome Daughter    Anemia Daughter    Other Daughter        Postural orthostatic hypotension   Irritable bowel syndrome Daughter    Diabetes Paternal Aunt    Crohn's disease Cousin    Alcohol abuse Other    Arthritis Other  Hyperlipidemia Other    Hypertension Other    Stroke Other    Coronary artery disease Other    Clotting disorder Other        Great Grandfather   Colon cancer Neg Hx     Social History   Socioeconomic History   Marital status: Married    Spouse name: Not on file   Number of children: 2   Years of education: Not on file   Highest education level: Some college, no degree  Occupational History   Not on file  Tobacco Use   Smoking status: Former    Packs/day: 0.50    Years: 3.00    Additional pack years: 0.00    Total pack years: 1.50    Types: Cigarettes    Quit date: 01/31/2000    Years since quitting: 22.9    Smokeless tobacco: Never  Substance and Sexual Activity   Alcohol use: Yes    Alcohol/week: 2.0 standard drinks of alcohol    Types: 2 Glasses of wine per week    Comment: social   Drug use: No   Sexual activity: Yes    Partners: Male    Birth control/protection: I.U.D.  Other Topics Concern   Not on file  Social History Narrative   Married, 2 daughters (53 and 55) son age 27   Occupation: Housewife   Daily Caffeine Use   Patient does not get regular exercise.   Social Determinants of Health   Financial Resource Strain: Low Risk  (12/28/2022)   Overall Financial Resource Strain (CARDIA)    Difficulty of Paying Living Expenses: Not hard at all  Food Insecurity: No Food Insecurity (12/28/2022)   Hunger Vital Sign    Worried About Running Out of Food in the Last Year: Never true    Ran Out of Food in the Last Year: Never true  Transportation Needs: No Transportation Needs (12/28/2022)   PRAPARE - Administrator, Civil Service (Medical): No    Lack of Transportation (Non-Medical): No  Physical Activity: Unknown (12/28/2022)   Exercise Vital Sign    Days of Exercise per Week: 0 days    Minutes of Exercise per Session: Not on file  Stress: No Stress Concern Present (12/28/2022)   [employer] of Occupational Health - Occupational Stress Questionnaire    Feeling of Stress : Only a little  Social Connections: Socially Integrated (12/28/2022)   Social Connection and Isolation Panel [NHANES]    Frequency of Communication with Friends and Family: More than three times a week    Frequency of Social Gatherings with Friends and Family: Once a week    Attends Religious Services: More than 4 times per year    Active Member of [employer] or Organizations: Yes    Attends Engineer, structural: More than 4 times per year    Marital Status: Married  Catering manager Violence: Not on file    Outpatient Medications Prior to Visit  Medication Sig Dispense Refill   EPINEPHrine  (AUVI-Q) 0.3 mg/0.3 mL IJ SOAJ injection Inject 0.3 mg into the muscle as needed for anaphylaxis. 1 each 2   Levonorgestrel (MIRENA, 52 MG, IU) by Intrauterine route.     meloxicam (MOBIC) 15 MG tablet Take 15 mg by mouth 3 (three) times daily.     Olopatadine HCl 0.7 % SOLN Apply 1 drop to eye daily as needed (itchy/watery eyes). 2.5 mL 5   SYRINGE-NEEDLE, DISP, 3 ML (BD [employer]TE[employer]RA SYRINGE) 25G X 1" 3  ML MISC USE AS DIRECTED 12 each 0   albuterol (VENTOLIN HFA) 108 (90 Base) MCG/ACT inhaler Inhale 2 puffs into the lungs every 6 (six) hours as needed for wheezing or shortness of breath. 6.7 g 3   amLODipine (NORVASC) 5 MG tablet TAKE 1 AND 1/2 TABLETS(7.5 MG) BY MOUTH DAILY 135 tablet 1   Azelastine-Fluticasone 137-50 MCG/ACT SUSP Place 1 spray into the nose in the morning and at bedtime. 23 g 5   cyanocobalamin (VITAMIN B12) 1000 MCG/ML injection ADMINISTER 1 ML(1000 MCG) UNDER THE SKIN EVERY 14 Days 6 mL 4   DULoxetine (CYMBALTA) 60 MG capsule TAKE 1 CAPSULE(60 MG) BY MOUTH DAILY 90 capsule 0   fluticasone-salmeterol (ADVAIR DISKUS) 250-50 MCG/ACT AEPB Inhale 1 puff into the lungs in the morning and at bedtime. 60 each 3   levocetirizine (XYZAL) 5 MG tablet Take 1 tablet (5 mg total) by mouth every evening. 90 tablet 1   levothyroxine (SYNTHROID) 125 MCG tablet TAKE 1 TABLET(125 MCG) BY MOUTH DAILY BEFORE BREAKFAST 90 tablet 1   montelukast (SINGULAIR) 10 MG tablet TAKE 1 TABLET(10 MG) BY MOUTH AT BEDTIME 90 tablet 0   pantoprazole (PROTONIX) 40 MG tablet TAKE 1 TABLET(40 MG) BY MOUTH DAILY 90 tablet 0   meloxicam (MOBIC) 7.5 MG tablet TAKE 1 TABLET(7.5 MG) BY MOUTH DAILY 30 tablet 0   OVER THE COUNTER MEDICATION Take 2 capsules by mouth 2 (two) times daily. TUMERIC     tirzepatide (MOUNJARO) 2.5 MG/0.5ML Pen Inject 2.5 mg into the skin once a week. 2 mL 0   No facility-administered medications prior to visit.    Allergies  Allergen Reactions   Hydrocodone Nausea Only and Other (See Comments)     Hallucination    Dilaudid [Hydromorphone Hcl]    Latex Rash   Penicillins Rash and Other (See Comments)    Childhood allergy    Review of Systems  Constitutional:  Negative for fever and malaise/fatigue.  HENT:  Negative for congestion.        (+) Dysphagia  Eyes:  Negative for blurred vision.  Respiratory:  Negative for cough and shortness of breath.   Cardiovascular:  Negative for chest pain, palpitations and leg swelling.  Gastrointestinal:  Negative for vomiting.  Musculoskeletal:  Positive for joint pain (Left knee). Negative for back pain.  Skin:  Negative for rash.  Neurological:  Negative for loss of consciousness and headaches.       Objective:    Physical Exam Vitals and nursing note reviewed.  Constitutional:      General: She is not in acute distress.    Appearance: She is well-developed. She is not ill-appearing or toxic-appearing.  Neck:     Comments: Right thyroid fullness. Cardiovascular:     Rate and Rhythm: Normal rate and regular rhythm. No extrasystoles are present.    Pulses: Normal pulses.     Heart sounds: Normal heart sounds, S1 normal and S2 normal. No murmur heard.    No friction rub. No gallop.  Pulmonary:     Effort: Pulmonary effort is normal.     Breath sounds: Normal breath sounds.  Skin:    General: Skin is warm and dry.  Neurological:     Mental Status: She is alert.     GCS: GCS eye subscore is 4. GCS verbal subscore is 5. GCS motor subscore is 6.  Psychiatric:        Speech: Speech normal.        Behavior: Behavior normal.  Behavior is cooperative.     BP 137/65 (BP Location: Right Arm, Patient Position: Sitting, Cuff Size: Large)   Pulse 84   Temp 98.2 F (36.8 C) (Oral)   Resp 16   Wt 239 lb (108.4 kg)   SpO2 99%   BMI 35.29 kg/m  Wt Readings from Last 3 Encounters:  12/28/22 239 lb (108.4 kg)  06/16/22 234 lb 12.8 oz (106.5 kg)  12/04/21 234 lb (106.1 kg)       Assessment & Plan:  B12 deficiency Assessment &  Plan: Overall stable on b12 injections every 14 days.  Continue same.   Orders: -     Vitamin B12 -     Cyanocobalamin; ADMINISTER 1 ML(1000 MCG) UNDER THE SKIN EVERY 14 Days  Dispense: 6 mL; Refill: 4  Depression, unspecified depression type Assessment & Plan: Reports that her mood is stable on cymbalta.   Orders: -     DULoxetine HCl; Take 1 capsule (60 mg total) by mouth daily.  Dispense: 90 capsule; Refill: 1  Gastroesophageal reflux disease, unspecified whether esophagitis present Assessment & Plan: Continues pantoprazole.  Fair control.     Orders: -     Pantoprazole Sodium; TAKE 1 TABLET(40 MG) BY MOUTH DAILY  Dispense: 90 tablet; Refill: 0  Moderate persistent asthma without complication Assessment & Plan: Stable on advair and singulair. Continue same.   Orders: -     Albuterol Sulfate HFA; Inhale 2 puffs into the lungs every 6 (six) hours as needed for wheezing or shortness of breath.  Dispense: 6.7 g; Refill: 3 -     Montelukast Sodium; TAKE 1 TABLET(10 MG) BY MOUTH AT BEDTIME  Dispense: 90 tablet; Refill: 1 -     Fluticasone-Salmeterol; Inhale 1 puff into the lungs in the morning and at bedtime.  Dispense: 60 each; Refill: 3  Obesity (BMI 30.0-34.9) Assessment & Plan: Insurance did not cover mounjaro. Unfortunately, her exercise has been limited by her left knee pain.   Orders: -     Comprehensive metabolic panel  Screening for colon cancer -     Ambulatory referral to Gastroenterology  Encounter for screening for HIV -     HIV Antibody (routine testing w rflx)  Encounter for hepatitis C screening test for low risk patient -     Hepatitis C antibody  Hypothyroidism, unspecified type -     TSH -     Levothyroxine Sodium; Take 1 tablet (125 mcg total) by mouth daily before breakfast.  Dispense: 90 tablet; Refill: 1  Thyromegaly -     US THYROID; Future  Seasonal allergies Assessment & Plan: Stable on xyzal, singulair, azelastine-fluticasone spray.    Orders: -     Azelastine-Fluticasone; Place 1 spray into the nose in the morning and at bedtime.  Dispense: 23 g; Refill: 5 -     Levocetirizine Dihydrochloride; Take 1 tablet (5 mg total) by mouth every evening.  Dispense: 90 tablet; Refill: 1  Primary hypertension Assessment & Plan: BP acceptable, continue amlodipine 5mg  once daily.   Orders: -     amLODIPine Besylate; TAKE 1 AND 1/2 TABLETS(7.5 MG) BY MOUTH DAILY  Dispense: 135 tablet; Refill: 1    I,Alexander Ruley,acting as a scribe for [employer], NP.,have documented all relevant documentation on the behalf of Lemont Fillers, NP,as directed by  Lemont Fillers, NP while in the presence of Lemont Fillers, NP.

## 2022-12-28 ENCOUNTER — Ambulatory Visit (INDEPENDENT_AMBULATORY_CARE_PROVIDER_SITE_OTHER): Payer: BC Managed Care – PPO | Admitting: Family

## 2022-12-28 ENCOUNTER — Telehealth: Payer: Self-pay | Admitting: Family

## 2022-12-28 VITALS — BP 137/65 | HR 84 | Temp 98.2°F | Resp 16 | Wt 239.0 lb

## 2022-12-28 DIAGNOSIS — Z1159 Encounter for screening for other viral diseases: Secondary | ICD-10-CM

## 2022-12-28 DIAGNOSIS — E669 Obesity, unspecified: Secondary | ICD-10-CM

## 2022-12-28 DIAGNOSIS — Z114 Encounter for screening for human immunodeficiency virus [HIV]: Secondary | ICD-10-CM | POA: Diagnosis not present

## 2022-12-28 DIAGNOSIS — F32A Depression, unspecified: Secondary | ICD-10-CM | POA: Diagnosis not present

## 2022-12-28 DIAGNOSIS — Z1211 Encounter for screening for malignant neoplasm of colon: Secondary | ICD-10-CM

## 2022-12-28 DIAGNOSIS — E039 Hypothyroidism, unspecified: Secondary | ICD-10-CM | POA: Diagnosis not present

## 2022-12-28 DIAGNOSIS — K219 Gastro-esophageal reflux disease without esophagitis: Secondary | ICD-10-CM | POA: Diagnosis not present

## 2022-12-28 DIAGNOSIS — J4540 Moderate persistent asthma, uncomplicated: Secondary | ICD-10-CM | POA: Diagnosis not present

## 2022-12-28 DIAGNOSIS — E538 Deficiency of other specified B group vitamins: Secondary | ICD-10-CM | POA: Diagnosis not present

## 2022-12-28 DIAGNOSIS — I10 Essential (primary) hypertension: Secondary | ICD-10-CM

## 2022-12-28 DIAGNOSIS — E010 Iodine-deficiency related diffuse (endemic) goiter: Secondary | ICD-10-CM

## 2022-12-28 DIAGNOSIS — J302 Other seasonal allergic rhinitis: Secondary | ICD-10-CM

## 2022-12-28 LAB — COMPREHENSIVE METABOLIC PANEL
ALT: 21 U/L (ref 0–35)
AST: 15 U/L (ref 0–37)
Albumin: 4.3 g/dL (ref 3.5–5.2)
Alkaline Phosphatase: 82 U/L (ref 39–117)
BUN: 11 mg/dL (ref 6–23)
CO2: 25 mEq/L (ref 19–32)
Calcium: 9.6 mg/dL (ref 8.4–10.5)
Chloride: 102 mEq/L (ref 96–112)
Creatinine, Ser: 0.73 mg/dL (ref 0.40–1.20)
GFR: 95.54 mL/min (ref >60.00)
Glucose, Bld: 112 mg/dL — ABNORMAL HIGH (ref 70–99)
Potassium: 4.2 mEq/L (ref 3.5–5.1)
Sodium: 137 mEq/L (ref 135–145)
Total Bilirubin: 0.4 mg/dL (ref 0.2–1.2)
Total Protein: 7 g/dL (ref 6.0–8.3)

## 2022-12-28 LAB — VITAMIN B12: Vitamin B-12: 380 pg/mL (ref 211–911)

## 2022-12-28 LAB — TSH: TSH: 0.98 u[IU]/mL (ref 0.35–5.50)

## 2022-12-28 MED ORDER — PANTOPRAZOLE SODIUM 40 MG PO TBEC: DELAYED_RELEASE_TABLET | ORAL | 0 refills | Status: DC

## 2022-12-28 MED ORDER — FLUTICASONE-SALMETEROL 250-50 MCG/ACT IN AEPB
1.0000 | INHALATION_SPRAY | Freq: Two times a day (BID) | RESPIRATORY_TRACT | 3 refills | Status: DC
Start: 2022-12-28 — End: 2024-05-09

## 2022-12-28 MED ORDER — LEVOCETIRIZINE DIHYDROCHLORIDE 5 MG PO TABS
5.0000 mg | ORAL_TABLET | Freq: Every evening | ORAL | 1 refills | Status: DC
Start: 2022-12-28 — End: 2023-07-08

## 2022-12-28 MED ORDER — CYANOCOBALAMIN 1000 MCG/ML IJ SOLN
INTRAMUSCULAR | 4 refills | Status: DC
Start: 2022-12-28 — End: 2023-12-29

## 2022-12-28 MED ORDER — AZELASTINE-FLUTICASONE 137-50 MCG/ACT NA SUSP
1.0000 | Freq: Two times a day (BID) | NASAL | 5 refills | Status: DC
Start: 2022-12-28 — End: 2023-06-27

## 2022-12-28 MED ORDER — LEVOTHYROXINE SODIUM 125 MCG PO TABS
125.0000 ug | ORAL_TABLET | Freq: Every day | ORAL | 1 refills | Status: DC
Start: 2022-12-28 — End: 2023-06-27

## 2022-12-28 MED ORDER — ALBUTEROL SULFATE HFA 108 (90 BASE) MCG/ACT IN AERS
2.0000 | INHALATION_SPRAY | Freq: Four times a day (QID) | RESPIRATORY_TRACT | 3 refills | Status: AC | PRN
Start: 2022-12-28 — End: ?

## 2022-12-28 MED ORDER — DULOXETINE HCL 60 MG PO CPEP: 60.0000 mg | ORAL_CAPSULE | Freq: Every day | ORAL | 1 refills | Status: DC

## 2022-12-28 MED ORDER — MONTELUKAST SODIUM 10 MG PO TABS
ORAL_TABLET | ORAL | 1 refills | Status: DC
Start: 2022-12-28 — End: 2023-06-24

## 2022-12-28 MED ORDER — AMLODIPINE BESYLATE 5 MG PO TABS
ORAL_TABLET | ORAL | 1 refills | Status: DC
Start: 2022-12-28 — End: 2023-06-27

## 2022-12-28 NOTE — Assessment & Plan Note (Signed)
Stable on xyzal, singulair, azelastine-fluticasone spray.

## 2022-12-28 NOTE — Telephone Encounter (Signed)
Electronic request made 

## 2022-12-28 NOTE — Assessment & Plan Note (Signed)
Reports that her mood is stable on cymbalta. 

## 2022-12-28 NOTE — Assessment & Plan Note (Signed)
Stable on advair and singulair. Continue same.  

## 2022-12-28 NOTE — Assessment & Plan Note (Addendum)
Insurance did not cover mounjaro. Unfortunately, her exercise has been limited by her left knee pain.

## 2022-12-28 NOTE — Telephone Encounter (Signed)
Please call [facility] and request copy of mammogram.

## 2022-12-28 NOTE — Assessment & Plan Note (Signed)
Overall stable on b12 injections every 14 days.  Continue same.

## 2022-12-28 NOTE — Assessment & Plan Note (Signed)
BP acceptable, continue amlodipine 5mg  once daily.

## 2022-12-28 NOTE — Assessment & Plan Note (Signed)
Continues pantoprazole.  Fair control.

## 2022-12-29 LAB — HIV ANTIBODY (ROUTINE TESTING W REFLEX): HIV 1&2 Ab, 4th Generation: NONREACTIVE

## 2022-12-29 LAB — HEPATITIS C ANTIBODY: Hepatitis C Ab: NONREACTIVE

## 2023-01-07 ENCOUNTER — Ambulatory Visit (HOSPITAL_BASED_OUTPATIENT_CLINIC_OR_DEPARTMENT_OTHER)
Admission: RE | Admit: 2023-01-07 | Discharge: 2023-01-07 | Disposition: A | Discharge status: Home / Self Care | Payer: BC Managed Care – PPO | Source: Ambulatory Visit | Attending: Family | Admitting: Family

## 2023-01-07 DIAGNOSIS — E010 Iodine-deficiency related diffuse (endemic) goiter: Secondary | ICD-10-CM | POA: Diagnosis not present

## 2023-01-24 DIAGNOSIS — M2242 Chondromalacia patellae, left knee: Secondary | ICD-10-CM | POA: Diagnosis not present

## 2023-01-24 DIAGNOSIS — Y999 Unspecified external cause status: Secondary | ICD-10-CM | POA: Diagnosis not present

## 2023-01-24 DIAGNOSIS — S83282A Other tear of lateral meniscus, current injury, left knee, initial encounter: Secondary | ICD-10-CM | POA: Diagnosis not present

## 2023-01-24 DIAGNOSIS — M65862 Other synovitis and tenosynovitis, left lower leg: Secondary | ICD-10-CM | POA: Diagnosis not present

## 2023-01-24 DIAGNOSIS — S83232A Complex tear of medial meniscus, current injury, left knee, initial encounter: Secondary | ICD-10-CM | POA: Diagnosis not present

## 2023-01-24 DIAGNOSIS — M948X6 Other specified disorders of cartilage, lower leg: Secondary | ICD-10-CM | POA: Diagnosis not present

## 2023-01-24 DIAGNOSIS — M1712 Unilateral primary osteoarthritis, left knee: Secondary | ICD-10-CM | POA: Diagnosis not present

## 2023-01-24 DIAGNOSIS — M23242 Derangement of anterior horn of lateral meniscus due to old tear or injury, left knee: Secondary | ICD-10-CM | POA: Diagnosis not present

## 2023-01-24 DIAGNOSIS — G8918 Other acute postprocedural pain: Secondary | ICD-10-CM | POA: Diagnosis not present

## 2023-01-24 DIAGNOSIS — X58XXXA Exposure to other specified factors, initial encounter: Secondary | ICD-10-CM | POA: Diagnosis not present

## 2023-01-24 DIAGNOSIS — M659 Synovitis and tenosynovitis, unspecified: Secondary | ICD-10-CM | POA: Diagnosis not present

## 2023-02-14 DIAGNOSIS — M25562 Pain in left knee: Secondary | ICD-10-CM | POA: Diagnosis not present

## 2023-02-14 DIAGNOSIS — R2689 Other abnormalities of gait and mobility: Secondary | ICD-10-CM | POA: Diagnosis not present

## 2023-02-14 DIAGNOSIS — M25662 Stiffness of left knee, not elsewhere classified: Secondary | ICD-10-CM | POA: Diagnosis not present

## 2023-02-14 DIAGNOSIS — Z96651 Presence of right artificial knee joint: Secondary | ICD-10-CM | POA: Diagnosis not present

## 2023-02-18 DIAGNOSIS — M25662 Stiffness of left knee, not elsewhere classified: Secondary | ICD-10-CM | POA: Diagnosis not present

## 2023-02-18 DIAGNOSIS — Z96651 Presence of right artificial knee joint: Secondary | ICD-10-CM | POA: Diagnosis not present

## 2023-02-18 DIAGNOSIS — R2689 Other abnormalities of gait and mobility: Secondary | ICD-10-CM | POA: Diagnosis not present

## 2023-02-18 DIAGNOSIS — M25562 Pain in left knee: Secondary | ICD-10-CM | POA: Diagnosis not present

## 2023-02-21 DIAGNOSIS — M25662 Stiffness of left knee, not elsewhere classified: Secondary | ICD-10-CM | POA: Diagnosis not present

## 2023-02-21 DIAGNOSIS — M25562 Pain in left knee: Secondary | ICD-10-CM | POA: Diagnosis not present

## 2023-02-21 DIAGNOSIS — R2689 Other abnormalities of gait and mobility: Secondary | ICD-10-CM | POA: Diagnosis not present

## 2023-02-21 DIAGNOSIS — Z96651 Presence of right artificial knee joint: Secondary | ICD-10-CM | POA: Diagnosis not present

## 2023-02-23 DIAGNOSIS — Z96651 Presence of right artificial knee joint: Secondary | ICD-10-CM | POA: Diagnosis not present

## 2023-02-23 DIAGNOSIS — M25662 Stiffness of left knee, not elsewhere classified: Secondary | ICD-10-CM | POA: Diagnosis not present

## 2023-02-23 DIAGNOSIS — M25562 Pain in left knee: Secondary | ICD-10-CM | POA: Diagnosis not present

## 2023-02-23 DIAGNOSIS — R2689 Other abnormalities of gait and mobility: Secondary | ICD-10-CM | POA: Diagnosis not present

## 2023-03-01 DIAGNOSIS — R2689 Other abnormalities of gait and mobility: Secondary | ICD-10-CM | POA: Diagnosis not present

## 2023-03-01 DIAGNOSIS — M25562 Pain in left knee: Secondary | ICD-10-CM | POA: Diagnosis not present

## 2023-03-01 DIAGNOSIS — M25662 Stiffness of left knee, not elsewhere classified: Secondary | ICD-10-CM | POA: Diagnosis not present

## 2023-03-01 DIAGNOSIS — Z96651 Presence of right artificial knee joint: Secondary | ICD-10-CM | POA: Diagnosis not present

## 2023-03-03 DIAGNOSIS — M25662 Stiffness of left knee, not elsewhere classified: Secondary | ICD-10-CM | POA: Diagnosis not present

## 2023-03-03 DIAGNOSIS — M25562 Pain in left knee: Secondary | ICD-10-CM | POA: Diagnosis not present

## 2023-03-03 DIAGNOSIS — R2689 Other abnormalities of gait and mobility: Secondary | ICD-10-CM | POA: Diagnosis not present

## 2023-03-03 DIAGNOSIS — Z96651 Presence of right artificial knee joint: Secondary | ICD-10-CM | POA: Diagnosis not present

## 2023-03-08 DIAGNOSIS — M25562 Pain in left knee: Secondary | ICD-10-CM | POA: Diagnosis not present

## 2023-03-08 DIAGNOSIS — M25662 Stiffness of left knee, not elsewhere classified: Secondary | ICD-10-CM | POA: Diagnosis not present

## 2023-03-08 DIAGNOSIS — Z96651 Presence of right artificial knee joint: Secondary | ICD-10-CM | POA: Diagnosis not present

## 2023-03-08 DIAGNOSIS — R2689 Other abnormalities of gait and mobility: Secondary | ICD-10-CM | POA: Diagnosis not present

## 2023-03-10 DIAGNOSIS — Z4889 Encounter for other specified surgical aftercare: Secondary | ICD-10-CM | POA: Diagnosis not present

## 2023-03-10 DIAGNOSIS — M1712 Unilateral primary osteoarthritis, left knee: Secondary | ICD-10-CM | POA: Diagnosis not present

## 2023-03-15 DIAGNOSIS — Z96651 Presence of right artificial knee joint: Secondary | ICD-10-CM | POA: Diagnosis not present

## 2023-03-15 DIAGNOSIS — M25662 Stiffness of left knee, not elsewhere classified: Secondary | ICD-10-CM | POA: Diagnosis not present

## 2023-03-15 DIAGNOSIS — M25562 Pain in left knee: Secondary | ICD-10-CM | POA: Diagnosis not present

## 2023-03-15 DIAGNOSIS — R2689 Other abnormalities of gait and mobility: Secondary | ICD-10-CM | POA: Diagnosis not present

## 2023-03-17 DIAGNOSIS — Z96651 Presence of right artificial knee joint: Secondary | ICD-10-CM | POA: Diagnosis not present

## 2023-03-17 DIAGNOSIS — R2689 Other abnormalities of gait and mobility: Secondary | ICD-10-CM | POA: Diagnosis not present

## 2023-03-17 DIAGNOSIS — M25662 Stiffness of left knee, not elsewhere classified: Secondary | ICD-10-CM | POA: Diagnosis not present

## 2023-03-17 DIAGNOSIS — M25562 Pain in left knee: Secondary | ICD-10-CM | POA: Diagnosis not present

## 2023-03-22 DIAGNOSIS — M25662 Stiffness of left knee, not elsewhere classified: Secondary | ICD-10-CM | POA: Diagnosis not present

## 2023-03-22 DIAGNOSIS — Z96651 Presence of right artificial knee joint: Secondary | ICD-10-CM | POA: Diagnosis not present

## 2023-03-22 DIAGNOSIS — M25562 Pain in left knee: Secondary | ICD-10-CM | POA: Diagnosis not present

## 2023-03-22 DIAGNOSIS — R2689 Other abnormalities of gait and mobility: Secondary | ICD-10-CM | POA: Diagnosis not present

## 2023-03-24 DIAGNOSIS — Z96651 Presence of right artificial knee joint: Secondary | ICD-10-CM | POA: Diagnosis not present

## 2023-03-24 DIAGNOSIS — M25662 Stiffness of left knee, not elsewhere classified: Secondary | ICD-10-CM | POA: Diagnosis not present

## 2023-03-24 DIAGNOSIS — R2689 Other abnormalities of gait and mobility: Secondary | ICD-10-CM | POA: Diagnosis not present

## 2023-03-24 DIAGNOSIS — M25562 Pain in left knee: Secondary | ICD-10-CM | POA: Diagnosis not present

## 2023-03-25 DIAGNOSIS — M25562 Pain in left knee: Secondary | ICD-10-CM | POA: Diagnosis not present

## 2023-03-29 DIAGNOSIS — Z96651 Presence of right artificial knee joint: Secondary | ICD-10-CM | POA: Diagnosis not present

## 2023-03-29 DIAGNOSIS — R2689 Other abnormalities of gait and mobility: Secondary | ICD-10-CM | POA: Diagnosis not present

## 2023-03-29 DIAGNOSIS — M25662 Stiffness of left knee, not elsewhere classified: Secondary | ICD-10-CM | POA: Diagnosis not present

## 2023-03-29 DIAGNOSIS — M25562 Pain in left knee: Secondary | ICD-10-CM | POA: Diagnosis not present

## 2023-03-31 DIAGNOSIS — Z96651 Presence of right artificial knee joint: Secondary | ICD-10-CM | POA: Diagnosis not present

## 2023-03-31 DIAGNOSIS — R2689 Other abnormalities of gait and mobility: Secondary | ICD-10-CM | POA: Diagnosis not present

## 2023-03-31 DIAGNOSIS — M25562 Pain in left knee: Secondary | ICD-10-CM | POA: Diagnosis not present

## 2023-03-31 DIAGNOSIS — M25662 Stiffness of left knee, not elsewhere classified: Secondary | ICD-10-CM | POA: Diagnosis not present

## 2023-04-05 DIAGNOSIS — Z96651 Presence of right artificial knee joint: Secondary | ICD-10-CM | POA: Diagnosis not present

## 2023-04-05 DIAGNOSIS — R2689 Other abnormalities of gait and mobility: Secondary | ICD-10-CM | POA: Diagnosis not present

## 2023-04-05 DIAGNOSIS — M25662 Stiffness of left knee, not elsewhere classified: Secondary | ICD-10-CM | POA: Diagnosis not present

## 2023-04-05 DIAGNOSIS — M25562 Pain in left knee: Secondary | ICD-10-CM | POA: Diagnosis not present

## 2023-04-10 ENCOUNTER — Encounter: Payer: Self-pay | Admitting: Family

## 2023-04-10 DIAGNOSIS — K219 Gastro-esophageal reflux disease without esophagitis: Secondary | ICD-10-CM

## 2023-04-11 MED ORDER — PANTOPRAZOLE SODIUM 40 MG PO TBEC
40.0000 mg | DELAYED_RELEASE_TABLET | Freq: Every day | ORAL | 0 refills | Status: DC
Start: 2023-04-11 — End: 2023-07-04

## 2023-05-06 ENCOUNTER — Other Ambulatory Visit: Payer: Self-pay | Admitting: Family

## 2023-05-06 DIAGNOSIS — Z1212 Encounter for screening for malignant neoplasm of rectum: Secondary | ICD-10-CM

## 2023-05-06 DIAGNOSIS — Z1211 Encounter for screening for malignant neoplasm of colon: Secondary | ICD-10-CM

## 2023-05-16 ENCOUNTER — Ambulatory Visit (INDEPENDENT_AMBULATORY_CARE_PROVIDER_SITE_OTHER): Payer: BC Managed Care – PPO | Admitting: Family Medicine

## 2023-05-16 ENCOUNTER — Ambulatory Visit (HOSPITAL_BASED_OUTPATIENT_CLINIC_OR_DEPARTMENT_OTHER)
Admission: RE | Admit: 2023-05-16 | Discharge: 2023-05-16 | Disposition: A | Discharge status: Home / Self Care | Payer: BC Managed Care – PPO | Source: Ambulatory Visit | Attending: Family Medicine | Admitting: Family Medicine

## 2023-05-16 VITALS — BP 140/79 | HR 100 | Temp 98.8°F | Resp 24 | Ht 69.0 in | Wt 232.6 lb

## 2023-05-16 DIAGNOSIS — R051 Acute cough: Secondary | ICD-10-CM | POA: Diagnosis not present

## 2023-05-16 DIAGNOSIS — R5381 Other malaise: Secondary | ICD-10-CM | POA: Diagnosis not present

## 2023-05-16 DIAGNOSIS — J984 Other disorders of lung: Secondary | ICD-10-CM | POA: Diagnosis not present

## 2023-05-16 DIAGNOSIS — J189 Pneumonia, unspecified organism: Secondary | ICD-10-CM | POA: Diagnosis not present

## 2023-05-16 LAB — POC COVID19 BINAXNOW: SARS Coronavirus 2 Ag: NEGATIVE

## 2023-05-16 MED ORDER — PROMETHAZINE-DM 6.25-15 MG/5ML PO SYRP
5.0000 mL | ORAL_SOLUTION | Freq: Four times a day (QID) | ORAL | 0 refills | Status: DC | PRN
Start: 2023-05-16 — End: 2023-06-01

## 2023-05-16 MED ORDER — DOXYCYCLINE HYCLATE 100 MG PO TABS
100.0000 mg | ORAL_TABLET | Freq: Two times a day (BID) | ORAL | 0 refills | Status: AC
Start: 2023-05-16 — End: 2023-05-26

## 2023-05-16 NOTE — Progress Notes (Signed)
Acute Office Visit  Subjective:     Patient ID: Christine [name], female    DOB: Dec 20, 1970, 52 y.o.   MRN: [id number]  Chief Complaint  Patient presents with   Cough    Cough x 7 days Productive cough since Friday Tested Negative for Covid last Thursday     Patient is in today for cough, URI  Discussed the use of AI scribe software for clinical note transcription with the patient, who gave verbal consent to proceed.  History of Present Illness   The patient, with a history of hypertension, asthma, and allergies, presents with a 4-5 day history of feeling unwell. The illness onset was characterized by fatigue and a migraine, which was initially thought to be isolated. However, by the third day, the patient experienced a sudden onset of fever, body aches, and a feeling of being 'hit by a truck.' The fever was associated with chills and night sweats, which were severe for the first three days and then limited to the night time.  The patient also developed a cough, initially dry, which has since become productive with thick, yellow-green phlegm that she struggles to expectorate. The cough is associated with a sensation of pressure in the head and ears, and a feeling of breathlessness, particularly when the coughing is severe. The patient denies any chest pain but reports backache, particularly in the upper right and lower left regions.  The patient has been managing the symptoms with Tylenol, taken around the clock. The patient reported a brief period of feeling better, but the cough has since worsened. The patient also reports a sensation of wheezing, particularly when lying down, and a 'crackly' sound on exhaling. The patient has not used albuterol, but has been using Wixela more regularly due to a sensation of tightness and cough.  The patient denies any new medications and reports adherence to current medications for hypertension, allergies, and asthma. The patient has a known allergy  to penicillin and has previously been prescribed prednisone for asthma flares, but prefers not to take it. The patient has not checked her temperature due to a faulty thermometer but is certain of having a fever due to the associated symptoms.            All review of systems negative except what is listed in the HPI      Objective:    BP (!) 140/79   Pulse 100   Temp 98.8 F (37.1 C) (Oral)   Resp (!) 24   Ht 5\' 9"  (1.753 m)   Wt 232 lb 9.6 oz (105.5 kg)   SpO2 98%   BMI 34.35 kg/m    Physical Exam Vitals reviewed.  Constitutional:      Appearance: Normal appearance.  Cardiovascular:     Rate and Rhythm: Normal rate and regular rhythm.     Heart sounds: Normal heart sounds.  Pulmonary:     Effort: Pulmonary effort is normal.     Breath sounds: Wheezing and rhonchi present.  Skin:    General: Skin is warm and dry.  Neurological:     Mental Status: She is alert and oriented to person, place, and time.  Psychiatric:        Mood and Affect: Mood normal.        Behavior: Behavior normal.        Thought Content: Thought content normal.        Judgment: Judgment normal.     Results for orders placed or performed  in visit on 05/16/23  POC COVID-19 BinaxNow  Result Value Ref Range   SARS Coronavirus 2 Ag Negative Negative        Assessment & Plan:   Problem List Items Addressed This Visit   None Visit Diagnoses     Acute cough    -  Primary   Relevant Medications   doxycycline (VIBRA-TABS) 100 MG tablet   promethazine-dextromethorphan (PROMETHAZINE-DM) 6.25-15 MG/5ML syrup   Other Relevant Orders   POC COVID-19 BinaxNow (Completed)   DG Chest 2 View         Respiratory Infection Productive cough with yellow-green phlegm, fever, chills, body aches, fatigue, and headache. Symptoms started with fatigue and migraine, then progressed to fever and body aches. Cough has become more productive over time. Wheezing noted on examination. No chest pain, but some  back discomfort likely due to coughing. COVID negative today. -Order chest x-ray -Continue use of Wixela/Advair inhalers. -Consider use of albuterol if feeling wheezy or tight. -Prescribe Doxycycline as a watch and wait antibiotic. If no improvement in 2 days, start antibiotic. -Prescribe Promethazine DM for cough, to be used at bedtime. -Advise to purchase over-the-counter Mucinex to help break up phlegm.  Hypertension Blood pressure slightly elevated, likely due to current illness. -Advise patient to monitor blood pressure once feeling better and schedule follow-up if consistently above 140/90.     Meds ordered this encounter  Medications   doxycycline (VIBRA-TABS) 100 MG tablet    Sig: Take 1 tablet (100 mg total) by mouth 2 (two) times daily for 10 days.    Dispense:  20 tablet    Refill:  0    Order Specific Question:   Supervising Provider    Answer:   Danise Edge A [[id number]]   promethazine-dextromethorphan (PROMETHAZINE-DM) 6.25-15 MG/5ML syrup    Sig: Take 5 mLs by mouth 4 (four) times daily as needed for cough.    Dispense:  118 mL    Refill:  0    Order Specific Question:   Supervising Provider    Answer:   Danise Edge A [[id number]]    Return if symptoms worsen or fail to improve.  Clayborne Dana, NP

## 2023-05-16 NOTE — Patient Instructions (Addendum)
COVID negative CXR today Adding cough syrup Start Mucinex, can add Delsym if needed Use your inhalers as prescribed Continue supportive measures including rest, hydration, humidifier use, steam showers, warm compresses to sinuses, warm liquids with lemon and honey, and over-the-counter cough, cold, and analgesics as needed.   If not improving in the next 2 days, start the antibiotic (doxycycline) - we will notify you if chest xray results change our plan.   Hope you feel better soon!!

## 2023-05-16 NOTE — Progress Notes (Signed)
Chest xray suggests pneumonia. Go ahead and start the doxycycline I gave you - this should take care of it. Continue the supportive measures we discussed and follow-up if not improving. Hope you feel better soon!!

## 2023-05-17 ENCOUNTER — Ambulatory Visit: Payer: BC Managed Care – PPO | Admitting: Physician Assistant

## 2023-05-24 DIAGNOSIS — M170 Bilateral primary osteoarthritis of knee: Secondary | ICD-10-CM | POA: Diagnosis not present

## 2023-06-01 ENCOUNTER — Ambulatory Visit (HOSPITAL_BASED_OUTPATIENT_CLINIC_OR_DEPARTMENT_OTHER)
Admission: RE | Admit: 2023-06-01 | Discharge: 2023-06-01 | Disposition: A | Discharge status: Home / Self Care | Payer: BC Managed Care – PPO | Source: Ambulatory Visit | Attending: Family | Admitting: Family

## 2023-06-01 ENCOUNTER — Telehealth: Payer: Self-pay | Admitting: Family

## 2023-06-01 ENCOUNTER — Ambulatory Visit: Payer: BC Managed Care – PPO | Admitting: Family

## 2023-06-01 VITALS — BP 126/58 | HR 82 | Temp 98.6°F | Resp 16 | Ht 69.0 in | Wt 236.0 lb

## 2023-06-01 DIAGNOSIS — I10 Essential (primary) hypertension: Secondary | ICD-10-CM | POA: Diagnosis not present

## 2023-06-01 DIAGNOSIS — Z23 Encounter for immunization: Secondary | ICD-10-CM

## 2023-06-01 DIAGNOSIS — Z01818 Encounter for other preprocedural examination: Secondary | ICD-10-CM

## 2023-06-01 DIAGNOSIS — Z0000 Encounter for general adult medical examination without abnormal findings: Secondary | ICD-10-CM

## 2023-06-01 DIAGNOSIS — E785 Hyperlipidemia, unspecified: Secondary | ICD-10-CM

## 2023-06-01 DIAGNOSIS — R9431 Abnormal electrocardiogram [ECG] [EKG]: Secondary | ICD-10-CM

## 2023-06-01 DIAGNOSIS — E039 Hypothyroidism, unspecified: Secondary | ICD-10-CM

## 2023-06-01 DIAGNOSIS — R918 Other nonspecific abnormal finding of lung field: Secondary | ICD-10-CM | POA: Diagnosis not present

## 2023-06-01 DIAGNOSIS — J189 Pneumonia, unspecified organism: Secondary | ICD-10-CM | POA: Insufficient documentation

## 2023-06-01 DIAGNOSIS — F32A Depression, unspecified: Secondary | ICD-10-CM

## 2023-06-01 DIAGNOSIS — K219 Gastro-esophageal reflux disease without esophagitis: Secondary | ICD-10-CM

## 2023-06-01 DIAGNOSIS — E538 Deficiency of other specified B group vitamins: Secondary | ICD-10-CM | POA: Diagnosis not present

## 2023-06-01 DIAGNOSIS — J302 Other seasonal allergic rhinitis: Secondary | ICD-10-CM

## 2023-06-01 DIAGNOSIS — Z87891 Personal history of nicotine dependence: Secondary | ICD-10-CM | POA: Diagnosis not present

## 2023-06-01 DIAGNOSIS — J4540 Moderate persistent asthma, uncomplicated: Secondary | ICD-10-CM

## 2023-06-01 DIAGNOSIS — M797 Fibromyalgia: Secondary | ICD-10-CM

## 2023-06-01 DIAGNOSIS — R059 Cough, unspecified: Secondary | ICD-10-CM | POA: Diagnosis not present

## 2023-06-01 LAB — CBC WITH DIFFERENTIAL/PLATELET
Basophils Absolute: 0.1 10*3/uL (ref 0.0–0.1)
Basophils Relative: 1 % (ref 0.0–3.0)
Eosinophils Absolute: 0.3 10*3/uL (ref 0.0–0.7)
Eosinophils Relative: 5.5 % — ABNORMAL HIGH (ref 0.0–5.0)
HCT: 40.8 % (ref 36.0–46.0)
Hemoglobin: 13.2 g/dL (ref 12.0–15.0)
Lymphocytes Relative: 31.1 % (ref 12.0–46.0)
Lymphs Abs: 1.7 10*3/uL (ref 0.7–4.0)
MCHC: 32.3 g/dL (ref 30.0–36.0)
MCV: 89.3 fL (ref 78.0–100.0)
Monocytes Absolute: 0.4 10*3/uL (ref 0.1–1.0)
Monocytes Relative: 7.5 % (ref 3.0–12.0)
Neutro Abs: 3 10*3/uL (ref 1.4–7.7)
Neutrophils Relative %: 54.9 % (ref 43.0–77.0)
Platelets: 324 10*3/uL (ref 150.0–400.0)
RBC: 4.56 Mil/uL (ref 3.87–5.11)
RDW: 13.5 % (ref 11.5–15.5)
WBC: 5.5 10*3/uL (ref 4.0–10.5)

## 2023-06-01 LAB — COMPREHENSIVE METABOLIC PANEL
ALT: 23 U/L (ref 0–35)
AST: 17 U/L (ref 0–37)
Albumin: 4.3 g/dL (ref 3.5–5.2)
Alkaline Phosphatase: 79 U/L (ref 39–117)
BUN: 11 mg/dL (ref 6–23)
CO2: 28 meq/L (ref 19–32)
Calcium: 9.6 mg/dL (ref 8.4–10.5)
Chloride: 102 meq/L (ref 96–112)
Creatinine, Ser: 0.71 mg/dL (ref 0.40–1.20)
GFR: 98.49 mL/min (ref >60.00)
Glucose, Bld: 98 mg/dL (ref 70–99)
Potassium: 4.5 meq/L (ref 3.5–5.1)
Sodium: 138 meq/L (ref 135–145)
Total Bilirubin: 0.4 mg/dL (ref 0.2–1.2)
Total Protein: 7.2 g/dL (ref 6.0–8.3)

## 2023-06-01 LAB — LIPID PANEL
Cholesterol: 195 mg/dL (ref 0–200)
HDL: 44.6 mg/dL (ref >39.00)
LDL Cholesterol: 110 mg/dL — ABNORMAL HIGH (ref 0–99)
NonHDL: 150.08
Total CHOL/HDL Ratio: 4
Triglycerides: 201 mg/dL — ABNORMAL HIGH (ref 0.0–149.0)
VLDL: 40.2 mg/dL — ABNORMAL HIGH (ref 0.0–40.0)

## 2023-06-01 LAB — PROTIME-INR
INR: 1 {ratio} (ref 0.8–1.0)
Prothrombin Time: 10.8 s (ref 9.6–13.1)

## 2023-06-01 LAB — VITAMIN B12: Vitamin B-12: 582 pg/mL (ref 211–911)

## 2023-06-01 LAB — APTT: aPTT: 32.4 s (ref 25.4–36.8)

## 2023-06-01 LAB — TSH: TSH: 0.91 u[IU]/mL (ref 0.35–5.50)

## 2023-06-01 NOTE — Telephone Encounter (Signed)
Electronic request sent 

## 2023-06-01 NOTE — Progress Notes (Signed)
/  Subjective:     Patient ID: Christine [name], female    DOB: March 15, 1971, 52 y.o.   MRN: [id number]  Chief Complaint  Patient presents with   Pre-op Exam    HPI  Discussed the use of AI scribe software for clinical note transcription with the patient, who gave verbal consent to proceed.  History of Present Illness    Patient presents today for complete physical and Pre-op clearance Left TKA with Dr. Jene Every. She patient was seen and treated for Community acquired pneumonia 2 weeks ago.  Overall she is feeling better, just has a lingering cough and some voice hoarseness. The patient's left knee pain has been worsening over the past year, limiting her activity and contributing to weight gain. The patient's fibromyalgia symptoms are present in the shoulders and neck, but have been secondary to the knee pain. The patient's asthma has been acting up slightly due to the recent illness, but is generally well-controlled. The patient also reports occasional migraines, occurring about once or twice a month. The patient's mood has been stable on Cymbalta, which also helps with fibromyalgia pain.      Immunizations: flu shot today, declines shingrix Diet:  diet is fair, eats too many sweets Wt Readings from Last 3 Encounters:  06/01/23 236 lb (107 kg)  05/16/23 232 lb 9.6 oz (105.5 kg)  12/28/22 239 lb (108.4 kg)  Exercise: limited due to left knee pain Colonoscopy: she has cologuard at home and plans to complete Pap Smear: due in July 2025 Mammogram: due Vision: up to date Dental: up to date     Health Maintenance Due  Topic Date Due   Fecal DNA (Cologuard)  Never done   Zoster Vaccines- Shingrix (1 of 2) Never done   MAMMOGRAM  02/27/2022   INFLUENZA VACCINE  03/03/2023   COVID-19 Vaccine (3 - 2023-24 season) 04/03/2023    Past Medical History:  Diagnosis Date   Allergy    Anemia    stated h/o pernicious anemia   Arthritis    osteoarthritis   Asthma    Acute  asthmatic bronchitis 2011 while pregnant   Carpal tunnel syndrome    both wrist   Depression    Diverticulitis    recurrent   Family history of anesthesia complication    nausea and vomiting   Fibromyalgia    GERD (gastroesophageal reflux disease)    Hypothyroidism    Migraine, unspecified, without mention of intractable migraine without mention of status migrainosus    Miscarriage 2007   PONV (postoperative nausea and vomiting)    Thyroid disease     Past Surgical History:  Procedure Laterality Date   CHOLECYSTECTOMY N/A 03/25/2014   Procedure: LAPAROSCOPIC CHOLECYSTECTOMY;  Surgeon: Axel Filler, MD;  Location: [facility] OR;  Service: General;  Laterality: N/A;   DILATION AND CURETTAGE OF UTERUS     miscarriage   LAPAROSCOPY  5/94   x 2   SIGMOIDECTOMY     Hand-assisted laparoscopic sigmoidectomy with splenic flexure takedown.    Family History  Problem Relation Age of Onset   Barrett's esophagus Mother    Coronary artery disease Mother    Congestive Heart Failure Mother    Hypertension Mother    Fibromyalgia Mother    Chronic fatigue Mother    Anemia Mother    Irritable bowel syndrome Mother    Atrial fibrillation Mother    Colon polyps Father    Diabetes Father    Hypertension Father  Heart disease Father    Graves' disease Maternal Grandmother    CAD Maternal Grandfather    Ovarian cancer Paternal Grandmother    Diabetes Paternal Grandfather    Ehlers-Danlos syndrome Daughter    Anemia Daughter    Other Daughter        Postural orthostatic hypotension   Irritable bowel syndrome Daughter    Diabetes Paternal Aunt    Crohn's disease Cousin    Alcohol abuse Other    Arthritis Other    Hyperlipidemia Other    Hypertension Other    Stroke Other    Coronary artery disease Other    Clotting disorder Other        Great Grandfather   Colon cancer Neg Hx     Social History   Socioeconomic History   Marital status: Married    Spouse name: Not on file    Number of children: 2   Years of education: Not on file   Highest education level: Some college, no degree  Occupational History   Not on file  Tobacco Use   Smoking status: Former    Current packs/day: 0.00    Average packs/day: 0.5 packs/day for 3.0 years (1.5 ttl pk-yrs)    Types: Cigarettes    Start date: 01/30/1997    Quit date: 01/31/2000    Years since quitting: 23.3   Smokeless tobacco: Never  Substance and Sexual Activity   Alcohol use: Yes    Alcohol/week: 2.0 standard drinks of alcohol    Types: 2 Glasses of wine per week    Comment: social   Drug use: No   Sexual activity: Yes    Partners: Male    Birth control/protection: I.U.D.  Other Topics Concern   Not on file  Social History Narrative   Married, 2 daughters (62 and 33) son age 70   Occupation: Housewife   Daily Caffeine Use   Patient does not get regular exercise.   Social Determinants of Health   Financial Resource Strain: Low Risk  (05/16/2023)   Overall Financial Resource Strain (CARDIA)    Difficulty of Paying Living Expenses: Not hard at all  Food Insecurity: No Food Insecurity (05/16/2023)   Hunger Vital Sign    Worried About Running Out of Food in the Last Year: Never true    Ran Out of Food in the Last Year: Never true  Transportation Needs: No Transportation Needs (05/16/2023)   PRAPARE - Administrator, Civil Service (Medical): No    Lack of Transportation (Non-Medical): No  Physical Activity: Insufficiently Active (05/16/2023)   Exercise Vital Sign    Days of Exercise per Week: 1 day    Minutes of Exercise per Session: 20 min  Stress: No Stress Concern Present (05/16/2023)   [employer] of Occupational Health - Occupational Stress Questionnaire    Feeling of Stress : Not at all  Social Connections: Socially Integrated (05/16/2023)   Social Connection and Isolation Panel [NHANES]    Frequency of Communication with Friends and Family: More than three times a week     Frequency of Social Gatherings with Friends and Family: Once a week    Attends Religious Services: More than 4 times per year    Active Member of [employer] or Organizations: Yes    Attends Banker Meetings: More than 4 times per year    Marital Status: Married  Catering manager Violence: Not At Risk (08/22/2022)   Received from [employer]  HITS    Over the last 12 months how often did your partner physically hurt you?: 1    Over the last 12 months how often did your partner insult you or talk down to you?: 1    Over the last 12 months how often did your partner threaten you with physical harm?: 1    Over the last 12 months how often did your partner scream or curse at you?: 1    Outpatient Medications Prior to Visit  Medication Sig Dispense Refill   albuterol (VENTOLIN HFA) 108 (90 Base) MCG/ACT inhaler Inhale 2 puffs into the lungs every 6 (six) hours as needed for wheezing or shortness of breath. 6.7 g 3   amLODipine (NORVASC) 5 MG tablet TAKE 1 AND 1/2 TABLETS(7.5 MG) BY MOUTH DAILY 135 tablet 1   Azelastine-Fluticasone 137-50 MCG/ACT SUSP Place 1 spray into the nose in the morning and at bedtime. 23 g 5   cyanocobalamin (VITAMIN B12) 1000 MCG/ML injection ADMINISTER 1 ML(1000 MCG) UNDER THE SKIN EVERY 14 Days 6 mL 4   DULoxetine (CYMBALTA) 60 MG capsule Take 1 capsule (60 mg total) by mouth daily. 90 capsule 1   EPINEPHrine (AUVI-Q) 0.3 mg/0.3 mL IJ SOAJ injection Inject 0.3 mg into the muscle as needed for anaphylaxis. 1 each 2   fluticasone-salmeterol (ADVAIR DISKUS) 250-50 MCG/ACT AEPB Inhale 1 puff into the lungs in the morning and at bedtime. 60 each 3   levocetirizine (XYZAL) 5 MG tablet Take 1 tablet (5 mg total) by mouth every evening. 90 tablet 1   Levonorgestrel (MIRENA, 52 MG, IU) by Intrauterine route.     levothyroxine (SYNTHROID) 125 MCG tablet Take 1 tablet (125 mcg total) by mouth daily before breakfast. 90 tablet 1   meloxicam (MOBIC) 15 MG  tablet Take 15 mg by mouth 3 (three) times daily.     montelukast (SINGULAIR) 10 MG tablet TAKE 1 TABLET(10 MG) BY MOUTH AT BEDTIME 90 tablet 1   Olopatadine HCl 0.7 % SOLN Apply 1 drop to eye daily as needed (itchy/watery eyes). 2.5 mL 5   pantoprazole (PROTONIX) 40 MG tablet Take 1 tablet (40 mg total) by mouth daily. 90 tablet 0   SYRINGE-NEEDLE, DISP, 3 ML (BD INTEGRA SYRINGE) 25G X 1" 3 ML MISC USE AS DIRECTED 12 each 0   promethazine-dextromethorphan (PROMETHAZINE-DM) 6.25-15 MG/5ML syrup Take 5 mLs by mouth 4 (four) times daily as needed for cough. 118 mL 0   No facility-administered medications prior to visit.    Allergies  Allergen Reactions   Hydrocodone Nausea Only and Other (See Comments)    Hallucination    Dilaudid [Hydromorphone Hcl]    Latex Rash   Penicillins Rash and Other (See Comments)    Childhood allergy    Review of Systems  Constitutional:  Negative for weight loss.  HENT:  Negative for hearing loss.   Eyes:  Negative for blurred vision.  Respiratory:  Positive for cough (mild following PNA 2 weeks ago).   Cardiovascular:  Positive for leg swelling (only left knee).  Gastrointestinal:  Negative for constipation and diarrhea.  Genitourinary:  Negative for dysuria and frequency.  Musculoskeletal:  Positive for joint pain (left knee). Negative for myalgias.  Skin:  Negative for rash.  Neurological:  Positive for headaches (rare migraines).  Psychiatric/Behavioral:         Denies depression/anxiety.       Objective:    Physical Exam   BP (!) 126/58 (BP Location: Right Arm, Patient Position:  Sitting, Cuff Size: Large)   Pulse 82   Temp 98.6 F (37 C) (Oral)   Resp 16   Ht 5\' 9"  (1.753 m)   Wt 236 lb (107 kg)   SpO2 98%   BMI 34.85 kg/m  Wt Readings from Last 3 Encounters:  06/01/23 236 lb (107 kg)  05/16/23 232 lb 9.6 oz (105.5 kg)  12/28/22 239 lb (108.4 kg)   Physical Exam  Constitutional: She is oriented to person, place, and time. She  appears well-developed and well-nourished. No distress.  HENT:  Head: Normocephalic and atraumatic.  Right Ear: Tympanic membrane and ear canal normal.  Left Ear: Tympanic membrane and ear canal normal.  Mouth/Throat: Oropharynx is clear and moist.  Eyes: Pupils are equal, round, and reactive to light. No scleral icterus.  Neck: Normal range of motion. No thyromegaly present.  Cardiovascular: Normal rate and regular rhythm.   No murmur heard. Pulmonary/Chest: Effort normal and breath sounds normal. No respiratory distress. He has no wheezes. She has no rales. She exhibits no tenderness.  Abdominal: Soft. Bowel sounds are normal. She exhibits no distension and no mass. There is no tenderness. There is no rebound and no guarding.  Musculoskeletal: She exhibits no edema.  Lymphadenopathy:    She has no cervical adenopathy.  Neurological: She is alert and oriented to person, place, and time. She has normal patellar reflexes. She exhibits normal muscle tone. Coordination normal.  Skin: Skin is warm and dry.  Psychiatric: She has a normal mood and affect. Her behavior is normal. Judgment and thought content normal.  Breast/pelvic: deferred          Assessment & Plan:       Assessment & Plan:   Problem List Items Addressed This Visit       Unprioritized   Shortened PR interval    EKG tracing is personally reviewed.  EKG notes NSR.  Not is made of short PR syndrome with PR interval of 118 ms.   Had normal PR on previous EKG.  I recommended that pt see cardiology for evaluation/cardiac clearance prior to her surgery.       Relevant Orders   Ambulatory referral to Cardiology   Seasonal allergies    Stable on Dymista/singulair. Continue same.       Primary hypertension    BP Readings from Last 3 Encounters:  06/01/23 (!) 126/58  05/16/23 (!) 140/79  12/28/22 137/65    Well-controlled on Amlodipine 7.5mg  daily. -Continue same.        Preventative health care - Primary      -Administer influenza vaccine today. -Schedule shingles vaccine on a day patient can rest following. -Complete at-home colon cancer screening kit. -Check thyroid function. -Check cholesterol level. -Perform EKG for preoperative evaluation. -Recommend updated COVID booster at local pharmacy.      Moderate persistent asthma    Some symptoms with recent CAP. Continues wixela and albuterol.       Hypothyroidism    Lab Results  Component Value Date   TSH 0.98 12/28/2022   Stable on synthroid, update TSH.      Relevant Orders   TSH   Hyperlipidemia    Diet controlled.  Lab Results  Component Value Date   CHOL 174 04/10/2021   HDL 40.60 04/10/2021   LDLCALC 103 (H) 04/10/2021   LDLDIRECT 126.0 07/04/2018   TRIG 149.0 04/10/2021   CHOLHDL 4 04/10/2021   Continues dietary efforts, update lipid panel.       Relevant  Orders   Lipid panel   GASTROESOPHAGEAL REFLUX DISEASE    Stable on pantoprazole, continue same.        Fibromyalgia    Overall has been fairly mild.       Depression    Reports stable mood on cymbalta.       Community acquired pneumonia of right lower lobe of lung    Completed treatment, clinically improving.  Update CXR to ensure resolution prior to surgery.      Relevant Orders   DG Chest 2 View   B12 deficiency     Self-administering B12 injections every 14 days at home. -Check B12 level.      Relevant Orders   B12   Other Visit Diagnoses     Preoperative evaluation to rule out surgical contraindication       Relevant Orders   CBC w/Diff   Comp Met (CMET)   Protime-INR   PTT   Urine Culture   EKG 12-Lead (Completed)   Needs flu shot       Relevant Orders   Flu vaccine trivalent PF, 6mos and older(Flulaval,Afluria,Fluarix,Fluzone)       I have discontinued Foye Clock C. Stimson's promethazine-dextromethorphan. I am also having her maintain her (Levonorgestrel (MIRENA, 52 MG, IU)), Olopatadine HCl, EPINEPHrine, BD Integra  Syringe, meloxicam, albuterol, montelukast, levothyroxine, fluticasone-salmeterol, DULoxetine, cyanocobalamin, Azelastine-Fluticasone, amLODipine, levocetirizine, and pantoprazole.  No orders of the defined types were placed in this encounter.

## 2023-06-01 NOTE — Assessment & Plan Note (Signed)
-  Administer influenza vaccine today. -Schedule shingles vaccine on a day patient can rest following. -Complete at-home colon cancer screening kit. -Check thyroid function. -Check cholesterol level. -Perform EKG for preoperative evaluation. -Recommend updated COVID booster at local pharmacy.

## 2023-06-01 NOTE — Patient Instructions (Signed)
VISIT SUMMARY:  During today's visit, we discussed your ongoing health issues, including knee pain, fibromyalgia, asthma, hypertension, GERD, and vitamin B12 deficiency. We also reviewed your recent viral infection and pneumonia, which have caused a lingering cough. Your mood has been stable, and we discussed your upcoming knee replacement surgery.  YOUR PLAN:  -KNEE OSTEOARTHRITIS: Knee osteoarthritis is a condition where the cartilage in your knee joint wears down over time, causing pain and stiffness. You are scheduled for knee replacement surgery with Dr. Jillyn Hidden. We will order routine preoperative labs and repeat a chest x-ray to ensure your pneumonia has resolved before surgery.  -FIBROMYALGIA: Fibromyalgia is a condition characterized by widespread musculoskeletal pain. Your pain is primarily in your shoulders and neck and is improved with Cymbalta. Continue taking Cymbalta as prescribed.  -ASTHMA: Asthma is a condition where your airways narrow and swell, making it difficult to breathe. Your asthma has been slightly worse due to your recent illness but is generally well-controlled with Advair and a rescue inhaler as needed. Continue your current asthma management.  -HYPERTENSION: Hypertension, or high blood pressure, is a condition where the force of the blood against your artery walls is too high. Your blood pressure is well-controlled with Amlodipine 7.5mg  daily. Continue taking Amlodipine as prescribed.  -GASTROESOPHAGEAL REFLUX DISEASE (GERD): GERD is a condition where stomach acid frequently flows back into the tube connecting your mouth and stomach, causing heartburn. Your GERD is well-controlled with Pantoprazole. Continue taking Pantoprazole as prescribed.  -VITAMIN B12 DEFICIENCY: Vitamin B12 deficiency occurs when your body does not have enough vitamin B12, which is essential for nerve function and the production of red blood cells. You are self-administering B12 injections every 14  days at home. We will check your B12 level to ensure it is within the normal range.  -GENERAL HEALTH MAINTENANCE: We administered the influenza vaccine today. Please schedule your shingles vaccine for a day when you can rest afterward. Complete the at-home colon cancer screening kit. We will check your thyroid function and cholesterol level, and perform an EKG for your preoperative evaluation. We also recommend getting the updated COVID booster at your local pharmacy.  -SHORT PR SYNDROME- will refer to cardiology for pre-op evaluation.   INSTRUCTIONS:  Please follow up with the preoperative labs, chest x-ray, and EKG as discussed. Schedule your shingles vaccine and complete the at-home colon cancer screening kit. Continue with your current medications and management plans. If you have any questions or concerns, please do not hesitate to contact our office.

## 2023-06-01 NOTE — Telephone Encounter (Signed)
Please call [facility] to request pap.

## 2023-06-01 NOTE — Assessment & Plan Note (Signed)
EKG tracing is personally reviewed.  EKG notes NSR.  Not is made of short PR syndrome with PR interval of 118 ms.   Had normal PR on previous EKG.  I recommended that pt see cardiology for evaluation/cardiac clearance prior to her surgery.

## 2023-06-01 NOTE — Assessment & Plan Note (Signed)
Completed treatment, clinically improving.  Update CXR to ensure resolution prior to surgery.

## 2023-06-01 NOTE — Assessment & Plan Note (Addendum)
BP Readings from Last 3 Encounters:  06/01/23 (!) 126/58  05/16/23 (!) 140/79  12/28/22 137/65    Well-controlled on Amlodipine 7.5mg  daily. -Continue same.

## 2023-06-01 NOTE — Assessment & Plan Note (Addendum)
Lab Results  Component Value Date   TSH 0.98 12/28/2022   Stable on synthroid, update TSH.

## 2023-06-01 NOTE — Assessment & Plan Note (Signed)
Overall has been fairly mild.

## 2023-06-01 NOTE — Assessment & Plan Note (Signed)
  Self-administering B12 injections every 14 days at home. -Check B12 level.

## 2023-06-01 NOTE — Assessment & Plan Note (Signed)
Diet controlled.  Lab Results  Component Value Date   CHOL 174 04/10/2021   HDL 40.60 04/10/2021   LDLCALC 103 (H) 04/10/2021   LDLDIRECT 126.0 07/04/2018   TRIG 149.0 04/10/2021   CHOLHDL 4 04/10/2021   Continues dietary efforts, update lipid panel.

## 2023-06-01 NOTE — Assessment & Plan Note (Signed)
Stable on Dymista/singulair. Continue same.

## 2023-06-01 NOTE — Assessment & Plan Note (Signed)
Reports stable mood on cymbalta.

## 2023-06-01 NOTE — Assessment & Plan Note (Signed)
Stable on pantoprazole, continue same.

## 2023-06-01 NOTE — Assessment & Plan Note (Addendum)
Some symptoms with recent CAP. Continues wixela and albuterol.

## 2023-06-02 ENCOUNTER — Telehealth: Payer: Self-pay

## 2023-06-02 LAB — URINE CULTURE
MICRO NUMBER:: 15663705
Result:: NO GROWTH
SPECIMEN QUALITY:: ADEQUATE

## 2023-06-02 NOTE — Telephone Encounter (Signed)
PA initiated via Covermymeds; KEY: [id number]. Awaiting determination.

## 2023-06-03 NOTE — Telephone Encounter (Signed)
PA approved. Effective 06/03/2023 to 06/02/2024

## 2023-06-09 ENCOUNTER — Ambulatory Visit: Payer: BC Managed Care – PPO | Attending: Cardiology | Admitting: Cardiology

## 2023-06-09 ENCOUNTER — Encounter: Payer: Self-pay | Admitting: Cardiology

## 2023-06-09 VITALS — BP 146/82 | HR 77 | Ht 68.0 in | Wt 239.6 lb

## 2023-06-09 DIAGNOSIS — Z01818 Encounter for other preprocedural examination: Secondary | ICD-10-CM

## 2023-06-09 DIAGNOSIS — R9431 Abnormal electrocardiogram [ECG] [EKG]: Secondary | ICD-10-CM

## 2023-06-09 DIAGNOSIS — I2510 Atherosclerotic heart disease of native coronary artery without angina pectoris: Secondary | ICD-10-CM

## 2023-06-09 DIAGNOSIS — R0600 Dyspnea, unspecified: Secondary | ICD-10-CM

## 2023-06-09 MED ORDER — METOPROLOL TARTRATE 100 MG PO TABS
100.0000 mg | ORAL_TABLET | Freq: Once | ORAL | 0 refills | Status: DC
Start: 2023-06-09 — End: 2023-10-18

## 2023-06-09 NOTE — Progress Notes (Addendum)
Cardiology CONSULT Note    Date:  06/09/2023   ID:  Christine [name], DOB 1971/05/18, MRN [id number]  PCP:  Sandford Craze, NP  Cardiologist:  Armanda Magic, MD   Chief Complaint  Patient presents with   New Patient (Initial Visit)    SOB, CP, abnormal EKG and preop clearance    Patient Profile: Christine [name] is a 52 y.o. female who is being seen today for the evaluation of abnormal EKG and preop cardiac clearance at the request of Sandford Craze, NP.  History of Present Illness:  Christine [name] is a 52 y.o. female who is being seen today for the evaluation of abnormal EKG at the request of Sandford Craze, NP.  [name] with a history of allergies, pernicious anemia, arthritis, asthma, depression, fibromyalgia, GERD and migraine headaches.   She was recently seen by her PCP for preoperative clearance for left total knee replacement.  An EKG was done which showed a short PR interval and she is now referred for further evaluation.  She has had some problems with chest pain and SOB in the past when she is having an asthma flare.  This resolves with her inhalers.  She denies any PND, orthopnea, LE edema, dizziness, palpitations or syncope. She is compliant with her meds and is tolerating meds with no SE.    Past Medical History:  Diagnosis Date   Allergy    Anemia    stated h/o pernicious anemia   Arthritis    osteoarthritis   Asthma    Acute asthmatic bronchitis 2011 while pregnant   Carpal tunnel syndrome    both wrist   Depression    Diverticulitis    recurrent   Family history of anesthesia complication    nausea and vomiting   Fibromyalgia    GERD (gastroesophageal reflux disease)    Hypothyroidism    Migraine, unspecified, without mention of intractable migraine without mention of status migrainosus    Miscarriage 2007   PONV (postoperative nausea and vomiting)    Thyroid disease     Past Surgical History:  Procedure  Laterality Date   CHOLECYSTECTOMY N/A 03/25/2014   Procedure: LAPAROSCOPIC CHOLECYSTECTOMY;  Surgeon: Axel Filler, MD;  Location: [facility] OR;  Service: General;  Laterality: N/A;   DILATION AND CURETTAGE OF UTERUS     miscarriage   LAPAROSCOPY  5/94   x 2   SIGMOIDECTOMY     Hand-assisted laparoscopic sigmoidectomy with splenic flexure takedown.    Current Medications: Current Meds  Medication Sig   albuterol (VENTOLIN HFA) 108 (90 Base) MCG/ACT inhaler Inhale 2 puffs into the lungs every 6 (six) hours as needed for wheezing or shortness of breath.   amLODipine (NORVASC) 5 MG tablet TAKE 1 AND 1/2 TABLETS(7.5 MG) BY MOUTH DAILY   Azelastine-Fluticasone 137-50 MCG/ACT SUSP Place 1 spray into the nose in the morning and at bedtime.   cyanocobalamin (VITAMIN B12) 1000 MCG/ML injection ADMINISTER 1 ML(1000 MCG) UNDER THE SKIN EVERY 14 Days   DULoxetine (CYMBALTA) 60 MG capsule Take 1 capsule (60 mg total) by mouth daily.   EPINEPHrine (AUVI-Q) 0.3 mg/0.3 mL IJ SOAJ injection Inject 0.3 mg into the muscle as needed for anaphylaxis.   fluticasone-salmeterol (ADVAIR DISKUS) 250-50 MCG/ACT AEPB Inhale 1 puff into the lungs in the morning and at bedtime.   levocetirizine (XYZAL) 5 MG tablet Take 1 tablet (5 mg total) by mouth every evening.   Levonorgestrel (MIRENA, 52 MG, IU) by  Intrauterine route.   levothyroxine (SYNTHROID) 125 MCG tablet Take 1 tablet (125 mcg total) by mouth daily before breakfast.   meloxicam (MOBIC) 15 MG tablet Take 15 mg by mouth 3 (three) times daily.   montelukast (SINGULAIR) 10 MG tablet TAKE 1 TABLET(10 MG) BY MOUTH AT BEDTIME   pantoprazole (PROTONIX) 40 MG tablet Take 1 tablet (40 mg total) by mouth daily.   SYRINGE-NEEDLE, DISP, 3 ML (BD INTEGRA SYRINGE) 25G X 1" 3 ML MISC USE AS DIRECTED    Allergies:   Hydrocodone, Dilaudid [hydromorphone hcl], Latex, and Penicillins   Social History   Socioeconomic History   Marital status: Married    Spouse name: Not on  file   Number of children: 2   Years of education: Not on file   Highest education level: Some college, no degree  Occupational History   Not on file  Tobacco Use   Smoking status: Former    Current packs/day: 0.00    Average packs/day: 0.5 packs/day for 3.0 years (1.5 ttl pk-yrs)    Types: Cigarettes    Start date: 01/30/1997    Quit date: 01/31/2000    Years since quitting: 23.3   Smokeless tobacco: Never  Substance and Sexual Activity   Alcohol use: Yes    Alcohol/week: 2.0 standard drinks of alcohol    Types: 2 Glasses of wine per week    Comment: social   Drug use: No   Sexual activity: Yes    Partners: Male    Birth control/protection: I.U.D.  Other Topics Concern   Not on file  Social History Narrative   Married, 2 daughters (25 and 31) son age 63   Occupation: Housewife   Daily Caffeine Use   Patient does not get regular exercise.   Social Determinants of Health   Financial Resource Strain: Low Risk  (05/16/2023)   Overall Financial Resource Strain (CARDIA)    Difficulty of Paying Living Expenses: Not hard at all  Food Insecurity: No Food Insecurity (05/16/2023)   Hunger Vital Sign    Worried About Running Out of Food in the Last Year: Never true    Ran Out of Food in the Last Year: Never true  Transportation Needs: No Transportation Needs (05/16/2023)   PRAPARE - Administrator, Civil Service (Medical): No    Lack of Transportation (Non-Medical): No  Physical Activity: Insufficiently Active (05/16/2023)   Exercise Vital Sign    Days of Exercise per Week: 1 day    Minutes of Exercise per Session: 20 min  Stress: No Stress Concern Present (05/16/2023)   [employer] of Occupational Health - Occupational Stress Questionnaire    Feeling of Stress : Not at all  Social Connections: Socially Integrated (05/16/2023)   Social Connection and Isolation Panel [NHANES]    Frequency of Communication with Friends and Family: More than three times a week     Frequency of Social Gatherings with Friends and Family: Once a week    Attends Religious Services: More than 4 times per year    Active Member of [employer] or Organizations: Yes    Attends Engineer, structural: More than 4 times per year    Marital Status: Married     Family History:  The patient's family history includes Alcohol abuse in an other family member; Anemia in her daughter and mother; Arthritis in an other family member; Atrial fibrillation in her mother; Barrett's esophagus in her mother; CAD in her maternal grandfather; Chronic fatigue in  her mother; Clotting disorder in an other family member; Colon polyps in her father; Congestive Heart Failure in her mother; Coronary artery disease in her mother and another family member; Crohn's disease in her cousin; Diabetes in her father, paternal aunt, and paternal grandfather; Ehlers-Danlos syndrome in her daughter; Fibromyalgia in her mother; Luiz Blare' disease in her maternal grandmother; Heart disease in her father; Hyperlipidemia in an other family member; Hypertension in her father, mother, and another family member; Irritable bowel syndrome in her daughter and mother; Other in her daughter; Ovarian cancer in her paternal grandmother; Stroke in an other family member.   ROS:   Please see the history of present illness.    ROS All other systems reviewed and are negative.      No data to display             PHYSICAL EXAM:   VS:  BP (!) 146/82   Pulse 77   Ht 5\' 8"  (1.727 m)   Wt 239 lb 9.6 oz (108.7 kg)   SpO2 98%   BMI 36.43 kg/m    GEN: Well nourished, well developed, in no acute distress  HEENT: normal  Neck: no JVD, carotid bruits, or masses Cardiac: RRR; no murmurs, rubs, or gallops,no edema.  Intact distal pulses bilaterally.  Respiratory:  clear to auscultation bilaterally, normal work of breathing GI: soft, nontender, nondistended, + BS MS: no deformity or atrophy  Skin: warm and dry, no rash Neuro:  Alert  and Oriented x 3, Strength and sensation are intact Psych: euthymic mood, full affect  Wt Readings from Last 3 Encounters:  06/09/23 239 lb 9.6 oz (108.7 kg)  06/01/23 236 lb (107 kg)  05/16/23 232 lb 9.6 oz (105.5 kg)      Studies/Labs Reviewed:    EKG Interpretation Date/Time:  Thursday June 09 2023 13:23:13 EST Ventricular Rate:  77 PR Interval:  148 QRS Duration:  82 QT Interval:  354 QTC Calculation: 400 R Axis:   79  Text Interpretation: Normal sinus rhythm Septal infarct , age undetermined When compared with ECG of 20-Dec-2016 16:48, No significant change was found Confirmed by Armanda Magic [phone]) on 06/09/2023 1:39:23 PM    Recent Labs: 06/01/2023: ALT 23; BUN 11; Creatinine, Ser 0.71; Hemoglobin 13.2; Platelets 324.0; Potassium 4.5; Sodium 138; TSH 0.91   Lipid Panel    Component Value Date/Time   CHOL 195 06/01/2023 1020   TRIG 201.0 (H) 06/01/2023 1020   HDL 44.60 06/01/2023 1020   CHOLHDL 4 06/01/2023 1020   VLDL 40.2 (H) 06/01/2023 1020   LDLCALC 110 (H) 06/01/2023 1020   LDLCALC 107 (H) 04/08/2020 0907   LDLDIRECT 126.0 07/04/2018 1049     ASSESSMENT:    1. Nonspecific abnormal electrocardiogram (ECG) (EKG)   2. Coronary artery calcification seen on CAT scan   3. Preoperative clearance      PLAN:  In order of problems listed above:  Abnormal EKG -EKG done by PCP showed a PR interval of 118 ms with normal ranging from 120 to 200 ms.  This was a computer indurated measurement.  I recalculated this and it is 120 ms. -In review of prior EKGs PR interval was 116 ms in 2013 and 130 ms in 2018. -Patient is completely asymptomatic with no history of palpitations, dizziness or syncope. -No further workup needed at this time  Coronary artery calcifications -Noted on chest CT 2020 with calcifications in the LAD -her mother's side of the family has a lot of CAD -she  has a remote hx of transient smoking -she has been having episodic CP and SOB likely  related to her asthma but given underlying coronary Ca there is concern for CAD -Recommend coronary CTA to define coronary anatomy prior to surgery  Preoperative clearance Her perioperative risk of a major cardiac event is 0.4% according to the Revised Cardiac Risk Index (RCRI).  Therefore, the patient is at low risk for perioperative complications.   The patient's  functional capacity is good at 5 METs according to the Duke Activity Status Index (DASI). Recommendations: She has been having a lot of SOB that has limited her ability to be active and likely sedentary state is the likely etiology of her SOB. She has also gained 20lbs in the past year.   -will get coronary CTA and 2D echo for SOB prior to clearance  Time Spent: 20 minutes total time of encounter, including 15 minutes spent in face-to-face patient care on the date of this encounter. This time includes coordination of care and counseling regarding above mentioned problem list. Remainder of non-face-to-face time involved reviewing chart documents/testing relevant to the patient encounter and documentation in the medical record. I have independently reviewed documentation from referring provider  Followup:  PRN  Medication Adjustments/Labs and Tests Ordered: Current medicines are reviewed at length with the patient today.  Concerns regarding medicines are outlined above.  Medication changes, Labs and Tests ordered today are listed in the Patient Instructions below.  There are no Patient Instructions on file for this visit.   Signed, Armanda Magic, MD  06/09/2023 1:38 PM    [facility] [address] [place], [place], [place]  [address] Phone: [phone]; Fax: [phone]

## 2023-06-09 NOTE — Patient Instructions (Signed)
Medication Instructions:  Your physician recommends that you continue on your current medications as directed. Please refer to the Current Medication list given to you today.  *If you need a refill on your cardiac medications before your next appointment, please call your pharmacy*   Lab Work: None.  If you have labs (blood work) drawn today and your tests are completely normal, you will receive your results only by: MyChart Message (if you have MyChart) OR A paper copy in the mail If you have any lab test that is abnormal or we need to change your treatment, we will call you to review the results.   Testing/Procedures: Your physician has requested that you have an echocardiogram. Echocardiography is a painless test that uses sound waves to create images of your heart. It provides your doctor with information about the size and shape of your heart and how well your heart's chambers and valves are working. This procedure takes approximately one hour. There are no restrictions for this procedure. Please do NOT wear cologne, perfume, aftershave, or lotions (deodorant is allowed). Please arrive 15 minutes prior to your appointment time.  Please note: We ask at that you not bring children with you during ultrasound (echo/ vascular) testing. Due to room size and safety concerns, children are not allowed in the ultrasound rooms during exams. Our front office staff cannot provide observation of children in our lobby area while testing is being conducted. An adult accompanying a patient to their appointment will only be allowed in the ultrasound room at the discretion of the ultrasound technician under special circumstances. We apologize for any inconvenience.    Your cardiac CT will be scheduled at:   [facility] [address] [place], [place] [address] [phone]  Please arrive at the [facility] and Children's Entrance (Entrance C2) of [facility] 30 minutes prior to  test start time. You can use the FREE valet parking offered at entrance C (encouraged to control the heart rate for the test)  Proceed to the [facility] Radiology Department (first floor) to check-in and test prep.  All radiology patients and guests should use entrance C2 at [facility], accessed from [facility], even though the hospital's physical address listed is [address].       Please follow these instructions carefully (unless otherwise directed):  An IV will be required for this test and Nitroglycerin will be given.  Hold all erectile dysfunction medications at least 3 days (72 hrs) prior to test. (Ie viagra, cialis, sildenafil, tadalafil, etc)   On the Night Before the Test: Be sure to Drink plenty of water. Do not consume any caffeinated/decaffeinated beverages or chocolate 12 hours prior to your test. Do not take any antihistamines 12 hours prior to your test.  On the Day of the Test: Drink plenty of water until 1 hour prior to the test. Do not eat any food 1 hour prior to test. You may take your regular medications prior to the test.  Take metoprolol (Lopressor) two hours prior to test. If you take Furosemide/Hydrochlorothiazide/Spironolactone, please HOLD on the morning of the test. FEMALES- please wear underwire-free bra if available, avoid dresses & tight clothing         After the Test: Drink plenty of water. After receiving IV contrast, you may experience a mild flushed feeling. This is normal. On occasion, you may experience a mild rash up to 24 hours after the test. This is not dangerous. If this  occurs, you can take Benadryl 25 mg and increase your fluid intake. If you experience trouble breathing, this can be serious. If it is severe call 911 IMMEDIATELY. If it is mild, please call our office. If you take any of these medications: Glipizide/Metformin, Avandament, Glucavance, please do not take 48 hours after completing test  unless otherwise instructed.  We will call to schedule your test 2-4 weeks out understanding that some insurance companies will need an authorization prior to the service being performed.   For more information and frequently asked questions, please visit our website : [email or web address]  For non-scheduling related questions, please contact the cardiac imaging nurse navigator should you have any questions/concerns: Cardiac Imaging Nurse Navigators Direct Office Dial: [phone]   For scheduling needs, including cancellations and rescheduling, please call [place], [phone].    Follow-Up: At [facility], you and your health needs are our priority.  As part of our continuing mission to provide you with exceptional heart care, we have created designated Provider Care Teams.  These Care Teams include your primary Cardiologist (physician) and Advanced Practice Providers (APPs -  Physician Assistants and Nurse Practitioners) who all work together to provide you with the care you need, when you need it.  We recommend signing up for the patient portal called "MyChart".  Sign up information is provided on this After Visit Summary.  MyChart is used to connect with patients for Virtual Visits (Telemedicine).  Patients are able to view lab/test results, encounter notes, upcoming appointments, etc.  Non-urgent messages can be sent to your provider as well.   To learn more about what you can do with MyChart, go to [email or web address].    Your next appointment will be dependent on your results and it will be with:     Provider:   Dr. Armanda Magic, MD

## 2023-06-09 NOTE — Addendum Note (Signed)
Addended by: Luellen Pucker on: 06/09/2023 01:44 PM   Modules accepted: Orders

## 2023-06-10 ENCOUNTER — Ambulatory Visit (HOSPITAL_COMMUNITY)
Admission: RE | Admit: 2023-06-10 | Discharge: 2023-06-10 | Disposition: A | Discharge status: Home / Self Care | Payer: BC Managed Care – PPO | Source: Ambulatory Visit | Attending: Cardiology | Admitting: Cardiology

## 2023-06-10 DIAGNOSIS — R0600 Dyspnea, unspecified: Secondary | ICD-10-CM | POA: Diagnosis not present

## 2023-06-10 DIAGNOSIS — Z01818 Encounter for other preprocedural examination: Secondary | ICD-10-CM

## 2023-06-10 DIAGNOSIS — I2510 Atherosclerotic heart disease of native coronary artery without angina pectoris: Secondary | ICD-10-CM

## 2023-06-10 MED ORDER — NITROGLYCERIN 0.4 MG SL SUBL
SUBLINGUAL_TABLET | SUBLINGUAL | Status: AC
  Filled 2023-06-10: qty 2

## 2023-06-10 MED ORDER — IOHEXOL 350 MG/ML SOLN
95.0000 mL | Freq: Once | INTRAVENOUS | Status: AC | PRN
  Administered 2023-06-10: 95 mL via INTRAVENOUS

## 2023-06-10 MED ORDER — NITROGLYCERIN 0.4 MG SL SUBL
0.8000 mg | SUBLINGUAL_TABLET | Freq: Once | SUBLINGUAL | Status: AC
  Administered 2023-06-10: 0.8 mg via SUBLINGUAL

## 2023-06-10 NOTE — Addendum Note (Signed)
Addended by: Sandford Craze on: 06/10/2023 11:19 AM   Modules accepted: Level of Service

## 2023-06-14 ENCOUNTER — Encounter: Payer: Self-pay | Admitting: Cardiology

## 2023-06-14 DIAGNOSIS — I2510 Atherosclerotic heart disease of native coronary artery without angina pectoris: Secondary | ICD-10-CM | POA: Insufficient documentation

## 2023-06-15 ENCOUNTER — Encounter: Payer: Self-pay | Admitting: Cardiology

## 2023-06-15 ENCOUNTER — Telehealth: Payer: Self-pay

## 2023-06-15 ENCOUNTER — Ambulatory Visit (HOSPITAL_COMMUNITY): Payer: BC Managed Care – PPO | Attending: Cardiology

## 2023-06-15 DIAGNOSIS — I351 Nonrheumatic aortic (valve) insufficiency: Secondary | ICD-10-CM

## 2023-06-15 DIAGNOSIS — I071 Rheumatic tricuspid insufficiency: Secondary | ICD-10-CM

## 2023-06-15 DIAGNOSIS — R0600 Dyspnea, unspecified: Secondary | ICD-10-CM

## 2023-06-15 DIAGNOSIS — Z01818 Encounter for other preprocedural examination: Secondary | ICD-10-CM | POA: Diagnosis not present

## 2023-06-15 DIAGNOSIS — Z79899 Other long term (current) drug therapy: Secondary | ICD-10-CM

## 2023-06-15 DIAGNOSIS — E785 Hyperlipidemia, unspecified: Secondary | ICD-10-CM

## 2023-06-15 DIAGNOSIS — Z01810 Encounter for preprocedural cardiovascular examination: Secondary | ICD-10-CM

## 2023-06-15 LAB — ECHOCARDIOGRAM COMPLETE
Area-P 1/2: 3.47 cm2
P 1/2 time: 389 ms
S' Lateral: 2.8 cm

## 2023-06-15 MED ORDER — PERFLUTREN LIPID MICROSPHERE
1.0000 mL | INTRAVENOUS | Status: AC | PRN
Start: 2023-06-15 — End: 2023-06-15
  Administered 2023-06-15: 2 mL via INTRAVENOUS

## 2023-06-15 MED ORDER — ATORVASTATIN CALCIUM 20 MG PO TABS
20.0000 mg | ORAL_TABLET | Freq: Every day | ORAL | 3 refills | Status: DC
Start: 2023-06-15 — End: 2024-02-27

## 2023-06-15 MED ORDER — ASPIRIN 81 MG PO TBEC: 81.0000 mg | DELAYED_RELEASE_TABLET | Freq: Every day | ORAL | 3 refills | Status: DC

## 2023-06-15 NOTE — Telephone Encounter (Signed)
Patient requesting update on whether she can be cleared for knee surgery.

## 2023-06-15 NOTE — Telephone Encounter (Signed)
-----   Message from Armanda Magic sent at 06/14/2023  5:33 PM EST ----- Coronary CTA demonstrated mild coronary disease with 25 to 49% plaque in the distal LAD otherwise minimal less than 25% stenosis in the RCA, left main.  Start ASA 81 mg daily.  Recent LDL elevated at 110 and goal less than 70.  Start atorvastatin 20 mg daily and repeat FLP and ALT in 6 weeks

## 2023-06-15 NOTE — Telephone Encounter (Signed)
Call to patient to discuss echo and CTA results.   Patient verbalizes understanding that coronary CTA demonstrated mild coronary disease with 25 to 49% plaque in the distal LAD otherwise minimal less than 25% stenosis in the RCA, left main.Patient agrees to start ASA 81 mg daily, atorvastatin 20 mg daily and repeat FLP and ALT in 6 weeks. Orders placed.   Patient verbalizes understanding that echo showed normal pumping function of the heart muscle Mild to moderate leakiness of the aortic and tricuspid valves - patient agrees to repeat echo in 1 year for AI and TR, orders placed.

## 2023-06-17 ENCOUNTER — Telehealth: Payer: Self-pay

## 2023-06-17 NOTE — Telephone Encounter (Signed)
Called patient to advise her to call her surgeon and request that they contact our pre-op department at [phone] option 7. Explained they need to submit info about the surgery and the name of the surgeon. Patient verbalized understanding.

## 2023-06-17 NOTE — Telephone Encounter (Signed)
   Patient Name: Christine [name]  DOB: 1971/03/21 MRN: [id number]  Primary Cardiologist: None  Chart reviewed as part of pre-operative protocol coverage. Given past medical history and time since last visit, based on ACC/AHA guidelines, Christine [name] is at acceptable risk for the planned procedure without further cardiovascular testing.   Per Dr. Mayford Knife, "  Her CAD is mild and so low risk for surgery."   Please call with questions.  Joylene Grapes, NP 06/17/2023, 7:30 AM

## 2023-06-17 NOTE — Telephone Encounter (Signed)
   Pre-operative Risk Assessment    Patient Name: Christine [name]  DOB: 12/25/1970 MRN: [id number]     Request for Surgical Clearance    Procedure:  Left total knee arthroplasty   Date of Surgery:  Clearance TBD                                 Surgeon:  Dr. Antionette Poles  Surgeon's Group or Practice Name:  [facility]  Phone number:  [phone] Fax number:  [phone]   Type of Clearance Requested:   - Medical    Type of Anesthesia:  Spinal   Additional requests/questions:    SignedVernard Gambles   06/17/2023, 5:21 PM

## 2023-06-20 NOTE — Telephone Encounter (Signed)
   Patient Name: Christine [name]  DOB: 02-13-1971 MRN: [id number]  Primary Cardiologist: None  Chart reviewed as part of pre-operative protocol coverage. Given past medical history and time since last visit, based on ACC/AHA guidelines, TARIANA BONFIGLIO is at acceptable risk for the planned procedure without further cardiovascular testing.   The patient was advised that if she develops new symptoms prior to surgery to contact our office to arrange for a follow-up visit, and she verbalized understanding.  I will route this recommendation to the requesting party via Epic fax function and remove from pre-op pool.  Please call with questions.  Napoleon Form, Leodis Rains, NP 06/20/2023, 8:31 AM

## 2023-06-24 ENCOUNTER — Encounter: Payer: Self-pay | Admitting: Family

## 2023-06-24 DIAGNOSIS — J4540 Moderate persistent asthma, uncomplicated: Secondary | ICD-10-CM

## 2023-06-24 MED ORDER — MONTELUKAST SODIUM 10 MG PO TABS
ORAL_TABLET | ORAL | 1 refills | Status: DC
Start: 2023-06-24 — End: 2023-12-02

## 2023-06-27 ENCOUNTER — Other Ambulatory Visit: Payer: Self-pay

## 2023-06-27 DIAGNOSIS — E039 Hypothyroidism, unspecified: Secondary | ICD-10-CM

## 2023-06-27 DIAGNOSIS — I10 Essential (primary) hypertension: Secondary | ICD-10-CM

## 2023-06-27 DIAGNOSIS — J302 Other seasonal allergic rhinitis: Secondary | ICD-10-CM

## 2023-06-27 DIAGNOSIS — F32A Depression, unspecified: Secondary | ICD-10-CM

## 2023-06-27 MED ORDER — LEVOTHYROXINE SODIUM 125 MCG PO TABS
125.0000 ug | ORAL_TABLET | Freq: Every day | ORAL | 1 refills | Status: DC
Start: 2023-06-27 — End: 2023-12-29

## 2023-06-27 MED ORDER — AMLODIPINE BESYLATE 5 MG PO TABS
ORAL_TABLET | ORAL | 1 refills | Status: DC
Start: 2023-06-27 — End: 2023-12-30

## 2023-06-27 MED ORDER — AZELASTINE-FLUTICASONE 137-50 MCG/ACT NA SUSP
1.0000 | Freq: Two times a day (BID) | NASAL | 5 refills | Status: DC
Start: 2023-06-27 — End: 2024-02-07

## 2023-06-27 MED ORDER — DULOXETINE HCL 60 MG PO CPEP
60.0000 mg | ORAL_CAPSULE | Freq: Every day | ORAL | 1 refills | Status: DC
Start: 2023-06-27 — End: 2023-12-29

## 2023-07-04 ENCOUNTER — Other Ambulatory Visit: Payer: Self-pay

## 2023-07-04 ENCOUNTER — Encounter: Payer: BC Managed Care – PPO | Admitting: Family

## 2023-07-04 DIAGNOSIS — K219 Gastro-esophageal reflux disease without esophagitis: Secondary | ICD-10-CM

## 2023-07-04 MED ORDER — PANTOPRAZOLE SODIUM 40 MG PO TBEC
40.0000 mg | DELAYED_RELEASE_TABLET | Freq: Every day | ORAL | 0 refills | Status: DC
Start: 2023-07-04 — End: 2023-10-02

## 2023-07-04 NOTE — Telephone Encounter (Signed)
Rx sent 

## 2023-07-05 ENCOUNTER — Telehealth: Payer: Self-pay

## 2023-07-05 NOTE — Telephone Encounter (Signed)
Call to patient to advise that non-cardiac portion of coronary CTA was normal. No answer, no DPR on file, left message with no identifiers asking recipient to call [place] at our office #.

## 2023-07-05 NOTE — Telephone Encounter (Signed)
-----   Message from Armanda Magic sent at 06/29/2023  9:15 PM EST ----- Non cardiac portion of coronary CTA was normal

## 2023-07-08 ENCOUNTER — Other Ambulatory Visit: Payer: Self-pay

## 2023-07-08 DIAGNOSIS — J302 Other seasonal allergic rhinitis: Secondary | ICD-10-CM

## 2023-07-08 MED ORDER — LEVOCETIRIZINE DIHYDROCHLORIDE 5 MG PO TABS
5.0000 mg | ORAL_TABLET | Freq: Every evening | ORAL | 1 refills | Status: DC
Start: 2023-07-08 — End: 2024-02-07

## 2023-08-05 ENCOUNTER — Telehealth: Payer: Self-pay

## 2023-08-05 NOTE — Telephone Encounter (Signed)
-----   Message from Armanda Magic sent at 06/29/2023  9:15 PM EST ----- Non cardiac portion of coronary CTA was normal

## 2023-08-05 NOTE — Telephone Encounter (Signed)
 Call to patient to discuss non-cardiac CT scan, no answer. Left message with no identifiers asking recipient to call [place] at our office #. Third attempt. Letter sent.

## 2023-09-16 ENCOUNTER — Ambulatory Visit: Payer: Self-pay | Admitting: Orthopedic Surgery

## 2023-10-01 ENCOUNTER — Other Ambulatory Visit: Payer: Self-pay | Admitting: Family

## 2023-10-01 DIAGNOSIS — K219 Gastro-esophageal reflux disease without esophagitis: Secondary | ICD-10-CM

## 2023-10-03 ENCOUNTER — Other Ambulatory Visit: Payer: Self-pay | Admitting: Family

## 2023-10-17 NOTE — Progress Notes (Signed)
 Anesthesia Review:  PCP: Melissa o'Sullivan,NP LOV 05/12/23  Cardiologist : Armanda Magic- LOV 06/09/23   PPM/ ICD: Device Orders: Rep Notified:  Chest x-ray : 06/01/23- 2 view  EKG : 06/09/23  Echo : 06/15/23  CT Cors- 06/29/23  Stress test: Cardiac Cath :   Activity level:  Sleep Study/ CPAP : Fasting Blood Sugar :      / Checks Blood Sugar -- times a day:    Blood Thinner/ Instructions /Last Dose: ASA / Instructions/ Last Dose :

## 2023-10-17 NOTE — Patient Instructions (Addendum)
 SURGICAL WAITING ROOM VISITATION  Patients having surgery or a procedure may have no more than 2 support people in the waiting area - these visitors may rotate.    Children under the age of 61 must have an adult with them who is not the patient.  Due to an increase in RSV and influenza rates and associated hospitalizations, children ages 13 and under may not visit patients in [facility] hospitals.  Visitors with respiratory illnesses are discouraged from visiting and should remain at home.  If the patient needs to stay at the hospital during part of their recovery, the visitor guidelines for inpatient rooms apply. Pre-op nurse will coordinate an appropriate time for 1 support person to accompany patient in pre-op.  This support person may not rotate.    Please refer to the [facility] website for the visitor guidelines for Inpatients (after your surgery is over and you are in a regular room).       Your procedure is scheduled on:  10/28/23    Report to [facility] Main Entrance    Report to admitting at   1000AM   Call this number if you have problems the morning of surgery [phone]   Do not eat food :After Midnight.   After Midnight you may have the following liquids until ___ 0930___ AM DAY OF SURGERY  Water Non-Citrus Juices (without pulp, NO RED-Apple, White grape, White cranberry) Black Coffee (NO MILK/CREAM OR CREAMERS, sugar ok)  Clear Tea (NO MILK/CREAM OR CREAMERS, sugar ok) regular and decaf                             Plain Jell-O (NO RED)                                           Fruit ices (not with fruit pulp, NO RED)                                     Popsicles (NO RED)                                                               Sports drinks like Gatorade (NO RED)                    The day of surgery:  Drink ONE (1) Pre-Surgery Clear Ensure or G2 at  0930AM ( have completed by )  the morning of surgery. Drink in one sitting. Do not sip.   This drink was given to you during your hospital  pre-op appointment visit. Nothing else to drink after completing the  Pre-Surgery Clear Ensure or G2.          If you have questions, please contact your surgeon's office.        Oral Hygiene is also important to reduce your risk of infection.  Remember - BRUSH YOUR TEETH THE MORNING OF SURGERY WITH YOUR REGULAR TOOTHPASTE  DENTURES WILL BE REMOVED PRIOR TO SURGERY PLEASE DO NOT APPLY "Poly grip" OR ADHESIVES!!!   Do NOT smoke after Midnight   Stop all vitamins and herbal supplements 7 days before surgery.   Take these medicines the morning of surgery with A SIP OF WATER:    Inhalers as usual and bring, amlodipine, cymbalta, protonix, synthroid,   DO NOT TAKE ANY ORAL DIABETIC MEDICATIONS DAY OF YOUR SURGERY  Bring CPAP mask and tubing day of surgery.                              You may not have any metal on your body including hair pins, jewelry, and body piercing             Do not wear make-up, lotions, powders, perfumes/cologne, or deodorant  Do not wear nail polish including gel and S&S, artificial/acrylic nails, or any other type of covering on natural nails including finger and toenails. If you have artificial nails, gel coating, etc. that needs to be removed by a nail salon please have this removed prior to surgery or surgery may need to be canceled/ delayed if the surgeon/ anesthesia feels like they are unable to be safely monitored.   Do not shave  48 hours prior to surgery.               Men may shave face and neck.   Do not bring valuables to the hospital. [facility] IS NOT             RESPONSIBLE   FOR VALUABLES.   Contacts, glasses, dentures or bridgework may not be worn into surgery.   Bring small overnight bag day of surgery.   DO NOT BRING YOUR HOME MEDICATIONS TO THE HOSPITAL. PHARMACY WILL DISPENSE MEDICATIONS LISTED ON YOUR MEDICATION LIST TO YOU DURING YOUR  ADMISSION IN THE HOSPITAL!    Patients discharged on the day of surgery will not be allowed to drive home.  Someone NEEDS to stay with you for the first 24 hours after anesthesia.   Special Instructions: Bring a copy of your healthcare power of attorney and living will documents the day of surgery if you haven't scanned them before.              Please read over the following fact sheets you were given: IF YOU HAVE QUESTIONS ABOUT YOUR PRE-OP INSTRUCTIONS PLEASE CALL [phone]   If you received a COVID test during your pre-op visit  it is requested that you wear a mask when out in public, stay away from anyone that may not be feeling well and notify your surgeon if you develop symptoms. If you test positive for Covid or have been in contact with anyone that has tested positive in the last 10 days please notify you surgeon.    .  Pre-operative 5 CHG Bath Instructions   You can play a key role in reducing the risk of infection after surgery. Your skin needs to be as free of germs as possible. You can reduce the number of germs on your skin by washing with CHG (chlorhexidine gluconate) soap before surgery. CHG is an antiseptic soap that kills germs and continues to kill germs even after washing.   DO NOT use if you have an allergy to chlorhexidine/CHG or antibacterial soaps. If your skin becomes reddened or irritated, stop  using the CHG and notify one of our RNs at [phone].   Please shower with the CHG soap starting 4 days before surgery using the following schedule:     Please keep in mind the following:  DO NOT shave, including legs and underarms, starting the day of your first shower.   You may shave your face at any point before/day of surgery.  Place clean sheets on your bed the day you start using CHG soap. Use a clean washcloth (not used since being washed) for each shower. DO NOT sleep with pets once you start using the CHG.   CHG Shower Instructions:  If you choose to  wash your hair and private area, wash first with your normal shampoo/soap.  After you use shampoo/soap, rinse your hair and body thoroughly to remove shampoo/soap residue.  Turn the water OFF and apply about 3 tablespoons (45 ml) of CHG soap to a CLEAN washcloth.  Apply CHG soap ONLY FROM YOUR NECK DOWN TO YOUR TOES (washing for 3-5 minutes)  DO NOT use CHG soap on face, private areas, open wounds, or sores.  Pay special attention to the area where your surgery is being performed.  If you are having back surgery, having someone wash your back for you may be helpful. Wait 2 minutes after CHG soap is applied, then you may rinse off the CHG soap.  Pat dry with a clean towel  Put on clean clothes/pajamas   If you choose to wear lotion, please use ONLY the CHG-compatible lotions on the back of this paper.     Additional instructions for the day of surgery: DO NOT APPLY any lotions, deodorants, cologne, or perfumes.   Put on clean/comfortable clothes.  Brush your teeth.  Ask your nurse before applying any prescription medications to the skin.      CHG Compatible Lotions   Aveeno Moisturizing lotion  Cetaphil Moisturizing Cream  Cetaphil Moisturizing Lotion  Clairol Herbal Essence Moisturizing Lotion, Dry Skin  Clairol Herbal Essence Moisturizing Lotion, Extra Dry Skin  Clairol Herbal Essence Moisturizing Lotion, Normal Skin  Curel Age Defying Therapeutic Moisturizing Lotion with Alpha Hydroxy  Curel Extreme Care Body Lotion  Curel Soothing Hands Moisturizing Hand Lotion  Curel Therapeutic Moisturizing Cream, Fragrance-Free  Curel Therapeutic Moisturizing Lotion, Fragrance-Free  Curel Therapeutic Moisturizing Lotion, Original Formula  Eucerin Daily Replenishing Lotion  Eucerin Dry Skin Therapy Plus Alpha Hydroxy Crme  Eucerin Dry Skin Therapy Plus Alpha Hydroxy Lotion  Eucerin Original Crme  Eucerin Original Lotion  Eucerin Plus Crme Eucerin Plus Lotion  Eucerin TriLipid  Replenishing Lotion  Keri Anti-Bacterial Hand Lotion  Keri Deep Conditioning Original Lotion Dry Skin Formula Softly Scented  Keri Deep Conditioning Original Lotion, Fragrance Free Sensitive Skin Formula  Keri Lotion Fast Absorbing Fragrance Free Sensitive Skin Formula  Keri Lotion Fast Absorbing Softly Scented Dry Skin Formula  Keri Original Lotion  Keri Skin Renewal Lotion Keri Silky Smooth Lotion  Keri Silky Smooth Sensitive Skin Lotion  Nivea Body Creamy Conditioning Oil  Nivea Body Extra Enriched Teacher, adult education Moisturizing Lotion Nivea Crme  Nivea Skin Firming Lotion  NutraDerm 30 Skin Lotion  NutraDerm Skin Lotion  NutraDerm Therapeutic Skin Cream  NutraDerm Therapeutic Skin Lotion  ProShield Protective Hand Cream  Provon moisturizing lotion

## 2023-10-19 ENCOUNTER — Encounter (HOSPITAL_COMMUNITY): Payer: Self-pay

## 2023-10-19 ENCOUNTER — Encounter (HOSPITAL_COMMUNITY)
Admission: RE | Admit: 2023-10-19 | Discharge: 2023-10-19 | Disposition: A | Discharge status: Home / Self Care | Payer: BC Managed Care – PPO | Source: Ambulatory Visit | Attending: Specialist | Admitting: Specialist

## 2023-10-19 ENCOUNTER — Other Ambulatory Visit: Payer: Self-pay

## 2023-10-19 VITALS — BP 144/93 | HR 80 | Temp 98.1°F | Resp 16 | Ht 68.0 in | Wt 228.0 lb

## 2023-10-19 DIAGNOSIS — Z01818 Encounter for other preprocedural examination: Secondary | ICD-10-CM | POA: Diagnosis not present

## 2023-10-19 DIAGNOSIS — E039 Hypothyroidism, unspecified: Secondary | ICD-10-CM | POA: Diagnosis not present

## 2023-10-19 DIAGNOSIS — I2510 Atherosclerotic heart disease of native coronary artery without angina pectoris: Secondary | ICD-10-CM | POA: Diagnosis not present

## 2023-10-19 DIAGNOSIS — Z87891 Personal history of nicotine dependence: Secondary | ICD-10-CM | POA: Diagnosis not present

## 2023-10-19 DIAGNOSIS — Z01812 Encounter for preprocedural laboratory examination: Secondary | ICD-10-CM | POA: Diagnosis not present

## 2023-10-19 DIAGNOSIS — M1712 Unilateral primary osteoarthritis, left knee: Secondary | ICD-10-CM | POA: Diagnosis not present

## 2023-10-19 HISTORY — DX: Pneumonia, unspecified organism: J18.9

## 2023-10-19 HISTORY — DX: Anxiety disorder, unspecified: F41.9

## 2023-10-19 LAB — CBC
HCT: 40.7 % (ref 36.0–46.0)
Hemoglobin: 13.4 g/dL (ref 12.0–15.0)
MCH: 29.5 pg (ref 26.0–34.0)
MCHC: 32.9 g/dL (ref 30.0–36.0)
MCV: 89.6 fL (ref 80.0–100.0)
Platelets: 295 10*3/uL (ref 150–400)
RBC: 4.54 MIL/uL (ref 3.87–5.11)
RDW: 13.4 % (ref 11.5–15.5)
WBC: 6.6 10*3/uL (ref 4.0–10.5)
nRBC: 0 % (ref 0.0–0.2)

## 2023-10-19 LAB — BASIC METABOLIC PANEL
Anion gap: 10 (ref 5–15)
BUN: 18 mg/dL (ref 6–20)
CO2: 24 mmol/L (ref 22–32)
Calcium: 9.4 mg/dL (ref 8.9–10.3)
Chloride: 101 mmol/L (ref 98–111)
Creatinine, Ser: 0.71 mg/dL (ref 0.44–1.00)
GFR, Estimated: >60 mL/min (ref >60)
Glucose, Bld: 110 mg/dL — ABNORMAL HIGH (ref 70–99)
Potassium: 4 mmol/L (ref 3.5–5.1)
Sodium: 135 mmol/L (ref 135–145)

## 2023-10-19 LAB — SURGICAL PCR SCREEN
MRSA, PCR: NEGATIVE
Staphylococcus aureus: NEGATIVE

## 2023-10-20 NOTE — Progress Notes (Signed)
 Anesthesia Chart Review   Case: [id number] Date/Time: 10/28/23 1215   Procedure: ARTHROPLASTY, KNEE, TOTAL (Left: Knee)   Anesthesia type: Spinal   Pre-op diagnosis: Left knee degenerative joint disease   Location: [facility] ROOM 07 / [facility]   Surgeons: Jene Every, MD       DISCUSSION:53 y.o. former smoker with h/o PONV, hypothyroidism, CAD, left knee djd scheduled for above procedure 10/28/2023 with Dr. Jene Every.   Per cardiology preoperative evaluation 06/09/2023, "Preoperative clearance Her perioperative risk of a major cardiac event is 0.4% according to the Revised Cardiac Risk Index (RCRI).  Therefore, the patient is at low risk for perioperative complications.   The patient's  functional capacity is good at 5 METs according to the Duke Activity Status Index (DASI). Recommendations: She has been having a lot of SOB that has limited her ability to be active and likely sedentary state is the likely etiology of her SOB. She has also gained 20lbs in the past year.   -will get coronary CTA and 2D echo for SOB prior to clearance"  Coronary CT 06/10/2023 with mild non-obstructive CAD.   VS: BP (!) 144/93   Pulse 80   Temp 36.7 C (Oral)   Resp 16   Ht 5\' 8"  (1.727 m)   Wt 103.4 kg   SpO2 96%   BMI 34.67 kg/m   PROVIDERS: Sandford Craze, NP is PCP    LABS: Labs reviewed: Acceptable for surgery. (all labs ordered are listed, but only abnormal results are displayed)  Labs Reviewed  BASIC METABOLIC PANEL - Abnormal; Notable for the following components:      Result Value   Glucose, Bld 110 (*)    All other components within normal limits  SURGICAL PCR SCREEN  CBC     IMAGES:   EKG:   CV: Echo 06/15/2023 1. Left ventricular ejection fraction, by estimation, is 60 to 65%. The  left ventricle has normal function. The left ventricle has no regional  wall motion abnormalities. Left ventricular diastolic parameters are  consistent with Grade I diastolic   dysfunction (impaired relaxation).   2. Right ventricular systolic function is normal. The right ventricular  size is normal.   3. The mitral valve is normal in structure. No evidence of mitral valve  regurgitation. No evidence of mitral stenosis.   4. Tricuspid valve regurgitation is mild to moderate.   5. The aortic valve is normal in structure. Aortic valve regurgitation is  mild to moderate. No aortic stenosis is present. Aortic regurgitation PHT  measures 389 msec.   6. The inferior vena cava is normal in size with greater than 50%  respiratory variability, suggesting right atrial pressure of 3 mmHg.  Past Medical History:  Diagnosis Date   Allergy    Anemia    stated h/o pernicious anemia   Anxiety    Aortic insufficiency    mild to moderate   Arthritis    osteoarthritis   Asthma    Acute asthmatic bronchitis 2011 while pregnant   CAD (coronary artery disease), native coronary artery    25% distal LAD, < 25% ostial-prox-mid LAD/distal left main/prox and distal RCA CTA 06/2023   Carpal tunnel syndrome    both wrist   Depression    Diverticulitis    recurrent   Family history of anesthesia complication    nausea and vomiting   Fibromyalgia    GERD (gastroesophageal reflux disease)    Hypothyroidism    Migraine, unspecified, without mention of intractable migraine  without mention of status migrainosus    Miscarriage 2007   Pneumonia    PONV (postoperative nausea and vomiting)    Thyroid disease    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    MEDICATIONS:  acetaminophen  (TYLENOL) 650 MG CR tablet   albuterol (VENTOLIN HFA) 108 (90 Base) MCG/ACT inhaler   Alum Hydroxide-Mag Carbonate (GAVISCON PO)   amLODipine (NORVASC) 5 MG tablet   aspirin EC 81 MG tablet   atorvastatin (LIPITOR) 20 MG tablet   Azelastine-Fluticasone 137-50 MCG/ACT SUSP   calcium carbonate (TUMS - DOSED IN MG ELEMENTAL CALCIUM) 500 MG chewable tablet   cyanocobalamin (VITAMIN B12) 1000 MCG/ML injection   DULoxetine (CYMBALTA) 60 MG capsule   EPINEPHrine (AUVI-Q) 0.3 mg/0.3 mL IJ SOAJ injection   fluticasone-salmeterol (ADVAIR DISKUS) 250-50 MCG/ACT AEPB   levocetirizine (XYZAL) 5 MG tablet   Levonorgestrel (MIRENA, 52 MG, IU)   levothyroxine (SYNTHROID) 125 MCG tablet   loperamide (IMODIUM A-D) 2 MG tablet   meloxicam (MOBIC) 15 MG tablet   montelukast (SINGULAIR) 10 MG tablet   Multiple Vitamins-Minerals (HAIR SKIN AND NAILS FORMULA) TABS   pantoprazole (PROTONIX) 40 MG tablet   Probiotic Product (PROBIOTIC MULTI-ENZYME) TABS   SYRINGE-NEEDLE, DISP, 3 ML (BD INTEGRA SYRINGE) 25G X 1" 3 ML MISC   No current facility-administered medications for this encounter.   Jodell Cipro Ward, PA-C [facility] Pre-Surgical Testing [phone]

## 2023-10-27 ENCOUNTER — Ambulatory Visit: Payer: Self-pay | Admitting: Orthopedic Surgery

## 2023-10-27 NOTE — H&P (View-Only) (Signed)
 Christine [name] is an 53 y.o. female.   Chief Complaint: left knee pain HPI: H&P left TKA scheduled 10/28/23 at [facility] with Dr. Shelle Iron.  Dr. Shelle Iron and the patient mutually agreed to proceed with a total knee replacement. Risks and benefits of the procedure were discussed including stiffness, suboptimal range of motion, persistent pain, infection requiring removal of prosthesis and reinsertion, need for prophylactic antibiotics in the future, for example, dental procedures, possible need for manipulation, revision in the future and also anesthetic complications including DVT, PE, etc. We discussed the perioperative course, time in the hospital, postoperative recovery and the need for elevation to control swelling. We also discussed the predicted range of motion and the probability that squatting and kneeling would be unobtainable in the future. In addition, postoperative anticoagulation was discussed. We have obtained preoperative medical clearance as necessary. Provided illustrated handout and discussed it in detail. They will enroll in the total joint replacement educational forum at the hospital.  Preop scheduled for next week.  Cannot tolerate hydrocodone. Was given a prescription for oxycodone previously but never tried it. Reports some trouble sleeping recently, more pain in the right hip likely secondary to antalgic gait, has asked about maybe trying a muscle relaxer. Does take meloxicam intermittently but not daily. No history of DVT or PE, not currently taking any baby aspirin but does tolerated okay.  Past Medical History:  Diagnosis Date   Allergy    Anemia    stated h/o pernicious anemia   Anxiety    Aortic insufficiency    mild to moderate   Arthritis    osteoarthritis   Asthma    Acute asthmatic bronchitis 2011 while pregnant   CAD (coronary artery disease), native coronary artery    25% distal LAD, < 25% ostial-prox-mid LAD/distal left main/prox and distal RCA CTA 06/2023    Carpal tunnel syndrome    both wrist   Depression    Diverticulitis    recurrent   Family history of anesthesia complication    nausea and vomiting   Fibromyalgia    GERD (gastroesophageal reflux disease)    Hypothyroidism    Migraine, unspecified, without mention of intractable migraine without mention of status migrainosus    Miscarriage 2007   Pneumonia    PONV (postoperative nausea and vomiting)    Thyroid disease    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    Family History  Problem Relation Age of Onset   Barrett's esophagus Mother    Coronary artery disease Mother    Congestive Heart Failure Mother    Hypertension Mother    Fibromyalgia Mother    Chronic fatigue Mother    Anemia Mother    Irritable bowel syndrome Mother    Atrial fibrillation Mother    Colon polyps Father    Diabetes Father    Hypertension Father    Heart disease Father    Graves' disease Maternal Grandmother    CAD Maternal Grandfather    Ovarian cancer Paternal Grandmother    Diabetes Paternal Grandfather    Ehlers-Danlos syndrome Daughter  Anemia Daughter    Other Daughter        Postural orthostatic hypotension   Irritable bowel syndrome Daughter    Diabetes Paternal Aunt    Crohn's disease Cousin    Alcohol abuse Other    Arthritis Other    Hyperlipidemia Other    Hypertension Other    Stroke Other    Coronary artery disease Other    Clotting disorder Other        Great Grandfather   Colon cancer Neg Hx    Social History:  reports that she quit smoking about 23 years ago. Her smoking use  included cigarettes. She started smoking about 26 years ago. She has a 1.5 pack-year smoking history. She has never used smokeless tobacco. She reports current alcohol use of about 2.0 standard drinks of alcohol per week. She reports that she does not use drugs.  Allergies:  Allergies  Allergen Reactions   Hydrocodone Nausea Only and Other (See Comments)    Hallucination    Sulfa Antibiotics Rash   Dilaudid [Hydromorphone Hcl]     Family history of having a reaction to this drug   Latex Rash   Penicillins Rash and Other (See Comments)    Childhood allergy    Current meds: amLODIPine azelastine 137 mcg-fluticasone 50 mcg/spray nasal spray cyanocobalamin (vit B-12) 1,000 mcg/mL injection kit DULoxetine 60 mg capsule,delayed release levocetirizine 5 mg tablet levothyroxine meloxicam 15 mg tablet methocarbamoL 500 mg tablet montelukast 10 mg tablet pantoprazole 40 mg tablet,delayed release Wixela Inhub 250 mcg-50 mcg/dose powder for inhalation  Review of Systems  Constitutional: Negative.   HENT: Negative.    Eyes: Negative.   Respiratory: Negative.    Cardiovascular: Negative.   Gastrointestinal: Negative.   Endocrine: Negative.   Genitourinary: Negative.   Musculoskeletal:  Positive for arthralgias, gait problem and joint swelling.  Skin: Negative.     There were no vitals taken for this visit. Physical Exam Constitutional:      Appearance: Normal appearance.  HENT:     Head: Normocephalic and atraumatic.     Right Ear: External ear normal.     Left Ear: External ear normal.     Nose: Nose normal.     Mouth/Throat:     Pharynx: Oropharynx is clear.  Eyes:     Conjunctiva/sclera: Conjunctivae normal.  Cardiovascular:     Rate and Rhythm: Normal rate and regular rhythm.     Pulses: Normal pulses.     Heart sounds: Normal heart sounds.  Pulmonary:     Effort: Pulmonary effort is normal.     Breath sounds: Normal breath sounds.  Abdominal:     General: Bowel  sounds are normal.  Musculoskeletal:     Cervical back: Normal range of motion and neck supple.     Comments: Left knee tender medial joint line patellofemoral pain compression ranges -5-100. No DVT mild effusion. Ipsilateral hip and ankle exams unremarkable  Skin:    General: Skin is warm and dry.  Neurological:     Mental Status: She is alert.   Standing L knee radiographs from 12/10/2022 demonstrate end-stage osteoarthrosis medial compartment left knee. Moderately severe on the right.   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 Shelle Iron on March 28. We again discussed the procedure itself as well as risks, complications and alternatives, including but not limited to  DVT, PE, infx, bleeding, failure of procedure, need for secondary procedure including manipulation, nerve injury, ongoing pain/symptoms, anesthesia risk, even stroke or death. Also discussed typical post-op protocols, activity restrictions, need for PT, flexion/extension exercises, time out of work. Discussed need for DVT ppx post-op per protocol. Discussed dental ppx and infx prevention. Also discussed limitations post-operatively such as kneeling and squatting. All questions were answered. Patient desires to proceed with surgery as scheduled.  Will hold supplements, ASA and NSAIDs accordingly. Will remain NPO after midnight the night before surgery. Will present to [facility] for pre-op testing. Anticipate hospital stay to include at least 2 midnights given medical history and to ensure proper pain control. Plan aspirin for DVT ppx post-op. Plan oxycodone, Robaxin, Colace, Miralax. Plan home with HHPT post-op with family members at home for assistance, [facility] placed with [facility]. When she is ready to transition to outpatient PT [facility] is close and  convenient for her.. Will follow up 10-14 days post-op for suture removal and xrays.  Plan left total knee replacement  Dorothy Spark, PA-C for Dr Shelle Iron 10/27/2023, 2:14 PM

## 2023-10-27 NOTE — H&P (Signed)
 Christine [name] is an 53 y.o. female.   Chief Complaint: left knee pain HPI: H&P left TKA scheduled 10/28/23 at [facility] with Dr. Shelle Iron.  Dr. Shelle Iron and the patient mutually agreed to proceed with a total knee replacement. Risks and benefits of the procedure were discussed including stiffness, suboptimal range of motion, persistent pain, infection requiring removal of prosthesis and reinsertion, need for prophylactic antibiotics in the future, for example, dental procedures, possible need for manipulation, revision in the future and also anesthetic complications including DVT, PE, etc. We discussed the perioperative course, time in the hospital, postoperative recovery and the need for elevation to control swelling. We also discussed the predicted range of motion and the probability that squatting and kneeling would be unobtainable in the future. In addition, postoperative anticoagulation was discussed. We have obtained preoperative medical clearance as necessary. Provided illustrated handout and discussed it in detail. They will enroll in the total joint replacement educational forum at the hospital.  Preop scheduled for next week.  Cannot tolerate hydrocodone. Was given a prescription for oxycodone previously but never tried it. Reports some trouble sleeping recently, more pain in the right hip likely secondary to antalgic gait, has asked about maybe trying a muscle relaxer. Does take meloxicam intermittently but not daily. No history of DVT or PE, not currently taking any baby aspirin but does tolerated okay.  Past Medical History:  Diagnosis Date   Allergy    Anemia    stated h/o pernicious anemia   Anxiety    Aortic insufficiency    mild to moderate   Arthritis    osteoarthritis   Asthma    Acute asthmatic bronchitis 2011 while pregnant   CAD (coronary artery disease), native coronary artery    25% distal LAD, < 25% ostial-prox-mid LAD/distal left main/prox and distal RCA CTA 06/2023    Carpal tunnel syndrome    both wrist   Depression    Diverticulitis    recurrent   Family history of anesthesia complication    nausea and vomiting   Fibromyalgia    GERD (gastroesophageal reflux disease)    Hypothyroidism    Migraine, unspecified, without mention of intractable migraine without mention of status migrainosus    Miscarriage 2007   Pneumonia    PONV (postoperative nausea and vomiting)    Thyroid disease    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    Family History  Problem Relation Age of Onset   Barrett's esophagus Mother    Coronary artery disease Mother    Congestive Heart Failure Mother    Hypertension Mother    Fibromyalgia Mother    Chronic fatigue Mother    Anemia Mother    Irritable bowel syndrome Mother    Atrial fibrillation Mother    Colon polyps Father    Diabetes Father    Hypertension Father    Heart disease Father    Graves' disease Maternal Grandmother    CAD Maternal Grandfather    Ovarian cancer Paternal Grandmother    Diabetes Paternal Grandfather    Ehlers-Danlos syndrome Daughter  Anemia Daughter    Other Daughter        Postural orthostatic hypotension   Irritable bowel syndrome Daughter    Diabetes Paternal Aunt    Crohn's disease Cousin    Alcohol abuse Other    Arthritis Other    Hyperlipidemia Other    Hypertension Other    Stroke Other    Coronary artery disease Other    Clotting disorder Other        Great Grandfather   Colon cancer Neg Hx    Social History:  reports that she quit smoking about 23 years ago. Her smoking use  included cigarettes. She started smoking about 26 years ago. She has a 1.5 pack-year smoking history. She has never used smokeless tobacco. She reports current alcohol use of about 2.0 standard drinks of alcohol per week. She reports that she does not use drugs.  Allergies:  Allergies  Allergen Reactions   Hydrocodone Nausea Only and Other (See Comments)    Hallucination    Sulfa Antibiotics Rash   Dilaudid [Hydromorphone Hcl]     Family history of having a reaction to this drug   Latex Rash   Penicillins Rash and Other (See Comments)    Childhood allergy    Current meds: amLODIPine azelastine 137 mcg-fluticasone 50 mcg/spray nasal spray cyanocobalamin (vit B-12) 1,000 mcg/mL injection kit DULoxetine 60 mg capsule,delayed release levocetirizine 5 mg tablet levothyroxine meloxicam 15 mg tablet methocarbamoL 500 mg tablet montelukast 10 mg tablet pantoprazole 40 mg tablet,delayed release Wixela Inhub 250 mcg-50 mcg/dose powder for inhalation  Review of Systems  Constitutional: Negative.   HENT: Negative.    Eyes: Negative.   Respiratory: Negative.    Cardiovascular: Negative.   Gastrointestinal: Negative.   Endocrine: Negative.   Genitourinary: Negative.   Musculoskeletal:  Positive for arthralgias, gait problem and joint swelling.  Skin: Negative.     There were no vitals taken for this visit. Physical Exam Constitutional:      Appearance: Normal appearance.  HENT:     Head: Normocephalic and atraumatic.     Right Ear: External ear normal.     Left Ear: External ear normal.     Nose: Nose normal.     Mouth/Throat:     Pharynx: Oropharynx is clear.  Eyes:     Conjunctiva/sclera: Conjunctivae normal.  Cardiovascular:     Rate and Rhythm: Normal rate and regular rhythm.     Pulses: Normal pulses.     Heart sounds: Normal heart sounds.  Pulmonary:     Effort: Pulmonary effort is normal.     Breath sounds: Normal breath sounds.  Abdominal:     General: Bowel  sounds are normal.  Musculoskeletal:     Cervical back: Normal range of motion and neck supple.     Comments: Left knee tender medial joint line patellofemoral pain compression ranges -5-100. No DVT mild effusion. Ipsilateral hip and ankle exams unremarkable  Skin:    General: Skin is warm and dry.  Neurological:     Mental Status: She is alert.   Standing L knee radiographs from 12/10/2022 demonstrate end-stage osteoarthrosis medial compartment left knee. Moderately severe on the right.   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 Shelle Iron on March 28. We again discussed the procedure itself as well as risks, complications and alternatives, including but not limited to  DVT, PE, infx, bleeding, failure of procedure, need for secondary procedure including manipulation, nerve injury, ongoing pain/symptoms, anesthesia risk, even stroke or death. Also discussed typical post-op protocols, activity restrictions, need for PT, flexion/extension exercises, time out of work. Discussed need for DVT ppx post-op per protocol. Discussed dental ppx and infx prevention. Also discussed limitations post-operatively such as kneeling and squatting. All questions were answered. Patient desires to proceed with surgery as scheduled.  Will hold supplements, ASA and NSAIDs accordingly. Will remain NPO after midnight the night before surgery. Will present to [facility] for pre-op testing. Anticipate hospital stay to include at least 2 midnights given medical history and to ensure proper pain control. Plan aspirin for DVT ppx post-op. Plan oxycodone, Robaxin, Colace, Miralax. Plan home with HHPT post-op with family members at home for assistance, order placed with [facility]. When she is ready to transition to outpatient PT [facility] is close and  convenient for her.. Will follow up 10-14 days post-op for suture removal and xrays.  Plan left total knee replacement  Dorothy Spark, PA-C for Dr Shelle Iron 10/27/2023, 2:14 PM

## 2023-10-28 ENCOUNTER — Ambulatory Visit (HOSPITAL_COMMUNITY)

## 2023-10-28 ENCOUNTER — Ambulatory Visit (HOSPITAL_COMMUNITY): Payer: Self-pay | Admitting: Anesthesiology

## 2023-10-28 ENCOUNTER — Encounter (HOSPITAL_COMMUNITY)
Admission: RE | Disposition: A | Discharge status: Home / Self Care | Payer: Self-pay | Source: Home / Self Care | Attending: Specialist

## 2023-10-28 ENCOUNTER — Other Ambulatory Visit: Payer: Self-pay

## 2023-10-28 ENCOUNTER — Ambulatory Visit (HOSPITAL_COMMUNITY): Payer: Self-pay | Admitting: Physician Assistant

## 2023-10-28 ENCOUNTER — Ambulatory Visit (HOSPITAL_COMMUNITY)
Admission: RE | Admit: 2023-10-28 | Discharge: 2023-10-29 | Disposition: A | Discharge status: Home / Self Care | Payer: BC Managed Care – PPO | Attending: Specialist | Admitting: Specialist

## 2023-10-28 ENCOUNTER — Encounter (HOSPITAL_COMMUNITY): Payer: Self-pay | Admitting: Specialist

## 2023-10-28 DIAGNOSIS — K219 Gastro-esophageal reflux disease without esophagitis: Secondary | ICD-10-CM | POA: Diagnosis not present

## 2023-10-28 DIAGNOSIS — I2510 Atherosclerotic heart disease of native coronary artery without angina pectoris: Secondary | ICD-10-CM | POA: Diagnosis not present

## 2023-10-28 DIAGNOSIS — M1712 Unilateral primary osteoarthritis, left knee: Secondary | ICD-10-CM | POA: Diagnosis not present

## 2023-10-28 DIAGNOSIS — M21162 Varus deformity, not elsewhere classified, left knee: Secondary | ICD-10-CM | POA: Insufficient documentation

## 2023-10-28 DIAGNOSIS — Z87891 Personal history of nicotine dependence: Secondary | ICD-10-CM | POA: Insufficient documentation

## 2023-10-28 DIAGNOSIS — Z79899 Other long term (current) drug therapy: Secondary | ICD-10-CM | POA: Diagnosis not present

## 2023-10-28 DIAGNOSIS — Z471 Aftercare following joint replacement surgery: Secondary | ICD-10-CM | POA: Diagnosis not present

## 2023-10-28 DIAGNOSIS — Z96652 Presence of left artificial knee joint: Secondary | ICD-10-CM | POA: Diagnosis not present

## 2023-10-28 DIAGNOSIS — E669 Obesity, unspecified: Secondary | ICD-10-CM | POA: Diagnosis not present

## 2023-10-28 DIAGNOSIS — I10 Essential (primary) hypertension: Secondary | ICD-10-CM | POA: Diagnosis not present

## 2023-10-28 DIAGNOSIS — Z6834 Body mass index (BMI) 34.0-34.9, adult: Secondary | ICD-10-CM | POA: Insufficient documentation

## 2023-10-28 DIAGNOSIS — Z01818 Encounter for other preprocedural examination: Secondary | ICD-10-CM

## 2023-10-28 DIAGNOSIS — G8918 Other acute postprocedural pain: Secondary | ICD-10-CM | POA: Diagnosis not present

## 2023-10-28 HISTORY — PX: TOTAL KNEE ARTHROPLASTY: SHX125

## 2023-10-28 SURGERY — ARTHROPLASTY, KNEE, TOTAL
Anesthesia: Spinal | Site: Knee | Laterality: Left

## 2023-10-28 MED ORDER — LIDOCAINE HCL (CARDIAC) PF 100 MG/5ML IV SOSY
PREFILLED_SYRINGE | INTRAVENOUS | Status: DC | PRN
  Administered 2023-10-28: 30 mg via INTRAVENOUS

## 2023-10-28 MED ORDER — DOCUSATE SODIUM 100 MG PO CAPS: 100.0000 mg | ORAL_CAPSULE | Freq: Two times a day (BID) | ORAL | 2 refills | Status: DC

## 2023-10-28 MED ORDER — SODIUM CHLORIDE 0.9 % IR SOLN
Status: DC | PRN
  Administered 2023-10-28: 500 mL
  Administered 2023-10-28: 1000 mL

## 2023-10-28 MED ORDER — LEVOTHYROXINE SODIUM 125 MCG PO TABS
125.0000 ug | ORAL_TABLET | Freq: Every day | ORAL | Status: DC
  Administered 2023-10-29: 125 ug via ORAL
  Filled 2023-10-28: qty 1

## 2023-10-28 MED ORDER — AZELASTINE-FLUTICASONE 137-50 MCG/ACT NA SUSP
1.0000 | Freq: Two times a day (BID) | NASAL | Status: DC
  Administered 2023-10-28 – 2023-10-29 (×2): 1 via NASAL

## 2023-10-28 MED ORDER — OXYCODONE HCL 5 MG PO TABS: 10.0000 mg | ORAL_TABLET | ORAL | Status: DC | PRN

## 2023-10-28 MED ORDER — KCL IN DEXTROSE-NACL 20-5-0.45 MEQ/L-%-% IV SOLN
INTRAVENOUS | Status: DC
  Filled 2023-10-28 (×2): qty 1000

## 2023-10-28 MED ORDER — FENTANYL CITRATE PF 50 MCG/ML IJ SOSY
PREFILLED_SYRINGE | INTRAMUSCULAR | Status: AC
  Filled 2023-10-28: qty 2

## 2023-10-28 MED ORDER — OXYCODONE HCL 5 MG PO TABS
ORAL_TABLET | ORAL | Status: AC
  Administered 2023-10-28: 5 mg
  Filled 2023-10-28: qty 1

## 2023-10-28 MED ORDER — MAGNESIUM CITRATE PO SOLN: 1.0000 | Freq: Once | ORAL | Status: DC | PRN

## 2023-10-28 MED ORDER — SACCHAROMYCES BOULARDII 250 MG PO CAPS: 250.0000 mg | ORAL_CAPSULE | Freq: Every day | ORAL | Status: DC | PRN

## 2023-10-28 MED ORDER — CLONIDINE HCL (ANALGESIA) 100 MCG/ML EP SOLN
EPIDURAL | Status: DC | PRN
  Administered 2023-10-28: 50 ug

## 2023-10-28 MED ORDER — LOPERAMIDE HCL 2 MG PO CAPS: 2.0000 mg | ORAL_CAPSULE | Freq: Four times a day (QID) | ORAL | Status: DC | PRN

## 2023-10-28 MED ORDER — METHOCARBAMOL 1000 MG/10ML IJ SOLN
INTRAMUSCULAR | Status: AC
  Filled 2023-10-28: qty 10

## 2023-10-28 MED ORDER — METHOCARBAMOL 500 MG PO TABS
500.0000 mg | ORAL_TABLET | Freq: Four times a day (QID) | ORAL | Status: DC | PRN
  Administered 2023-10-29 (×2): 500 mg via ORAL
  Filled 2023-10-28 (×2): qty 1

## 2023-10-28 MED ORDER — LORATADINE 10 MG PO TABS
10.0000 mg | ORAL_TABLET | Freq: Every evening | ORAL | Status: DC
  Administered 2023-10-28: 10 mg via ORAL
  Filled 2023-10-28: qty 1

## 2023-10-28 MED ORDER — ORAL CARE MOUTH RINSE: 15.0000 mL | Freq: Once | OROMUCOSAL | Status: AC

## 2023-10-28 MED ORDER — ONDANSETRON HCL 4 MG/2ML IJ SOLN: 4.0000 mg | Freq: Four times a day (QID) | INTRAMUSCULAR | Status: DC | PRN

## 2023-10-28 MED ORDER — BUPIVACAINE-EPINEPHRINE (PF) 0.25% -1:200000 IJ SOLN
INTRAMUSCULAR | Status: AC
  Filled 2023-10-28: qty 30

## 2023-10-28 MED ORDER — ALUM & MAG HYDROXIDE-SIMETH 200-200-20 MG/5ML PO SUSP: 30.0000 mL | ORAL | Status: DC | PRN

## 2023-10-28 MED ORDER — DULOXETINE HCL 60 MG PO CPEP
60.0000 mg | ORAL_CAPSULE | Freq: Every day | ORAL | Status: DC
  Administered 2023-10-28: 60 mg via ORAL
  Filled 2023-10-28 (×2): qty 1

## 2023-10-28 MED ORDER — LEVONORGESTREL 20 MCG/DAY IU IUD: 1.0000 | INTRAUTERINE_SYSTEM | Freq: Once | INTRAUTERINE | Status: DC

## 2023-10-28 MED ORDER — PROPOFOL 500 MG/50ML IV EMUL
INTRAVENOUS | Status: AC
  Filled 2023-10-28: qty 50

## 2023-10-28 MED ORDER — BUPIVACAINE IN DEXTROSE 0.75-8.25 % IT SOLN
INTRATHECAL | Status: DC | PRN
  Administered 2023-10-28: 1.6 mL via INTRATHECAL

## 2023-10-28 MED ORDER — DEXAMETHASONE SODIUM PHOSPHATE 10 MG/ML IJ SOLN
INTRAMUSCULAR | Status: DC | PRN
  Administered 2023-10-28: 4 mg via INTRAVENOUS

## 2023-10-28 MED ORDER — ONDANSETRON HCL 4 MG/2ML IJ SOLN: 4.0000 mg | Freq: Once | INTRAMUSCULAR | Status: DC | PRN

## 2023-10-28 MED ORDER — SODIUM CHLORIDE (PF) 0.9 % IJ SOLN
INTRAMUSCULAR | Status: AC
  Filled 2023-10-28: qty 50

## 2023-10-28 MED ORDER — ONDANSETRON HCL 4 MG/2ML IJ SOLN
INTRAMUSCULAR | Status: AC
  Filled 2023-10-28: qty 2

## 2023-10-28 MED ORDER — ACETAMINOPHEN 325 MG PO TABS: 325.0000 mg | ORAL_TABLET | Freq: Four times a day (QID) | ORAL | Status: DC | PRN

## 2023-10-28 MED ORDER — POLYETHYLENE GLYCOL 3350 17 G PO PACK: 17.0000 g | PACK | Freq: Every day | ORAL | 0 refills | Status: DC

## 2023-10-28 MED ORDER — STERILE WATER FOR IRRIGATION IR SOLN
Status: DC | PRN
  Administered 2023-10-28: 1000 mL

## 2023-10-28 MED ORDER — OXYCODONE HCL 5 MG PO TABS
5.0000 mg | ORAL_TABLET | ORAL | Status: DC | PRN
  Administered 2023-10-28: 10 mg via ORAL
  Administered 2023-10-29 (×2): 5 mg via ORAL
  Filled 2023-10-28: qty 2
  Filled 2023-10-28 (×2): qty 1

## 2023-10-28 MED ORDER — METHOCARBAMOL 500 MG PO TABS: 500.0000 mg | ORAL_TABLET | Freq: Three times a day (TID) | ORAL | 1 refills | Status: DC | PRN

## 2023-10-28 MED ORDER — PROPOFOL 1000 MG/100ML IV EMUL
INTRAVENOUS | Status: AC
  Filled 2023-10-28: qty 100

## 2023-10-28 MED ORDER — FENTANYL CITRATE PF 50 MCG/ML IJ SOSY
50.0000 ug | PREFILLED_SYRINGE | INTRAMUSCULAR | Status: DC
  Administered 2023-10-28: 50 ug via INTRAVENOUS
  Filled 2023-10-28: qty 2

## 2023-10-28 MED ORDER — METHOCARBAMOL 1000 MG/10ML IJ SOLN: 500.0000 mg | Freq: Four times a day (QID) | INTRAMUSCULAR | Status: DC | PRN

## 2023-10-28 MED ORDER — POLYETHYLENE GLYCOL 3350 17 G PO PACK: 17.0000 g | PACK | Freq: Every day | ORAL | Status: DC | PRN

## 2023-10-28 MED ORDER — METOCLOPRAMIDE HCL 5 MG/ML IJ SOLN: 5.0000 mg | Freq: Three times a day (TID) | INTRAMUSCULAR | Status: DC | PRN

## 2023-10-28 MED ORDER — AMISULPRIDE (ANTIEMETIC) 5 MG/2ML IV SOLN: 10.0000 mg | Freq: Once | INTRAVENOUS | Status: DC | PRN

## 2023-10-28 MED ORDER — MOMETASONE FURO-FORMOTEROL FUM 200-5 MCG/ACT IN AERO
2.0000 | INHALATION_SPRAY | Freq: Two times a day (BID) | RESPIRATORY_TRACT | Status: DC
  Filled 2023-10-28: qty 8.8

## 2023-10-28 MED ORDER — ROPIVACAINE HCL 5 MG/ML IJ SOLN
INTRAMUSCULAR | Status: DC | PRN
  Administered 2023-10-28: 20 mL via PERINEURAL

## 2023-10-28 MED ORDER — ACETAMINOPHEN 500 MG PO TABS
1000.0000 mg | ORAL_TABLET | Freq: Four times a day (QID) | ORAL | Status: AC
  Administered 2023-10-28 – 2023-10-29 (×4): 1000 mg via ORAL
  Filled 2023-10-28 (×4): qty 2

## 2023-10-28 MED ORDER — 0.9 % SODIUM CHLORIDE (POUR BTL) OPTIME
TOPICAL | Status: DC | PRN
  Administered 2023-10-28: 1000 mL

## 2023-10-28 MED ORDER — FENTANYL CITRATE PF 50 MCG/ML IJ SOSY
25.0000 ug | PREFILLED_SYRINGE | INTRAMUSCULAR | Status: DC | PRN
  Administered 2023-10-28 (×3): 50 ug via INTRAVENOUS

## 2023-10-28 MED ORDER — PROPOFOL 10 MG/ML IV BOLUS
INTRAVENOUS | Status: DC | PRN
  Administered 2023-10-28 (×2): 10 mg via INTRAVENOUS

## 2023-10-28 MED ORDER — MENTHOL 3 MG MT LOZG: 1.0000 | LOZENGE | OROMUCOSAL | Status: DC | PRN

## 2023-10-28 MED ORDER — PHENYLEPHRINE HCL-NACL 20-0.9 MG/250ML-% IV SOLN
INTRAVENOUS | Status: AC
  Filled 2023-10-28: qty 250

## 2023-10-28 MED ORDER — TRANEXAMIC ACID-NACL 1000-0.7 MG/100ML-% IV SOLN
1000.0000 mg | INTRAVENOUS | Status: AC
Start: 2023-10-28 — End: 2023-10-28
  Administered 2023-10-28: 1000 mg via INTRAVENOUS
  Filled 2023-10-28: qty 100

## 2023-10-28 MED ORDER — ATORVASTATIN CALCIUM 20 MG PO TABS
20.0000 mg | ORAL_TABLET | Freq: Every day | ORAL | Status: DC
Start: 2023-10-28 — End: 2023-10-28

## 2023-10-28 MED ORDER — MIDAZOLAM HCL 2 MG/2ML IJ SOLN
1.0000 mg | INTRAMUSCULAR | Status: DC
  Administered 2023-10-28: 2 mg via INTRAVENOUS
  Filled 2023-10-28: qty 2

## 2023-10-28 MED ORDER — MIDAZOLAM HCL 5 MG/5ML IJ SOLN
INTRAMUSCULAR | Status: DC | PRN
  Administered 2023-10-28: 1 mg via INTRAVENOUS

## 2023-10-28 MED ORDER — ACETAMINOPHEN 500 MG PO TABS
1000.0000 mg | ORAL_TABLET | Freq: Once | ORAL | Status: AC
  Administered 2023-10-28: 1000 mg via ORAL
  Filled 2023-10-28: qty 2

## 2023-10-28 MED ORDER — LACTATED RINGERS IV SOLN: INTRAVENOUS | Status: DC

## 2023-10-28 MED ORDER — BUPIVACAINE-EPINEPHRINE 0.25% -1:200000 IJ SOLN
INTRAMUSCULAR | Status: DC | PRN
  Administered 2023-10-28: 30 mL

## 2023-10-28 MED ORDER — EPINEPHRINE 0.3 MG/0.3ML IJ SOAJ: 0.3000 mg | INTRAMUSCULAR | Status: DC | PRN

## 2023-10-28 MED ORDER — AMLODIPINE BESYLATE 5 MG PO TABS
7.5000 mg | ORAL_TABLET | Freq: Every day | ORAL | Status: DC
  Administered 2023-10-28: 7.5 mg via ORAL
  Filled 2023-10-28: qty 2

## 2023-10-28 MED ORDER — SODIUM CHLORIDE 0.9% FLUSH
INTRAVENOUS | Status: DC | PRN
  Administered 2023-10-28: 30 mL

## 2023-10-28 MED ORDER — BUPIVACAINE LIPOSOME 1.3 % IJ SUSP
INTRAMUSCULAR | Status: AC
  Filled 2023-10-28: qty 20

## 2023-10-28 MED ORDER — FENTANYL CITRATE (PF) 250 MCG/5ML IJ SOLN
INTRAMUSCULAR | Status: DC | PRN
  Administered 2023-10-28: 50 ug via INTRAVENOUS

## 2023-10-28 MED ORDER — LIDOCAINE HCL (PF) 2 % IJ SOLN
INTRAMUSCULAR | Status: AC
  Filled 2023-10-28: qty 5

## 2023-10-28 MED ORDER — FENTANYL CITRATE PF 50 MCG/ML IJ SOSY
PREFILLED_SYRINGE | INTRAMUSCULAR | Status: AC
  Filled 2023-10-28: qty 1

## 2023-10-28 MED ORDER — MIDAZOLAM HCL 2 MG/2ML IJ SOLN
INTRAMUSCULAR | Status: AC
  Filled 2023-10-28: qty 2

## 2023-10-28 MED ORDER — DOCUSATE SODIUM 100 MG PO CAPS
100.0000 mg | ORAL_CAPSULE | Freq: Two times a day (BID) | ORAL | Status: DC
  Administered 2023-10-28 – 2023-10-29 (×2): 100 mg via ORAL
  Filled 2023-10-28 (×2): qty 1

## 2023-10-28 MED ORDER — CEFAZOLIN SODIUM-DEXTROSE 2-4 GM/100ML-% IV SOLN
2.0000 g | INTRAVENOUS | Status: AC
Start: 2023-10-28 — End: 2023-10-28
  Administered 2023-10-28: 2 g via INTRAVENOUS
  Filled 2023-10-28: qty 100

## 2023-10-28 MED ORDER — PHENOL 1.4 % MT LIQD: 1.0000 | OROMUCOSAL | Status: DC | PRN

## 2023-10-28 MED ORDER — RISAQUAD PO CAPS
1.0000 | ORAL_CAPSULE | Freq: Every day | ORAL | Status: DC
  Administered 2023-10-28 – 2023-10-29 (×2): 1 via ORAL
  Filled 2023-10-28 (×2): qty 1

## 2023-10-28 MED ORDER — PANTOPRAZOLE SODIUM 40 MG PO TBEC
40.0000 mg | DELAYED_RELEASE_TABLET | Freq: Every day | ORAL | Status: DC
  Administered 2023-10-28 – 2023-10-29 (×2): 40 mg via ORAL
  Filled 2023-10-28 (×2): qty 1

## 2023-10-28 MED ORDER — ONDANSETRON HCL 4 MG PO TABS: 4.0000 mg | ORAL_TABLET | Freq: Four times a day (QID) | ORAL | Status: DC | PRN

## 2023-10-28 MED ORDER — ASPIRIN 81 MG PO TBEC: 81.0000 mg | DELAYED_RELEASE_TABLET | Freq: Two times a day (BID) | ORAL | 3 refills | Status: DC

## 2023-10-28 MED ORDER — KETOROLAC TROMETHAMINE 30 MG/ML IJ SOLN
INTRAMUSCULAR | Status: AC
  Filled 2023-10-28: qty 1

## 2023-10-28 MED ORDER — FENTANYL CITRATE (PF) 100 MCG/2ML IJ SOLN
INTRAMUSCULAR | Status: AC
  Filled 2023-10-28: qty 2

## 2023-10-28 MED ORDER — ACETAMINOPHEN 10 MG/ML IV SOLN: 1000.0000 mg | INTRAVENOUS | Status: DC

## 2023-10-28 MED ORDER — METOCLOPRAMIDE HCL 5 MG PO TABS: 5.0000 mg | ORAL_TABLET | Freq: Three times a day (TID) | ORAL | Status: DC | PRN

## 2023-10-28 MED ORDER — DEXAMETHASONE SODIUM PHOSPHATE 10 MG/ML IJ SOLN
INTRAMUSCULAR | Status: AC
  Filled 2023-10-28: qty 1

## 2023-10-28 MED ORDER — OXYCODONE HCL 5 MG PO TABS: 5.0000 mg | ORAL_TABLET | ORAL | 0 refills | Status: DC | PRN

## 2023-10-28 MED ORDER — MONTELUKAST SODIUM 10 MG PO TABS
10.0000 mg | ORAL_TABLET | Freq: Every day | ORAL | Status: DC
  Administered 2023-10-28: 10 mg via ORAL
  Filled 2023-10-28: qty 1

## 2023-10-28 MED ORDER — DIPHENHYDRAMINE HCL 12.5 MG/5ML PO ELIX: 12.5000 mg | ORAL_SOLUTION | ORAL | Status: DC | PRN

## 2023-10-28 MED ORDER — PROPOFOL 500 MG/50ML IV EMUL
INTRAVENOUS | Status: DC | PRN
  Administered 2023-10-28: 50 ug/kg/min via INTRAVENOUS

## 2023-10-28 MED ORDER — ALBUTEROL SULFATE (2.5 MG/3ML) 0.083% IN NEBU: 3.0000 mL | INHALATION_SOLUTION | Freq: Four times a day (QID) | RESPIRATORY_TRACT | Status: DC | PRN

## 2023-10-28 MED ORDER — BUPIVACAINE LIPOSOME 1.3 % IJ SUSP
INTRAMUSCULAR | Status: DC | PRN
  Administered 2023-10-28: 30 mL

## 2023-10-28 MED ORDER — ONDANSETRON HCL 4 MG/2ML IJ SOLN
INTRAMUSCULAR | Status: DC | PRN
  Administered 2023-10-28: 4 mg via INTRAVENOUS

## 2023-10-28 MED ORDER — ASPIRIN 81 MG PO CHEW
81.0000 mg | CHEWABLE_TABLET | Freq: Two times a day (BID) | ORAL | Status: DC
  Administered 2023-10-29: 81 mg via ORAL
  Filled 2023-10-28: qty 1

## 2023-10-28 MED ORDER — CHLORHEXIDINE GLUCONATE 0.12 % MT SOLN
15.0000 mL | Freq: Once | OROMUCOSAL | Status: AC
  Administered 2023-10-28: 15 mL via OROMUCOSAL

## 2023-10-28 MED ORDER — BISACODYL 5 MG PO TBEC: 5.0000 mg | DELAYED_RELEASE_TABLET | Freq: Every day | ORAL | Status: DC | PRN

## 2023-10-28 MED ORDER — KETOROLAC TROMETHAMINE 30 MG/ML IJ SOLN
30.0000 mg | Freq: Once | INTRAMUSCULAR | Status: AC
  Administered 2023-10-28: 30 mg via INTRAVENOUS

## 2023-10-28 MED ORDER — PHENYLEPHRINE HCL (PRESSORS) 10 MG/ML IV SOLN
INTRAVENOUS | Status: DC | PRN
  Administered 2023-10-28 (×2): 80 ug via INTRAVENOUS
  Administered 2023-10-28: 100 ug via INTRAVENOUS

## 2023-10-28 MED ORDER — CEFAZOLIN SODIUM-DEXTROSE 2-4 GM/100ML-% IV SOLN
2.0000 g | Freq: Four times a day (QID) | INTRAVENOUS | Status: AC
  Administered 2023-10-28 – 2023-10-29 (×2): 2 g via INTRAVENOUS
  Filled 2023-10-28 (×2): qty 100

## 2023-10-28 MED ORDER — METHOCARBAMOL 1000 MG/10ML IJ SOLN
500.0000 mg | Freq: Once | INTRAMUSCULAR | Status: AC
  Administered 2023-10-28: 500 mg via INTRAVENOUS

## 2023-10-28 SURGICAL SUPPLY — 62 items
ATTUNE MED DOME PAT 32 KNEE (Knees) IMPLANT
ATTUNE PS FEM LT SZ 5 CEM KNEE (Femur) IMPLANT
ATTUNE PSRP INSR SZ5 5 KNEE (Insert) IMPLANT
BAG COUNTER SPONGE SURGICOUNT (BAG) IMPLANT
BAG ZIPLOCK 12X15 (MISCELLANEOUS) IMPLANT
BASEPLATE TIBIAL ROTATING SZ 4 (Knees) IMPLANT
BLADE SAW SGTL 11.0X1.19X90.0M (BLADE) ×2 IMPLANT
BLADE SAW SGTL 13.0X1.19X90.0M (BLADE) ×2 IMPLANT
BLADE SURG SZ10 CARB STEEL (BLADE) ×4 IMPLANT
BNDG ELASTIC 4INX 5YD STR LF (GAUZE/BANDAGES/DRESSINGS) ×2 IMPLANT
BNDG ELASTIC 6INX 5YD STR LF (GAUZE/BANDAGES/DRESSINGS) ×2 IMPLANT
BNDG ELASTIC 6X10 VLCR STRL LF (GAUZE/BANDAGES/DRESSINGS) IMPLANT
BOWL SMART MIX CTS (DISPOSABLE) ×2 IMPLANT
CEMENT HV SMART SET (Cement) ×4 IMPLANT
COVER SURGICAL LIGHT HANDLE (MISCELLANEOUS) ×2 IMPLANT
CUFF TRNQT CYL 34X4.125X (TOURNIQUET CUFF) ×2 IMPLANT
DRAPE INCISE IOBAN 66X45 STRL (DRAPES) IMPLANT
DRAPE SHEET LG 3/4 BI-LAMINATE (DRAPES) ×2 IMPLANT
DRAPE SURG ORHT 6 SPLT 77X108 (DRAPES) ×4 IMPLANT
DRAPE TOP 10253 STERILE (DRAPES) ×2 IMPLANT
DRAPE U-SHAPE 47X51 STRL (DRAPES) ×2 IMPLANT
DRSG AQUACEL AG ADV 3.5X10 (GAUZE/BANDAGES/DRESSINGS) ×2 IMPLANT
DRSG TEGADERM 4X4.75 (GAUZE/BANDAGES/DRESSINGS) IMPLANT
DURAPREP 26ML APPLICATOR (WOUND CARE) ×2 IMPLANT
ELECT BLADE TIP CTD 4 INCH (ELECTRODE) ×2 IMPLANT
ELECT REM PT RETURN 15FT ADLT (MISCELLANEOUS) ×2 IMPLANT
EVACUATOR 1/8 PVC DRAIN (DRAIN) IMPLANT
GAUZE SPONGE 2X2 8PLY STRL LF (GAUZE/BANDAGES/DRESSINGS) IMPLANT
GLOVE BIO SURGEON STRL SZ7 (GLOVE) ×2 IMPLANT
GLOVE BIOGEL PI IND STRL 7.0 (GLOVE) ×2 IMPLANT
GLOVE BIOGEL PI IND STRL 8 (GLOVE) ×2 IMPLANT
GLOVE SURG SS PI 8.0 STRL IVOR (GLOVE) ×2 IMPLANT
GOWN STRL REUS W/ TWL XL LVL3 (GOWN DISPOSABLE) ×4 IMPLANT
HEMOSTAT SPONGE AVITENE ULTRA (HEMOSTASIS) IMPLANT
HOLDER FOLEY CATH W/STRAP (MISCELLANEOUS) ×2 IMPLANT
IMMOBILIZER KNEE 20 (SOFTGOODS) ×1 IMPLANT
IMMOBILIZER KNEE 20 THIGH 36 (SOFTGOODS) ×2 IMPLANT
KIT TURNOVER KIT A (KITS) IMPLANT
MANIFOLD NEPTUNE II (INSTRUMENTS) ×2 IMPLANT
NS IRRIG 1000ML POUR BTL (IV SOLUTION) IMPLANT
PACK TOTAL KNEE CUSTOM (KITS) ×2 IMPLANT
PIN STEINMAN FIXATION KNEE (PIN) IMPLANT
PROTECTOR NERVE ULNAR (MISCELLANEOUS) ×2 IMPLANT
SAW OSC TIP CART 19.5X105X1.3 (SAW) IMPLANT
SEALER BIPOLAR AQUA 6.0 (INSTRUMENTS) IMPLANT
SEALER BIPOLAR AQUA 9.5 XL (INSTRUMENTS) IMPLANT
SET HNDPC FAN SPRY TIP SCT (DISPOSABLE) ×2 IMPLANT
SOLUTION PRONTOSAN WOUND 350ML (IRRIGATION / IRRIGATOR) ×2 IMPLANT
SPIKE FLUID TRANSFER (MISCELLANEOUS) ×2 IMPLANT
STAPLER VISISTAT (STAPLE) IMPLANT
STRIP CLOSURE SKIN 1/2X4 (GAUZE/BANDAGES/DRESSINGS) IMPLANT
SUT BONE WAX W31G (SUTURE) ×2 IMPLANT
SUT MNCRL AB 4-0 PS2 18 (SUTURE) IMPLANT
SUT STRATAFIX 0 PDS 27 VIOLET (SUTURE) ×1 IMPLANT
SUT VIC AB 1 CT1 27XBRD ANTBC (SUTURE) ×6 IMPLANT
SUT VIC AB 2-0 CT1 TAPERPNT 27 (SUTURE) ×6 IMPLANT
SUTURE STRATFX 0 PDS 27 VIOLET (SUTURE) ×2 IMPLANT
TRAY FOLEY MTR SLVR 16FR STAT (SET/KITS/TRAYS/PACK) ×2 IMPLANT
TUBE SUCTION HIGH CAP CLEAR NV (SUCTIONS) ×2 IMPLANT
WATER STERILE IRR 1000ML POUR (IV SOLUTION) ×2 IMPLANT
WIPE CHG 2% PREP (PERSONAL CARE ITEMS) ×2 IMPLANT
WRAP KNEE MAXI GEL POST OP (GAUZE/BANDAGES/DRESSINGS) ×2 IMPLANT

## 2023-10-28 NOTE — Interval H&P Note (Signed)
 History and Physical Interval Note:  10/28/2023 12:34 PM  Christine [name]  has presented today for surgery, with the diagnosis of Left knee degenerative joint disease.  The various methods of treatment have been discussed with the patient and family. After consideration of risks, benefits and other options for treatment, the patient has consented to  Procedure(s): ARTHROPLASTY, KNEE, TOTAL (Left) as a surgical intervention.  The patient's history has been reviewed, patient examined, no change in status, stable for surgery.  I have reviewed the patient's chart and labs.  Questions were answered to the patient's satisfaction.     Javier Docker

## 2023-10-28 NOTE — Op Note (Signed)
 NAME: Christine [name], Christine [name] MEDICAL RECORD NO: [id number] ACCOUNT NO: [id number] DATE OF BIRTH: 1970-09-06 FACILITY: [facility] LOCATION: [id number] PHYSICIAN: Javier Docker, MD  Operative Report   DATE OF PROCEDURE: 10/28/2023  PREOPERATIVE DIAGNOSIS:  End-stage osteoarthrosis, medial compartment, bone-on-bone varus deformity.  POSTOPERATIVE DIAGNOSIS: End-stage osteoarthrosis, medial compartment, bone-on-bone varus deformity, grade 4 changes in the patellofemoral joint.  PROCEDURE PERFORMED:  Left total knee arthroplasty utilizing Attune rotating platform, 5 femur, 4 tibia, 5 mm insert 32 patella.  ANESTHESIA:  Spinal.  ASSISTANT:  Lanna Poche, PA.  HISTORY:  The patient is a 53 year old with a history of knee arthroscopy, grade 4 changes of the medial compartment and patellofemoral joint, failing conservative treatments.  He was indicated for replacement of the degenerated joint.  Risks and  benefits discussed including bleeding, infection, damage to neurovascular structures, no change in symptoms, worsening symptoms of DVT, PE, anesthetic complications, etc.  DESCRIPTION OF PROCEDURE:  The patient was placed in the supine position.  After induction of spinal anesthesia and 3 g Kefzol, the left lower extremity was prepped and draped and exsanguinated in the usual sterile fashion.  Thigh tourniquet inflated to  225 mmHg.  A midline incision was then made over the knee.  Full-thickness flaps developed.  Median parapatellar arthrotomy was performed.  Elevated soft tissues medially preserving the MCL.  Patella was gently everted.  The knee was flexed.   Bone-on-bone arthrosis in the medial compartment of the patellofemoral joint was noted.  Remnants of the lateral meniscus and the medial meniscus were removed.  ACL was removed.  Osteophytes were removed with a rongeur.  A Leksell was utilized to fashion  above the femoral notch as a starting hole for the femoral drill.  This was drilled in line  with the femur to enter the femoral canal.  This was irrigated.  T-handle was placed inside the intramedullary portion of the femur.  Then an intramedullary  guide, 5-degree left 9 off the distal femur was pinned performed a distal femoral cut.  I then subluxed the tibia, which was somewhat difficult.  It was fairly tight.  Using external alignment guide, the low side was medially.  Throughout the defect,  external alignment guide, bisecting tibiotalar joint parallel to the shaft, 3-degree slope.  This was pinned.  I performed a proximal tibial cut.  The extension block was satisfactory with a 5-mm insert.  I then reflexed the knee, gently everted the  patella.  I then sized the femur off the anterior cortex to a 5.  Used our sizing guide 3 degrees of external rotation.  This was pinned.  Sized off the anterior cortex with the transepicondylar axis.  This was pinned.  Using our distal femoral cutting  block, I performed anterior, posterior, and chamfer cuts.  Soft tissues protected at all times.  I then subluxed the tibia again.  Measured it first to a 5.  There was overhang anteriorly and laterally, then sized to a 4, which was better sizing.  Just  the medial aspect of the tibial tubercle, medial third pin harvested bone centrally and impacted in the distal femur.  Drilled centrally used our punch guide.  Attention was turned back to the femur for a box cut guide, bisecting the femoral condyles.   Pinned to performed a box cut.  I then placed a trial femur and it fit flush.  5 mm insert reduced it, had full extension, full flexion, good stability to varus and valgus stress at 0 and 30 degrees.  Negative anterior drawer.  Everted patella measured  it to a 24, planned it to a 15 utilizing external patellar jig.  This was then measured at a 32 with a paddle parallel to the joint surface.  Drilled our peg holes, placed a trial patella, reduced it and had excellent patellofemoral tracking.  All  instrumentation  was then removed.  In flexion with a lamina spreader, we checked posteriorly.  Capsule was intact.  I injected Exparel through the posteromedial capsule after aspirating first without breaking the vacuum and injecting 10 mL.  I then  anesthetized the medial and lateral menisci, the periosteum of the femur, proximal lateral tibial plateau.  Quadriceps tendon.  Then pulsatile lavage thoroughly cleaned the joint.  Flexed it.  All surfaces thoroughly dried.  Mixed cement on the back  table under vacuum, centrifuged, placed cement in the proximal tibia, digitally pressurizing it.  I then cemented the tibial component and impacted into place.  Redundant cement was removed.  I cemented the femoral component with cement on the femur and  the component, packed it into place, fit flush, placed the 5 trial insert, reduced it into full extension, had an axial load applied throughout the curing of the cement.  Redundant cement was removed by cement and clamped the patella.  Marcaine with  epinephrine was placed in the posterior aspect of the wound and the wound was filled with Prontosan.  We used the Aquamantys throughout the case as the patient had recently been on anticoagulant.  Next, after the curing of the cement, the tourniquet was deflated at 70 minutes.  Any bleeding was cauterized.  I then meticulously removed all redundant cement.  I removed the 5 insert.  Again, meticulously removed all redundant cement.  I copiously  irrigated with pulsatile lavage and Prontosan.  I selected a 5 permanent insert, placed it, reduced it.  I had full extension, full flexion, good stability to varus and valgus stressing at 0 and 30 degrees.  Negative anterior drawer.  Next, in slight  flexion reapproximated the patellar arthrotomy with towel clips and then filled it with #1 Vicryl interrupted figure-of-eight sutures over sewn with a running Stratafix.  Following this, she had excellent patellofemoral tracking.  Subcutaneous was   copiously irrigated with Prontosan and lavaged and closed with 2-0 Monocryl.  Excellent patellofemoral tracking following this and flexion to gravity at 90 degrees.  Sterile dressing applied, placed in an immobilizer and transported to the recovery room  in satisfactory condition.  The patient tolerated the procedure well with no complications.  Assistant, Lanna Poche, PA was used throughout the case for patient positioning, traction, and closure.  BLOOD LOSS:  50 mL.   PUS D: 10/28/2023 3:13:55 pm T: 10/28/2023 9:25:00 pm  JOB: [id number]/ [id number]

## 2023-10-28 NOTE — Transfer of Care (Signed)
 Immediate Anesthesia Transfer of Care Note  Patient: Christine [name]  Procedure(s) Performed: ARTHROPLASTY, KNEE, TOTAL (Left: Knee)  Patient Location: PACU  Anesthesia Type:General  Level of Consciousness: awake and patient cooperative  Airway & Oxygen Therapy: Patient Spontanous Breathing and Patient connected to face mask  Post-op Assessment: Report given to RN and Post -op Vital signs reviewed and stable  Post vital signs: Reviewed and stable  Last Vitals:  Vitals Value Taken Time  BP 132/83 10/28/23 1528  Temp    Pulse 73 10/28/23 1529  Resp 11 10/28/23 1529  SpO2 99 % 10/28/23 1529  Vitals shown include unfiled device data.  Last Pain:  Vitals:   10/28/23 1230  TempSrc:   PainSc: 0-No pain         Complications: No notable events documented.

## 2023-10-28 NOTE — Anesthesia Procedure Notes (Signed)
 Anesthesia Regional Block: Adductor canal block   Pre-Anesthetic Checklist: , timeout performed,  Correct Patient, Correct Site, Correct Laterality,  Correct Procedure, Correct Position, site marked,  Risks and benefits discussed,  Surgical consent,  Pre-op evaluation,  At surgeon's request and post-op pain management  Laterality: Left  Prep: chloraprep       Needles:  Injection technique: Single-shot  Needle Type: Echogenic Needle     Needle Length: 9cm  Needle Gauge: 21     Additional Needles:   Procedures:,,,, ultrasound used (permanent image in chart),,    Narrative:  Start time: 10/28/2023 12:15 PM End time: 10/28/2023 12:23 PM Injection made incrementally with aspirations every 5 mL.  Performed by: Personally  Anesthesiologist: Collene Schlichter, MD  Additional Notes: No pain on injection. No increased resistance to injection. Injection made in 5cc increments.  Good needle visualization.  Patient tolerated procedure well.

## 2023-10-28 NOTE — Brief Op Note (Signed)
 10/28/2023  12:34 PM  PATIENT:  Christine [name]  53 y.o. female  PRE-OPERATIVE DIAGNOSIS:  Left knee degenerative joint disease  POST-OPERATIVE DIAGNOSIS:  * No post-op diagnosis entered *  PROCEDURE:  Procedure(s): ARTHROPLASTY, KNEE, TOTAL (Left) The aquamantis was utilized for this case to help facilitate better hemostasis as patient was felt to be at increased risk of bleeding because of recent anticoagulation use.   SURGEON:  Surgeons and Role:    * Jene Every, MD - Primary  PHYSICIAN ASSISTANT:   ASSISTANTS: Bissell   ANESTHESIA:   spinal  EBL:  50   BLOOD ADMINISTERED:none  DRAINS: none   LOCAL MEDICATIONS USED:  MARCAINE     SPECIMEN:  No Specimen  DISPOSITION OF SPECIMEN:  N/A  COUNTS:  YES  TOURNIQUET:  70 min  DICTATION: .Other Dictation: Dictation Number [id number]  PLAN OF CARE: Discharge to home after PACU  PATIENT DISPOSITION:  PACU - hemodynamically stable.   Delay start of Pharmacological VTE agent (>24hrs) due to surgical blood loss or risk of bleeding: no

## 2023-10-28 NOTE — Anesthesia Preprocedure Evaluation (Signed)
 Anesthesia Evaluation  Patient identified by MRN, date of birth, ID band Patient awake    Reviewed: Allergy & Precautions, NPO status , Patient's Chart, lab work & pertinent test results  History of Anesthesia Complications (+) PONV and history of anesthetic complications  Airway Mallampati: II  TM Distance: >3 FB Neck ROM: Full    Dental  (+) Teeth Intact, Dental Advisory Given   Pulmonary asthma , former smoker   Pulmonary exam normal breath sounds clear to auscultation       Cardiovascular hypertension, Pt. on medications + CAD  Normal cardiovascular exam+ Valvular Problems/Murmurs (TR) AI  Rhythm:Regular Rate:Normal     Neuro/Psych  Headaches PSYCHIATRIC DISORDERS Anxiety Depression       GI/Hepatic Neg liver ROS,GERD  Medicated,,  Endo/Other  Hypothyroidism  Obesity   Renal/GU negative Renal ROS     Musculoskeletal  (+) Arthritis ,  Fibromyalgia -Left knee degenerative joint disease   Abdominal   Peds  Hematology negative hematology ROS (+) Plt 295k   Anesthesia Other Findings Day of surgery medications reviewed with the patient.  Reproductive/Obstetrics                             Anesthesia Physical Anesthesia Plan  ASA: 2  Anesthesia Plan: Spinal   Post-op Pain Management: Tylenol PO (pre-op)* and Regional block*   Induction: Intravenous  PONV Risk Score and Plan: 3 and TIVA, Midazolam, Dexamethasone and Ondansetron  Airway Management Planned: Natural Airway and Simple Face Mask  Additional Equipment:   Intra-op Plan:   Post-operative Plan:   Informed Consent: I have reviewed the patients History and Physical, chart, labs and discussed the procedure including the risks, benefits and alternatives for the proposed anesthesia with the patient or authorized representative who has indicated his/her understanding and acceptance.     Dental advisory given  Plan  Discussed with: CRNA, Anesthesiologist and Surgeon  Anesthesia Plan Comments: (Discussed risks and benefits of and differences between spinal and general. Discussed risks of spinal including headache, backache, failure, bleeding, infection, and nerve damage. Patient consents to spinal. Questions answered. Coagulation studies and platelet count acceptable.)       Anesthesia Quick Evaluation

## 2023-10-28 NOTE — Plan of Care (Signed)
  Problem: Nutrition: Goal: Adequate nutrition will be maintained Outcome: Progressing   Problem: Coping: Goal: Level of anxiety will decrease 10/28/2023 1917 by Sharla Kidney, RN Outcome: Progressing 10/28/2023 1916 by Sharla Kidney, RN Outcome: Progressing   Problem: Elimination: Goal: Will not experience complications related to urinary retention Outcome: Progressing   Problem: Pain Managment: Goal: General experience of comfort will improve and/or be controlled 10/28/2023 1917 by Sharla Kidney, RN Outcome: Progressing 10/28/2023 1916 by Sharla Kidney, RN Outcome: Progressing   Problem: Safety: Goal: Ability to remain free from injury will improve 10/28/2023 1917 by Sharla Kidney, RN Outcome: Progressing 10/28/2023 1916 by Sharla Kidney, RN Outcome: Progressing   Problem: Skin Integrity: Goal: Risk for impaired skin integrity will decrease 10/28/2023 1917 by Sharla Kidney, RN Outcome: Progressing 10/28/2023 1916 by Sharla Kidney, RN Outcome: Progressing

## 2023-10-28 NOTE — Discharge Instructions (Signed)
 Elevate leg above heart 6x a day for each Use knee immobilizer while walking until can SLR x 10 Use knee immobilizer in bed to keep knee in extension Aquacel dressing may remain in place for one week. May shower with aquacel dressing in place. If the dressing becomes saturated or peels off, you may remove aquacel dressing. Do not remove steri-strips if they are present. Place new dressing with gauze and tape or ACE bandage which should be kept clean and dry and changed daily.  INSTRUCTIONS AFTER JOINT REPLACEMENT   Remove items at home which could result in a fall. This includes throw rugs or furniture in walking pathways ICE to the affected joint every three hours while awake for 30 minutes at a time, for at least the first 3-5 days, and then as needed for pain and swelling.  Continue to use ice for pain and swelling. You may notice swelling that will progress down to the foot and ankle.  This is normal after surgery.  Elevate your leg when you are not up walking on it.   Continue to use the breathing machine you got in the hospital (incentive spirometer) which will help keep your temperature down.  It is common for your temperature to cycle up and down following surgery, especially at night when you are not up moving around and exerting yourself.  The breathing machine keeps your lungs expanded and your temperature down.   DIET:  As you were doing prior to hospitalization, we recommend a well-balanced diet.  DRESSING / WOUND CARE / SHOWERING   You may change your dressing 7 days after surgery.  Then change the dressing every day with sterile gauze.  Please use good hand washing techniques before changing the dressing.  Do not use any lotions or creams on the incision until instructed by your surgeon.  ACTIVITY  Increase activity slowly as tolerated, but follow the weight bearing instructions below.   No driving for 6 weeks or until further direction given by your physician.  You  cannot drive while taking narcotics.  No lifting or carrying greater than 10 lbs. until further directed by your surgeon. Avoid periods of inactivity such as sitting longer than an hour when not asleep. This helps prevent blood clots.  You may return to work once you are authorized by your doctor.     WEIGHT BEARING   Weight bearing as tolerated with assist device (walker, cane, etc) as directed, use it as long as suggested by your surgeon or therapist, typically at least 4-6 weeks.   EXERCISES  Results after joint replacement surgery are often greatly improved when you follow the exercise, range of motion and muscle strengthening exercises prescribed by your doctor. Safety measures are also important to protect the joint from further injury. Any time any of these exercises cause you to have increased pain or swelling, decrease what you are doing until you are comfortable again and then slowly increase them. If you have problems or questions, call your caregiver or physical therapist for advice.   Rehabilitation is important following a joint replacement. After just a few days of immobilization, the muscles of the leg can become weakened and shrink (atrophy).  These exercises are designed to build up the tone and strength of the thigh and leg muscles and to improve motion. Often times heat used for twenty to thirty minutes before working out will loosen up your tissues and help with improving the range of motion but do not use heat  for the first two weeks following surgery (sometimes heat can increase post-operative swelling).   These exercises can be done on a training (exercise) mat, on the floor, on a table or on a bed. Use whatever works the best and is most comfortable for you.    Use music or television while you are exercising so that the exercises are a pleasant break in your day. This will make your life better with the exercises acting as a break in your routine that you can look forward  to.   Perform all exercises about fifteen times, three times per day or as directed.  You should exercise both the operative leg and the other leg as well.  Exercises include:   Quad Sets - Tighten up the muscle on the front of the thigh (Quad) and hold for 5-10 seconds.   Straight Leg Raises - With your knee straight (if you were given a brace, keep it on), lift the leg to 60 degrees, hold for 3 seconds, and slowly lower the leg.  Perform this exercise against resistance later as your leg gets stronger.  Leg Slides: Lying on your back, slowly slide your foot toward your buttocks, bending your knee up off the floor (only go as far as is comfortable). Then slowly slide your foot back down until your leg is flat on the floor again.  Angel Wings: Lying on your back spread your legs to the side as far apart as you can without causing discomfort.  Hamstring Strength:  Lying on your back, push your heel against the floor with your leg straight by tightening up the muscles of your buttocks.  Repeat, but this time bend your knee to a comfortable angle, and push your heel against the floor.  You may put a pillow under the heel to make it more comfortable if necessary.   A rehabilitation program following joint replacement surgery can speed recovery and prevent re-injury in the future due to weakened muscles. Contact your doctor or a physical therapist for more information on knee rehabilitation.    CONSTIPATION  Constipation is defined medically as fewer than three stools per week and severe constipation as less than one stool per week.  Even if you have a regular bowel pattern at home, your normal regimen is likely to be disrupted due to multiple reasons following surgery.  Combination of anesthesia, postoperative narcotics, change in appetite and fluid intake all can affect your bowels.   YOU MUST use at least one of the following options; they are listed in order of increasing strength to get the job  done.  They are all available over the counter, and you may need to use some, POSSIBLY even all of these options:    Drink plenty of fluids (prune juice may be helpful) and high fiber foods Colace 100 mg by mouth twice a day  Senokot for constipation as directed and as needed Dulcolax (bisacodyl), take with full glass of water  Miralax (polyethylene glycol) once or twice a day as needed.  If you have tried all these things and are unable to have a bowel movement in the first 3-4 days after surgery call either your surgeon or your primary doctor.    If you experience loose stools or diarrhea, hold the medications until you stool forms back up.  If your symptoms do not get better within 1 week or if they get worse, check with your doctor.  If you experience "the worst abdominal pain ever" or develop  nausea or vomiting, please contact the office immediately for further recommendations for treatment.   ITCHING:  If you experience itching with your medications, try taking only a single pain pill, or even half a pain pill at a time.  You can also use Benadryl over the counter for itching or also to help with sleep.   TED HOSE STOCKINGS:  Use stockings on both legs until for at least 2 weeks or as directed by physician office. They may be removed at night for sleeping.  MEDICATIONS:  See your medication summary on the "After Visit Summary" that nursing will review with you.  You may have some home medications which will be placed on hold until you complete the course of blood thinner medication.  It is important for you to complete the blood thinner medication as prescribed.  PRECAUTIONS:  If you experience chest pain or shortness of breath - call 911 immediately for transfer to the hospital emergency department.   If you develop a fever greater that 101 F, purulent drainage from wound, increased redness or drainage from wound, foul odor from the wound/dressing, or calf pain - CONTACT YOUR SURGEON.                                                    FOLLOW-UP APPOINTMENTS:  If you do not already have a post-op appointment, please call the office for an appointment to be seen by your surgeon.  Guidelines for how soon to be seen are listed in your "After Visit Summary", but are typically between 1-4 weeks after surgery.  OTHER INSTRUCTIONS:   Knee Replacement:  Do not place pillow under knee, focus on keeping the knee straight while resting. CPM instructions: 0-90 degrees, 2 hours in the morning, 2 hours in the afternoon, and 2 hours in the evening. Place foam block, curve side up under heel at all times except when in CPM or when walking.  DO NOT modify, tear, cut, or change the foam block in any way.  POST-OPERATIVE OPIOID TAPER INSTRUCTIONS: It is important to wean off of your opioid medication as soon as possible. If you do not need pain medication after your surgery it is ok to stop day one. Opioids include: Codeine, Hydrocodone(Norco, Vicodin), Oxycodone(Percocet, oxycontin) and hydromorphone amongst others.  Long term and even short term use of opiods can cause: Increased pain response Dependence Constipation Depression Respiratory depression And more.  Withdrawal symptoms can include Flu like symptoms Nausea, vomiting And more Techniques to manage these symptoms Hydrate well Eat regular healthy meals Stay active Use relaxation techniques(deep breathing, meditating, yoga) Do Not substitute Alcohol to help with tapering If you have been on opioids for less than two weeks and do not have pain than it is ok to stop all together.  Plan to wean off of opioids This plan should start within one week post op of your joint replacement. Maintain the same interval or time between taking each dose and first decrease the dose.  Cut the total daily intake of opioids by one tablet each day Next start to increase the time between doses. The last dose that should be eliminated is the evening  dose.   MAKE SURE YOU:  Understand these instructions.  Get help right away if you are not doing well or get worse.    Thank you for  letting us be a part of your medical care team.  It is a privilege we respect greatly.  We hope these instructions will help you stay on track for a fast and full recovery!

## 2023-10-28 NOTE — Anesthesia Procedure Notes (Signed)
 Spinal  Patient location during procedure: OR Start time: 10/28/2023 12:51 PM End time: 10/28/2023 12:56 PM Reason for block: surgical anesthesia Staffing Performed: resident/CRNA  Resident/CRNA: Garth Bigness, CRNA Performed by: Garth Bigness, CRNA Authorized by: Collene Schlichter, MD   Preanesthetic Checklist Completed: patient identified, IV checked, site marked, risks and benefits discussed, surgical consent, monitors and equipment checked, pre-op evaluation and timeout performed Spinal Block Patient position: sitting Prep: DuraPrep Patient monitoring: heart rate, continuous pulse ox and blood pressure Approach: midline Location: L4-5 Injection technique: single-shot Needle Needle type: Pencan  Needle gauge: 24 G Needle length: 9 cm Needle insertion depth: 5 cm Assessment Sensory level: T6 Events: CSF return Additional Notes

## 2023-10-28 NOTE — Plan of Care (Signed)

## 2023-10-29 DIAGNOSIS — Z79899 Other long term (current) drug therapy: Secondary | ICD-10-CM | POA: Diagnosis not present

## 2023-10-29 DIAGNOSIS — Z6834 Body mass index (BMI) 34.0-34.9, adult: Secondary | ICD-10-CM | POA: Diagnosis not present

## 2023-10-29 DIAGNOSIS — K219 Gastro-esophageal reflux disease without esophagitis: Secondary | ICD-10-CM | POA: Diagnosis not present

## 2023-10-29 DIAGNOSIS — I2510 Atherosclerotic heart disease of native coronary artery without angina pectoris: Secondary | ICD-10-CM | POA: Diagnosis not present

## 2023-10-29 DIAGNOSIS — M21162 Varus deformity, not elsewhere classified, left knee: Secondary | ICD-10-CM | POA: Diagnosis not present

## 2023-10-29 DIAGNOSIS — I10 Essential (primary) hypertension: Secondary | ICD-10-CM | POA: Diagnosis not present

## 2023-10-29 DIAGNOSIS — E669 Obesity, unspecified: Secondary | ICD-10-CM | POA: Diagnosis not present

## 2023-10-29 DIAGNOSIS — M1712 Unilateral primary osteoarthritis, left knee: Secondary | ICD-10-CM | POA: Diagnosis not present

## 2023-10-29 DIAGNOSIS — Z87891 Personal history of nicotine dependence: Secondary | ICD-10-CM | POA: Diagnosis not present

## 2023-10-29 LAB — BASIC METABOLIC PANEL WITH GFR
Anion gap: 9 (ref 5–15)
BUN: 12 mg/dL (ref 6–20)
CO2: 24 mmol/L (ref 22–32)
Calcium: 9.1 mg/dL (ref 8.9–10.3)
Chloride: 103 mmol/L (ref 98–111)
Creatinine, Ser: 0.63 mg/dL (ref 0.44–1.00)
GFR, Estimated: >60 mL/min (ref >60)
Glucose, Bld: 141 mg/dL — ABNORMAL HIGH (ref 70–99)
Potassium: 4.4 mmol/L (ref 3.5–5.1)
Sodium: 136 mmol/L (ref 135–145)

## 2023-10-29 LAB — CBC
HCT: 36.5 % (ref 36.0–46.0)
Hemoglobin: 11.6 g/dL — ABNORMAL LOW (ref 12.0–15.0)
MCH: 29.1 pg (ref 26.0–34.0)
MCHC: 31.8 g/dL (ref 30.0–36.0)
MCV: 91.5 fL (ref 80.0–100.0)
Platelets: 294 10*3/uL (ref 150–400)
RBC: 3.99 MIL/uL (ref 3.87–5.11)
RDW: 13.1 % (ref 11.5–15.5)
WBC: 12.5 10*3/uL — ABNORMAL HIGH (ref 4.0–10.5)
nRBC: 0 % (ref 0.0–0.2)

## 2023-10-29 NOTE — Plan of Care (Signed)
  Problem: Activity: Goal: Risk for activity intolerance will decrease Outcome: Progressing   Problem: Nutrition: Goal: Adequate nutrition will be maintained Outcome: Progressing   Problem: Coping: Goal: Level of anxiety will decrease Outcome: Progressing   Problem: Elimination: Goal: Will not experience complications related to urinary retention Outcome: Progressing   Problem: Pain Managment: Goal: General experience of comfort will improve and/or be controlled Outcome: Progressing   Problem: Safety: Goal: Ability to remain free from injury will improve Outcome: Progressing   Problem: Skin Integrity: Goal: Risk for impaired skin integrity will decrease Outcome: Progressing   Problem: Pain Management: Goal: Pain level will decrease with appropriate interventions Outcome: Progressing

## 2023-10-29 NOTE — Evaluation (Signed)
 Physical Therapy Evaluation Patient Details Name: Christine [name] MRN: [id number] DOB: [birth year] Today's Date: 10/29/2023  History of Present Illness  Pt is a 53 year old female s/p L TKA on 10/28/23.  Clinical Impression  Pt is s/p TKA resulting in the deficits listed below (see PT Problem List).  Pt will benefit from acute skilled PT to increase their independence and safety with mobility to allow discharge.  Pt ambulated in hallway and performed LE exercises.  Pt anticipates d/c home later today.  Pt has a bedroom on second floor but can stay on main level if necessary.  Pt plans to have HHPT upon d/c.  Pt reports she will need RW upon d/c and had some questions about BSC since she was having difficulty on/off toilet prior to surgery.         If plan is discharge home, recommend the following: A little help with walking and/or transfers;Help with stairs or ramp for entrance;Assistance with cooking/housework   Can travel by private vehicle        Equipment Recommendations Rolling walker (2 wheels);BSC/3in1  Recommendations for Other Services       Functional Status Assessment Patient has had a recent decline in their functional status and demonstrates the ability to make significant improvements in function in a reasonable and predictable amount of time.     Precautions / Restrictions Precautions Precautions: Knee;Fall Recall of Precautions/Restrictions: Intact Restrictions Weight Bearing Restrictions Per Provider Order: No Other Position/Activity Restrictions: WBAT      Mobility  Bed Mobility Overal bed mobility: Needs Assistance Bed Mobility: Supine to Sit     Supine to sit: Supervision, HOB elevated          Transfers Overall transfer level: Needs assistance Equipment used: Rolling walker (2 wheels) Transfers: Sit to/from Stand Sit to Stand: Contact guard assist           General transfer comment: verbal cues for UE and LE positioning for pain  control    Ambulation/Gait Ambulation/Gait assistance: Contact guard assist Gait Distance (Feet): 100 Feet Assistive device: Rolling walker (2 wheels) Gait Pattern/deviations: Step-to pattern, Decreased stance time - left, Antalgic       General Gait Details: verbal cues for sequence, step length, RW positioning  Stairs            Wheelchair Mobility     Tilt Bed    Modified Rankin (Stroke Patients Only)       Balance                                             Pertinent Vitals/Pain Pain Assessment Pain Assessment: 0-10 Pain Score: 4  Pain Location: left knee Pain Descriptors / Indicators: Sore, Aching Pain Intervention(s): Monitored during session, Repositioned, Ice applied, Premedicated before session    Home Living Family/patient expects to be discharged to:: Private residence Living Arrangements: Spouse/significant other   Type of Home: House       Alternate Level Stairs-Number of Steps: 14 Home Layout: Two level;Able to live on main level with bedroom/bathroom Home Equipment: None      Prior Function Prior Level of Function : Independent/Modified Independent                     Extremity/Trunk Assessment        Lower Extremity Assessment Lower Extremity Assessment: LLE deficits/detail LLE Deficits /  Details: left knee AAROM 5-60*, able to perform ankle pumps       Communication   Communication Communication: No apparent difficulties    Cognition Arousal: Alert Behavior During Therapy: WFL for tasks assessed/performed   PT - Cognitive impairments: No apparent impairments                         Following commands: Intact       Cueing       General Comments      Exercises Total Joint Exercises Ankle Circles/Pumps: AROM, Both, 10 reps Quad Sets: AROM, Both, 10 reps Heel Slides: AROM, Left, 10 reps, Seated Hip ABduction/ADduction: AAROM, Left, 10 reps Straight Leg Raises: AAROM, Left,  10 reps   Assessment/Plan    PT Assessment Patient needs continued PT services  PT Problem List Decreased strength;Decreased range of motion;Decreased mobility;Decreased knowledge of use of DME;Pain;Decreased knowledge of precautions;Decreased activity tolerance       PT Treatment Interventions Stair training;Gait training;DME instruction;Balance training;Functional mobility training;Patient/family education;Therapeutic activities;Therapeutic exercise    PT Goals (Current goals can be found in the Care Plan section)  Acute Rehab PT Goals PT Goal Formulation: With patient/family Time For Goal Achievement: 11/12/23 Potential to Achieve Goals: Good    Frequency 7X/week     Co-evaluation               AM-PAC PT "6 Clicks" Mobility  Outcome Measure Help needed turning from your back to your side while in a flat bed without using bedrails?: A Little Help needed moving from lying on your back to sitting on the side of a flat bed without using bedrails?: A Little Help needed moving to and from a bed to a chair (including a wheelchair)?: A Little Help needed standing up from a chair using your arms (e.g., wheelchair or bedside chair)?: A Little Help needed to walk in hospital room?: A Little Help needed climbing 3-5 steps with a railing? : A Little 6 Click Score: 18    End of Session Equipment Utilized During Treatment: Gait belt Activity Tolerance: Patient tolerated treatment well Patient left: in chair;with call bell/phone within reach;with family/visitor present   PT Visit Diagnosis: Other abnormalities of gait and mobility (R26.89)    Time: 1610-9604 PT Time Calculation (min) (ACUTE ONLY): 21 min   Charges:   PT Evaluation $PT Eval Low Complexity: 1 Low   PT General Charges $$ ACUTE PT VISIT: 1 Visit    Paulino Door, DPT Physical Therapist Acute Rehabilitation Services Office: [phone]   [name] 10/29/2023, 10:56 AM

## 2023-10-29 NOTE — Progress Notes (Signed)
 Subjective: 1 Day Post-Op Procedure(s) (LRB): ARTHROPLASTY, KNEE, TOTAL (Left)  Patient reports pain as appropriately controlled. Denies any new numbness/tingling.   Objective:   VITALS:  Temp:  [97.7 F (36.5 C)-98.3 F (36.8 C)] 97.8 F (36.6 C) (03/29 0642) Pulse Rate:  [65-108] 80 (03/29 0642) Resp:  [10-20] 16 (03/29 0642) BP: (121-161)/(67-91) 127/82 (03/29 0642) SpO2:  [90 %-100 %] 98 % (03/29 0642) Weight:  [103.4 kg] 103.4 kg (03/28 1000)  Gen: AAOx3, NAD Comfortable at rest  Left Lower Extremity: Dressings intact ADF/APF/EHL 5/5 SILT throughout DP, PT 2+ to palp CR < 2s    LABS Recent Labs    10/29/23 0334  HGB 11.6*  WBC 12.5*  PLT 294   Recent Labs    10/29/23 0334  NA 136  K 4.4  CL 103  CO2 24  BUN 12  CREATININE 0.63  GLUCOSE 141*   No results for input(s): "LABPT", "INR" in the last 72 hours.   Assessment/Plan: 1 Day Post-Op Procedure(s) (LRB): ARTHROPLASTY, KNEE, TOTAL (Left)  Advance diet Up with therapy D/C IV fluids Discharge home with home health  Netta Cedars 10/29/2023, 9:27 AM

## 2023-10-29 NOTE — Progress Notes (Signed)
 Discharge paperwork printed and reviewed with the pt and husband. Pt verbalized understanding and no concerns made. Walker delivered to the room to the pt. Husband will be providing transportation.

## 2023-10-29 NOTE — Progress Notes (Signed)
 Physical Therapy Treatment Patient Details Name: Christine [name] MRN: [id number] DOB: [birth year] Today's Date: 10/29/2023   History of Present Illness Pt is a 53 year old female s/p L TKA on 10/28/23.    PT Comments  Pt ambulated good distance and practiced safe stair technique with spouse holding gait belt.  Pt provided with HEP handout and plans to have HHPT starting tomorrow.  Pt feels ready for d/c home today.  Pt and spouse had no further questions.     If plan is discharge home, recommend the following: Help with stairs or ramp for entrance;Assistance with cooking/housework   Can travel by private vehicle        Equipment Recommendations  Rolling walker (2 wheels);BSC/3in1    Recommendations for Other Services       Precautions / Restrictions Precautions Precautions: Knee;Fall Recall of Precautions/Restrictions: Intact Restrictions Other Position/Activity Restrictions: WBAT     Mobility  Bed Mobility Overal bed mobility: Needs Assistance Bed Mobility: Supine to Sit     Supine to sit: Supervision, HOB elevated     General bed mobility comments: pt in recliner    Transfers Overall transfer level: Needs assistance Equipment used: Rolling walker (2 wheels) Transfers: Sit to/from Stand Sit to Stand: Supervision           General transfer comment: verbal cues for UE and LE positioning for pain control    Ambulation/Gait Ambulation/Gait assistance: Contact guard assist Gait Distance (Feet): 240 Feet Assistive device: Rolling walker (2 wheels) Gait Pattern/deviations: Step-to pattern, Decreased stance time - left, Antalgic       General Gait Details: verbal cues for sequence, step length, RW positioning; spouse held gait belt (ambulated to stairs and returned to room in recliner)   Stairs Stairs: Yes Stairs assistance: Contact guard assist Stair Management: Step to pattern, Forwards, One rail Right, With crutches Number of Stairs: 4 General stair  comments: verbal cues for sequence and safety; spouse assisted holding gait belt; pt has crutch at home   Wheelchair Mobility     Tilt Bed    Modified Rankin (Stroke Patients Only)       Balance                                            Communication Communication Communication: No apparent difficulties  Cognition Arousal: Alert Behavior During Therapy: WFL for tasks assessed/performed   PT - Cognitive impairments: No apparent impairments                         Following commands: Intact      Cueing    Exercises     General Comments        Pertinent Vitals/Pain Pain Assessment Pain Assessment: 0-10 Pain Score: 6  Pain Location: left knee Pain Descriptors / Indicators: Sore, Aching Pain Intervention(s): Monitored during session, Repositioned, Premedicated before session (reports pain meds just before therapist arrival, ice in room end of session)    Home Living                          Prior Function            PT Goals (current goals can now be found in the care plan section) Progress towards PT goals: Progressing toward goals    Frequency  7X/week      PT Plan      Co-evaluation              AM-PAC PT "6 Clicks" Mobility   Outcome Measure  Help needed turning from your back to your side while in a flat bed without using bedrails?: A Little Help needed moving from lying on your back to sitting on the side of a flat bed without using bedrails?: A Little Help needed moving to and from a bed to a chair (including a wheelchair)?: A Little Help needed standing up from a chair using your arms (e.g., wheelchair or bedside chair)?: A Little Help needed to walk in hospital room?: A Little Help needed climbing 3-5 steps with a railing? : A Little 6 Click Score: 18    End of Session Equipment Utilized During Treatment: Gait belt Activity Tolerance: Patient tolerated treatment well Patient left: in  chair;with call bell/phone within reach;with family/visitor present Nurse Communication: Mobility status PT Visit Diagnosis: Other abnormalities of gait and mobility (R26.89)     Time: 2130-8657 PT Time Calculation (min) (ACUTE ONLY): 18 min  Charges:    $Gait Training: 8-22 mins PT General Charges $$ ACUTE PT VISIT: 1 Visit                    Paulino Door, DPT Physical Therapist Acute Rehabilitation Services Office: [phone]   [name] 10/29/2023, 3:13 PM

## 2023-10-30 DIAGNOSIS — G43909 Migraine, unspecified, not intractable, without status migrainosus: Secondary | ICD-10-CM | POA: Diagnosis not present

## 2023-10-30 DIAGNOSIS — D649 Anemia, unspecified: Secondary | ICD-10-CM | POA: Diagnosis not present

## 2023-10-30 DIAGNOSIS — G5603 Carpal tunnel syndrome, bilateral upper limbs: Secondary | ICD-10-CM | POA: Diagnosis not present

## 2023-10-30 DIAGNOSIS — Z471 Aftercare following joint replacement surgery: Secondary | ICD-10-CM | POA: Diagnosis not present

## 2023-10-30 DIAGNOSIS — I2510 Atherosclerotic heart disease of native coronary artery without angina pectoris: Secondary | ICD-10-CM | POA: Diagnosis not present

## 2023-10-30 DIAGNOSIS — K5900 Constipation, unspecified: Secondary | ICD-10-CM | POA: Diagnosis not present

## 2023-10-30 DIAGNOSIS — Z7982 Long term (current) use of aspirin: Secondary | ICD-10-CM | POA: Diagnosis not present

## 2023-10-30 DIAGNOSIS — K219 Gastro-esophageal reflux disease without esophagitis: Secondary | ICD-10-CM | POA: Diagnosis not present

## 2023-10-30 DIAGNOSIS — I082 Rheumatic disorders of both aortic and tricuspid valves: Secondary | ICD-10-CM | POA: Diagnosis not present

## 2023-10-30 DIAGNOSIS — F419 Anxiety disorder, unspecified: Secondary | ICD-10-CM | POA: Diagnosis not present

## 2023-10-30 DIAGNOSIS — Z96652 Presence of left artificial knee joint: Secondary | ICD-10-CM | POA: Diagnosis not present

## 2023-10-30 DIAGNOSIS — E039 Hypothyroidism, unspecified: Secondary | ICD-10-CM | POA: Diagnosis not present

## 2023-10-30 DIAGNOSIS — M797 Fibromyalgia: Secondary | ICD-10-CM | POA: Diagnosis not present

## 2023-10-30 DIAGNOSIS — Z7951 Long term (current) use of inhaled steroids: Secondary | ICD-10-CM | POA: Diagnosis not present

## 2023-10-30 DIAGNOSIS — F32A Depression, unspecified: Secondary | ICD-10-CM | POA: Diagnosis not present

## 2023-10-30 DIAGNOSIS — J45909 Unspecified asthma, uncomplicated: Secondary | ICD-10-CM | POA: Diagnosis not present

## 2023-10-31 ENCOUNTER — Encounter (HOSPITAL_COMMUNITY): Payer: Self-pay | Admitting: Specialist

## 2023-10-31 NOTE — Anesthesia Postprocedure Evaluation (Signed)
 Anesthesia Post Note  Patient: Christine [name]  Procedure(s) Performed: ARTHROPLASTY, KNEE, TOTAL (Left: Knee)     Patient location during evaluation: PACU Anesthesia Type: Spinal Level of consciousness: awake, awake and alert and oriented Pain management: pain level controlled Vital Signs Assessment: post-procedure vital signs reviewed and stable Respiratory status: spontaneous breathing, nonlabored ventilation and respiratory function stable Cardiovascular status: blood pressure returned to baseline and stable Postop Assessment: no headache, no backache, spinal receding and no apparent nausea or vomiting Anesthetic complications: no   No notable events documented.  Last Vitals:    Last Pain:                 Collene Schlichter

## 2023-11-02 DIAGNOSIS — D649 Anemia, unspecified: Secondary | ICD-10-CM | POA: Diagnosis not present

## 2023-11-02 DIAGNOSIS — K5900 Constipation, unspecified: Secondary | ICD-10-CM | POA: Diagnosis not present

## 2023-11-02 DIAGNOSIS — Z471 Aftercare following joint replacement surgery: Secondary | ICD-10-CM | POA: Diagnosis not present

## 2023-11-02 DIAGNOSIS — I2510 Atherosclerotic heart disease of native coronary artery without angina pectoris: Secondary | ICD-10-CM | POA: Diagnosis not present

## 2023-11-02 DIAGNOSIS — F419 Anxiety disorder, unspecified: Secondary | ICD-10-CM | POA: Diagnosis not present

## 2023-11-02 DIAGNOSIS — Z7951 Long term (current) use of inhaled steroids: Secondary | ICD-10-CM | POA: Diagnosis not present

## 2023-11-02 DIAGNOSIS — G5603 Carpal tunnel syndrome, bilateral upper limbs: Secondary | ICD-10-CM | POA: Diagnosis not present

## 2023-11-02 DIAGNOSIS — K219 Gastro-esophageal reflux disease without esophagitis: Secondary | ICD-10-CM | POA: Diagnosis not present

## 2023-11-02 DIAGNOSIS — G43909 Migraine, unspecified, not intractable, without status migrainosus: Secondary | ICD-10-CM | POA: Diagnosis not present

## 2023-11-02 DIAGNOSIS — F32A Depression, unspecified: Secondary | ICD-10-CM | POA: Diagnosis not present

## 2023-11-02 DIAGNOSIS — E039 Hypothyroidism, unspecified: Secondary | ICD-10-CM | POA: Diagnosis not present

## 2023-11-02 DIAGNOSIS — M797 Fibromyalgia: Secondary | ICD-10-CM | POA: Diagnosis not present

## 2023-11-02 DIAGNOSIS — I082 Rheumatic disorders of both aortic and tricuspid valves: Secondary | ICD-10-CM | POA: Diagnosis not present

## 2023-11-02 DIAGNOSIS — Z7982 Long term (current) use of aspirin: Secondary | ICD-10-CM | POA: Diagnosis not present

## 2023-11-02 DIAGNOSIS — J45909 Unspecified asthma, uncomplicated: Secondary | ICD-10-CM | POA: Diagnosis not present

## 2023-11-02 DIAGNOSIS — Z96652 Presence of left artificial knee joint: Secondary | ICD-10-CM | POA: Diagnosis not present

## 2023-11-03 DIAGNOSIS — J45909 Unspecified asthma, uncomplicated: Secondary | ICD-10-CM | POA: Diagnosis not present

## 2023-11-03 DIAGNOSIS — I082 Rheumatic disorders of both aortic and tricuspid valves: Secondary | ICD-10-CM | POA: Diagnosis not present

## 2023-11-03 DIAGNOSIS — Z471 Aftercare following joint replacement surgery: Secondary | ICD-10-CM | POA: Diagnosis not present

## 2023-11-03 DIAGNOSIS — E039 Hypothyroidism, unspecified: Secondary | ICD-10-CM | POA: Diagnosis not present

## 2023-11-03 DIAGNOSIS — K219 Gastro-esophageal reflux disease without esophagitis: Secondary | ICD-10-CM | POA: Diagnosis not present

## 2023-11-03 DIAGNOSIS — Z96652 Presence of left artificial knee joint: Secondary | ICD-10-CM | POA: Diagnosis not present

## 2023-11-03 DIAGNOSIS — G5603 Carpal tunnel syndrome, bilateral upper limbs: Secondary | ICD-10-CM | POA: Diagnosis not present

## 2023-11-03 DIAGNOSIS — Z7951 Long term (current) use of inhaled steroids: Secondary | ICD-10-CM | POA: Diagnosis not present

## 2023-11-03 DIAGNOSIS — I2510 Atherosclerotic heart disease of native coronary artery without angina pectoris: Secondary | ICD-10-CM | POA: Diagnosis not present

## 2023-11-03 DIAGNOSIS — K5900 Constipation, unspecified: Secondary | ICD-10-CM | POA: Diagnosis not present

## 2023-11-03 DIAGNOSIS — Z7982 Long term (current) use of aspirin: Secondary | ICD-10-CM | POA: Diagnosis not present

## 2023-11-03 DIAGNOSIS — D649 Anemia, unspecified: Secondary | ICD-10-CM | POA: Diagnosis not present

## 2023-11-03 DIAGNOSIS — G43909 Migraine, unspecified, not intractable, without status migrainosus: Secondary | ICD-10-CM | POA: Diagnosis not present

## 2023-11-03 DIAGNOSIS — M797 Fibromyalgia: Secondary | ICD-10-CM | POA: Diagnosis not present

## 2023-11-03 DIAGNOSIS — F32A Depression, unspecified: Secondary | ICD-10-CM | POA: Diagnosis not present

## 2023-11-03 DIAGNOSIS — F419 Anxiety disorder, unspecified: Secondary | ICD-10-CM | POA: Diagnosis not present

## 2023-11-05 DIAGNOSIS — J45909 Unspecified asthma, uncomplicated: Secondary | ICD-10-CM | POA: Diagnosis not present

## 2023-11-05 DIAGNOSIS — G43909 Migraine, unspecified, not intractable, without status migrainosus: Secondary | ICD-10-CM | POA: Diagnosis not present

## 2023-11-05 DIAGNOSIS — E039 Hypothyroidism, unspecified: Secondary | ICD-10-CM | POA: Diagnosis not present

## 2023-11-05 DIAGNOSIS — K5900 Constipation, unspecified: Secondary | ICD-10-CM | POA: Diagnosis not present

## 2023-11-05 DIAGNOSIS — Z7982 Long term (current) use of aspirin: Secondary | ICD-10-CM | POA: Diagnosis not present

## 2023-11-05 DIAGNOSIS — F32A Depression, unspecified: Secondary | ICD-10-CM | POA: Diagnosis not present

## 2023-11-05 DIAGNOSIS — M797 Fibromyalgia: Secondary | ICD-10-CM | POA: Diagnosis not present

## 2023-11-05 DIAGNOSIS — K219 Gastro-esophageal reflux disease without esophagitis: Secondary | ICD-10-CM | POA: Diagnosis not present

## 2023-11-05 DIAGNOSIS — D649 Anemia, unspecified: Secondary | ICD-10-CM | POA: Diagnosis not present

## 2023-11-05 DIAGNOSIS — G5603 Carpal tunnel syndrome, bilateral upper limbs: Secondary | ICD-10-CM | POA: Diagnosis not present

## 2023-11-05 DIAGNOSIS — I2510 Atherosclerotic heart disease of native coronary artery without angina pectoris: Secondary | ICD-10-CM | POA: Diagnosis not present

## 2023-11-05 DIAGNOSIS — Z7951 Long term (current) use of inhaled steroids: Secondary | ICD-10-CM | POA: Diagnosis not present

## 2023-11-05 DIAGNOSIS — F419 Anxiety disorder, unspecified: Secondary | ICD-10-CM | POA: Diagnosis not present

## 2023-11-05 DIAGNOSIS — Z471 Aftercare following joint replacement surgery: Secondary | ICD-10-CM | POA: Diagnosis not present

## 2023-11-05 DIAGNOSIS — Z96652 Presence of left artificial knee joint: Secondary | ICD-10-CM | POA: Diagnosis not present

## 2023-11-05 DIAGNOSIS — I082 Rheumatic disorders of both aortic and tricuspid valves: Secondary | ICD-10-CM | POA: Diagnosis not present

## 2023-11-07 DIAGNOSIS — F32A Depression, unspecified: Secondary | ICD-10-CM | POA: Diagnosis not present

## 2023-11-07 DIAGNOSIS — Z7982 Long term (current) use of aspirin: Secondary | ICD-10-CM | POA: Diagnosis not present

## 2023-11-07 DIAGNOSIS — Z96652 Presence of left artificial knee joint: Secondary | ICD-10-CM | POA: Diagnosis not present

## 2023-11-07 DIAGNOSIS — I2510 Atherosclerotic heart disease of native coronary artery without angina pectoris: Secondary | ICD-10-CM | POA: Diagnosis not present

## 2023-11-07 DIAGNOSIS — E039 Hypothyroidism, unspecified: Secondary | ICD-10-CM | POA: Diagnosis not present

## 2023-11-07 DIAGNOSIS — K219 Gastro-esophageal reflux disease without esophagitis: Secondary | ICD-10-CM | POA: Diagnosis not present

## 2023-11-07 DIAGNOSIS — J45909 Unspecified asthma, uncomplicated: Secondary | ICD-10-CM | POA: Diagnosis not present

## 2023-11-07 DIAGNOSIS — Z471 Aftercare following joint replacement surgery: Secondary | ICD-10-CM | POA: Diagnosis not present

## 2023-11-07 DIAGNOSIS — G43909 Migraine, unspecified, not intractable, without status migrainosus: Secondary | ICD-10-CM | POA: Diagnosis not present

## 2023-11-07 DIAGNOSIS — F419 Anxiety disorder, unspecified: Secondary | ICD-10-CM | POA: Diagnosis not present

## 2023-11-07 DIAGNOSIS — M797 Fibromyalgia: Secondary | ICD-10-CM | POA: Diagnosis not present

## 2023-11-07 DIAGNOSIS — K5900 Constipation, unspecified: Secondary | ICD-10-CM | POA: Diagnosis not present

## 2023-11-07 DIAGNOSIS — Z7951 Long term (current) use of inhaled steroids: Secondary | ICD-10-CM | POA: Diagnosis not present

## 2023-11-07 DIAGNOSIS — G5603 Carpal tunnel syndrome, bilateral upper limbs: Secondary | ICD-10-CM | POA: Diagnosis not present

## 2023-11-07 DIAGNOSIS — I082 Rheumatic disorders of both aortic and tricuspid valves: Secondary | ICD-10-CM | POA: Diagnosis not present

## 2023-11-07 DIAGNOSIS — D649 Anemia, unspecified: Secondary | ICD-10-CM | POA: Diagnosis not present

## 2023-11-08 DIAGNOSIS — Z7951 Long term (current) use of inhaled steroids: Secondary | ICD-10-CM | POA: Diagnosis not present

## 2023-11-08 DIAGNOSIS — J45909 Unspecified asthma, uncomplicated: Secondary | ICD-10-CM | POA: Diagnosis not present

## 2023-11-08 DIAGNOSIS — E039 Hypothyroidism, unspecified: Secondary | ICD-10-CM | POA: Diagnosis not present

## 2023-11-08 DIAGNOSIS — M797 Fibromyalgia: Secondary | ICD-10-CM | POA: Diagnosis not present

## 2023-11-08 DIAGNOSIS — G5603 Carpal tunnel syndrome, bilateral upper limbs: Secondary | ICD-10-CM | POA: Diagnosis not present

## 2023-11-08 DIAGNOSIS — F32A Depression, unspecified: Secondary | ICD-10-CM | POA: Diagnosis not present

## 2023-11-08 DIAGNOSIS — Z7982 Long term (current) use of aspirin: Secondary | ICD-10-CM | POA: Diagnosis not present

## 2023-11-08 DIAGNOSIS — K219 Gastro-esophageal reflux disease without esophagitis: Secondary | ICD-10-CM | POA: Diagnosis not present

## 2023-11-08 DIAGNOSIS — D649 Anemia, unspecified: Secondary | ICD-10-CM | POA: Diagnosis not present

## 2023-11-08 DIAGNOSIS — G43909 Migraine, unspecified, not intractable, without status migrainosus: Secondary | ICD-10-CM | POA: Diagnosis not present

## 2023-11-08 DIAGNOSIS — Z96652 Presence of left artificial knee joint: Secondary | ICD-10-CM | POA: Diagnosis not present

## 2023-11-08 DIAGNOSIS — F419 Anxiety disorder, unspecified: Secondary | ICD-10-CM | POA: Diagnosis not present

## 2023-11-08 DIAGNOSIS — Z471 Aftercare following joint replacement surgery: Secondary | ICD-10-CM | POA: Diagnosis not present

## 2023-11-08 DIAGNOSIS — K5900 Constipation, unspecified: Secondary | ICD-10-CM | POA: Diagnosis not present

## 2023-11-08 DIAGNOSIS — I2510 Atherosclerotic heart disease of native coronary artery without angina pectoris: Secondary | ICD-10-CM | POA: Diagnosis not present

## 2023-11-08 DIAGNOSIS — I082 Rheumatic disorders of both aortic and tricuspid valves: Secondary | ICD-10-CM | POA: Diagnosis not present

## 2023-11-10 DIAGNOSIS — I2510 Atherosclerotic heart disease of native coronary artery without angina pectoris: Secondary | ICD-10-CM | POA: Diagnosis not present

## 2023-11-10 DIAGNOSIS — E039 Hypothyroidism, unspecified: Secondary | ICD-10-CM | POA: Diagnosis not present

## 2023-11-10 DIAGNOSIS — G5603 Carpal tunnel syndrome, bilateral upper limbs: Secondary | ICD-10-CM | POA: Diagnosis not present

## 2023-11-10 DIAGNOSIS — Z96652 Presence of left artificial knee joint: Secondary | ICD-10-CM | POA: Diagnosis not present

## 2023-11-10 DIAGNOSIS — F32A Depression, unspecified: Secondary | ICD-10-CM | POA: Diagnosis not present

## 2023-11-10 DIAGNOSIS — F419 Anxiety disorder, unspecified: Secondary | ICD-10-CM | POA: Diagnosis not present

## 2023-11-10 DIAGNOSIS — K5900 Constipation, unspecified: Secondary | ICD-10-CM | POA: Diagnosis not present

## 2023-11-10 DIAGNOSIS — K219 Gastro-esophageal reflux disease without esophagitis: Secondary | ICD-10-CM | POA: Diagnosis not present

## 2023-11-10 DIAGNOSIS — J45909 Unspecified asthma, uncomplicated: Secondary | ICD-10-CM | POA: Diagnosis not present

## 2023-11-10 DIAGNOSIS — Z7982 Long term (current) use of aspirin: Secondary | ICD-10-CM | POA: Diagnosis not present

## 2023-11-10 DIAGNOSIS — Z471 Aftercare following joint replacement surgery: Secondary | ICD-10-CM | POA: Diagnosis not present

## 2023-11-10 DIAGNOSIS — D649 Anemia, unspecified: Secondary | ICD-10-CM | POA: Diagnosis not present

## 2023-11-10 DIAGNOSIS — I082 Rheumatic disorders of both aortic and tricuspid valves: Secondary | ICD-10-CM | POA: Diagnosis not present

## 2023-11-10 DIAGNOSIS — Z7951 Long term (current) use of inhaled steroids: Secondary | ICD-10-CM | POA: Diagnosis not present

## 2023-11-10 DIAGNOSIS — M797 Fibromyalgia: Secondary | ICD-10-CM | POA: Diagnosis not present

## 2023-11-10 DIAGNOSIS — G43909 Migraine, unspecified, not intractable, without status migrainosus: Secondary | ICD-10-CM | POA: Diagnosis not present

## 2023-11-11 DIAGNOSIS — Z5189 Encounter for other specified aftercare: Secondary | ICD-10-CM | POA: Diagnosis not present

## 2023-11-15 DIAGNOSIS — M25662 Stiffness of left knee, not elsewhere classified: Secondary | ICD-10-CM | POA: Diagnosis not present

## 2023-11-15 DIAGNOSIS — M25562 Pain in left knee: Secondary | ICD-10-CM | POA: Diagnosis not present

## 2023-11-15 DIAGNOSIS — M6281 Muscle weakness (generalized): Secondary | ICD-10-CM | POA: Diagnosis not present

## 2023-11-15 DIAGNOSIS — R262 Difficulty in walking, not elsewhere classified: Secondary | ICD-10-CM | POA: Diagnosis not present

## 2023-11-17 ENCOUNTER — Emergency Department (HOSPITAL_BASED_OUTPATIENT_CLINIC_OR_DEPARTMENT_OTHER)
Admission: EM | Admit: 2023-11-17 | Discharge: 2023-11-18 | Disposition: A | Discharge status: Home / Self Care | Attending: Emergency Medicine | Admitting: Emergency Medicine

## 2023-11-17 ENCOUNTER — Encounter (HOSPITAL_BASED_OUTPATIENT_CLINIC_OR_DEPARTMENT_OTHER): Payer: Self-pay | Admitting: Emergency Medicine

## 2023-11-17 ENCOUNTER — Emergency Department (HOSPITAL_BASED_OUTPATIENT_CLINIC_OR_DEPARTMENT_OTHER)

## 2023-11-17 ENCOUNTER — Other Ambulatory Visit: Payer: Self-pay

## 2023-11-17 DIAGNOSIS — I517 Cardiomegaly: Secondary | ICD-10-CM | POA: Diagnosis not present

## 2023-11-17 DIAGNOSIS — R509 Fever, unspecified: Secondary | ICD-10-CM | POA: Diagnosis not present

## 2023-11-17 DIAGNOSIS — I10 Essential (primary) hypertension: Secondary | ICD-10-CM | POA: Diagnosis not present

## 2023-11-17 DIAGNOSIS — Z79899 Other long term (current) drug therapy: Secondary | ICD-10-CM | POA: Diagnosis not present

## 2023-11-17 DIAGNOSIS — Z91040 Latex allergy status: Secondary | ICD-10-CM | POA: Insufficient documentation

## 2023-11-17 DIAGNOSIS — R110 Nausea: Secondary | ICD-10-CM | POA: Diagnosis not present

## 2023-11-17 DIAGNOSIS — R000 Tachycardia, unspecified: Secondary | ICD-10-CM | POA: Diagnosis not present

## 2023-11-17 DIAGNOSIS — M549 Dorsalgia, unspecified: Secondary | ICD-10-CM | POA: Diagnosis not present

## 2023-11-17 DIAGNOSIS — R109 Unspecified abdominal pain: Secondary | ICD-10-CM | POA: Insufficient documentation

## 2023-11-17 DIAGNOSIS — R0602 Shortness of breath: Secondary | ICD-10-CM | POA: Diagnosis not present

## 2023-11-17 DIAGNOSIS — Z7982 Long term (current) use of aspirin: Secondary | ICD-10-CM | POA: Diagnosis not present

## 2023-11-17 DIAGNOSIS — Z96652 Presence of left artificial knee joint: Secondary | ICD-10-CM | POA: Diagnosis not present

## 2023-11-17 DIAGNOSIS — D72829 Elevated white blood cell count, unspecified: Secondary | ICD-10-CM | POA: Diagnosis not present

## 2023-11-17 DIAGNOSIS — M62830 Muscle spasm of back: Secondary | ICD-10-CM | POA: Diagnosis not present

## 2023-11-17 DIAGNOSIS — J9811 Atelectasis: Secondary | ICD-10-CM | POA: Diagnosis not present

## 2023-11-17 DIAGNOSIS — R1012 Left upper quadrant pain: Secondary | ICD-10-CM | POA: Diagnosis not present

## 2023-11-17 MED ORDER — MORPHINE SULFATE (PF) 4 MG/ML IV SOLN
4.0000 mg | Freq: Once | INTRAVENOUS | Status: AC
  Administered 2023-11-18: 4 mg via INTRAVENOUS
  Filled 2023-11-17: qty 1

## 2023-11-17 MED ORDER — SODIUM CHLORIDE 0.9 % IV BOLUS
1000.0000 mL | Freq: Once | INTRAVENOUS | Status: AC
  Administered 2023-11-18: 1000 mL via INTRAVENOUS

## 2023-11-17 MED ORDER — ONDANSETRON HCL 4 MG/2ML IJ SOLN
4.0000 mg | Freq: Once | INTRAMUSCULAR | Status: AC
  Administered 2023-11-18: 4 mg via INTRAVENOUS
  Filled 2023-11-17: qty 2

## 2023-11-17 NOTE — ED Triage Notes (Signed)
 Pt had total knee replacement 3 weeks ago.  Pt developed fever today about 4pm with back pain and painful inspiration.  Pt has been on daily aspirin since her surgery.

## 2023-11-17 NOTE — ED Provider Notes (Signed)
 [place] Provider Note   CSN: [id number] Arrival date & time: 11/17/23  2252     History  Chief Complaint  Patient presents with   Post-op Problem    Christine [name] is a 53 y.o. female.  The history is provided by the patient.   Christine [name] is a 53 y.o. female who presents to the Emergency Department complaining of back pain and fever.  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  No dysuria.  She does have nausea.  No abdominal pain, cough, vomiting.  No history of DVT.  She does have a Mirena  IUD in place.  She does have a history of hypertension, Hashimoto's.  She is taking aspirin  81 mg for DVT prophylaxis.  She has been taking Tylenol  and ibuprofen  for pain for the last week.     Home Medications Prior to Admission medications   Medication Sig Start Date End Date Taking? Authorizing Provider  azithromycin  (ZITHROMAX ) 250 MG tablet Take 1 tablet (250 mg total) by mouth daily. Take first 2 tablets together, then 1 every day until finished. 11/18/23  Yes Kelsey Patricia, MD  cefdinir  (OMNICEF ) 300 MG capsule Take 1 capsule (300 mg total) by mouth 2 (two) times daily. 11/18/23  Yes Kelsey Patricia, MD  acetaminophen  (TYLENOL ) 650 MG CR tablet Take 1,300 mg by mouth every 8 (eight) hours as needed for pain.    [provider]  albuterol  (VENTOLIN  HFA) 108 (90 Base) MCG/ACT inhaler Inhale 2 puffs into the lungs every 6 (six) hours as needed for wheezing or shortness of breath. 12/28/22   Dorrene Gaucher, NP  Alum Hydroxide-Mag Carbonate (GAVISCON PO) Take 2-3 tablets by mouth 2 (two) times daily as needed (heartburn).    [provider]  amLODipine  (NORVASC ) 5 MG tablet TAKE 1 AND 1/2 TABLETS(7.5 MG) BY MOUTH DAILY Patient taking differently: Take  7.5 mg by mouth at bedtime. 06/27/23   Dorrene Gaucher, NP  aspirin  EC 81 MG tablet Take 1 tablet (81 mg total) by mouth 2 (two) times daily after a meal. Swallow whole. 10/28/23   Orvan Blanch, MD  atorvastatin  (LIPITOR) 20 MG tablet Take 1 tablet (20 mg total) by mouth daily. Patient not taking: Reported on 10/18/2023 06/15/23 10/18/23  Jacqueline Matsu, MD  Azelastine -Fluticasone  137-50 MCG/ACT SUSP Place 1 spray into the nose in the morning and at bedtime. 06/27/23   O'Sullivan, Melissa, NP  calcium  carbonate (TUMS - DOSED IN MG ELEMENTAL CALCIUM ) 500 MG chewable tablet Chew 2 tablets by mouth 2 (two) times daily as needed for indigestion or heartburn.    [provider]  cyanocobalamin  (VITAMIN B12) 1000 MCG/ML injection ADMINISTER 1 ML(1000 MCG) UNDER THE SKIN EVERY 14 Days 12/28/22   O'Sullivan, Melissa, NP  docusate sodium  (COLACE) 100 MG capsule Take 1 capsule (100 mg total) by mouth 2 (two) times daily. 10/28/23 10/27/24  Orvan Blanch, MD  DULoxetine  (CYMBALTA ) 60 MG capsule Take 1 capsule (60 mg total) by mouth daily. Patient taking differently: Take 60 mg by mouth every evening. 06/27/23   O'Sullivan, Melissa, NP  EPINEPHrine  (AUVI-Q ) 0.3 mg/0.3 mL IJ SOAJ injection Inject 0.3 mg into the muscle as needed for anaphylaxis. 05/06/20   Trudy Fusi, DO  fluticasone -salmeterol (ADVAIR DISKUS) 250-50 MCG/ACT AEPB Inhale 1 puff into the lungs in the  morning and at bedtime. Patient taking differently: Inhale 1 puff into the lungs 2 (two) times daily as needed (shortness of breath). 12/28/22   O'Sullivan, Melissa, NP  levocetirizine (XYZAL ) 5 MG tablet Take 1 tablet (5 mg total) by mouth every evening. 07/08/23   Dorrene Gaucher, NP  Levonorgestrel  (MIRENA , 52 MG, IU) by Intrauterine route.    [provider]  levothyroxine  (SYNTHROID ) 125 MCG tablet Take 1 tablet (125 mcg total) by mouth daily before breakfast. 06/27/23   Dorrene Gaucher, NP  loperamide  (IMODIUM  A-D) 2 MG  tablet Take 2-4 mg by mouth 4 (four) times daily as needed for diarrhea or loose stools.    [provider]  methocarbamol  (ROBAXIN ) 500 MG tablet Take 1 tablet (500 mg total) by mouth every 8 (eight) hours as needed for muscle spasms. 10/28/23   Orvan Blanch, MD  montelukast  (SINGULAIR ) 10 MG tablet TAKE 1 TABLET(10 MG) BY MOUTH AT BEDTIME 06/24/23   Dorrene Gaucher, NP  Multiple Vitamins-Minerals (HAIR SKIN AND NAILS FORMULA) TABS Take 2 tablets by mouth daily.    [provider]  oxyCODONE  (OXY IR/ROXICODONE ) 5 MG immediate release tablet Take 1 tablet (5 mg total) by mouth every 4 (four) hours as needed for severe pain (pain score 7-10). 10/28/23   Orvan Blanch, MD  pantoprazole  (PROTONIX ) 40 MG tablet TAKE 1 TABLET BY MOUTH DAILY Patient taking differently: Take 40 mg by mouth at bedtime. 10/02/23   O'Sullivan, Melissa, NP  polyethylene glycol (MIRALAX  / GLYCOLAX ) 17 g packet Take 17 g by mouth daily. 10/28/23   Orvan Blanch, MD  Probiotic Product (PROBIOTIC MULTI-ENZYME) TABS Take 1 tablet by mouth daily as needed (upset stomach).    [provider]  SYRINGE-NEEDLE, DISP, 3 ML (BD INTEGRA SYRINGE) 25G X 1" 3 ML MISC USE AS DIRECTED 08/26/22   O'Sullivan, Melissa, NP      Allergies    Hydrocodone , Sulfa antibiotics, Beef-derived drug products, Dilaudid  [hydromorphone  hcl], Latex, and Penicillins    Review of Systems   Review of Systems  All other systems reviewed and are negative.   Physical Exam Updated Vital Signs BP 127/65   Pulse (!) 103   Temp 98.9 F (37.2 C) (Oral)   Resp 18   SpO2 93%  Physical Exam Vitals and nursing note reviewed.  Constitutional:      Appearance: She is well-developed.  HENT:     Head: Normocephalic and atraumatic.  Cardiovascular:     Rate and Rhythm: Regular rhythm. Tachycardia present.     Heart sounds: No murmur heard. Pulmonary:     Effort: Pulmonary effort is normal. No respiratory distress.     Breath  sounds: Normal breath sounds.  Abdominal:     Palpations: Abdomen is soft.     Tenderness: There is no abdominal tenderness. There is no guarding or rebound.  Musculoskeletal:     Comments: Healing left anterior knee surgical site without erythema.  2+ DP pulses bilaterally.  Mild edema throughout the LLE.   Skin:    General: Skin is warm and dry.  Neurological:     Mental Status: She is alert and oriented to person, place, and time.  Psychiatric:        Behavior: Behavior normal.     ED Results / Procedures / Treatments   Labs (all labs ordered are listed, but only abnormal results are displayed) Labs Reviewed  COMPREHENSIVE METABOLIC PANEL WITH GFR - Abnormal; Notable for the following components:      Result  Value   Sodium 133 (*)    Glucose, Bld 123 (*)    All other components within normal limits  CBC WITH DIFFERENTIAL/PLATELET - Abnormal; Notable for the following components:   WBC 13.7 (*)    Hemoglobin 11.6 (*)    HCT 34.4 (*)    Neutro Abs 11.4 (*)    All other components within normal limits  URINALYSIS, ROUTINE W REFLEX MICROSCOPIC - Abnormal; Notable for the following components:   Hgb urine dipstick SMALL (*)    All other components within normal limits  URINALYSIS, MICROSCOPIC (REFLEX) - Abnormal; Notable for the following components:   Bacteria, UA RARE (*)    All other components within normal limits  RESP PANEL BY RT-PCR (RSV, FLU A&B, COVID)  [id number]  TROPONIN I (HIGH SENSITIVITY)  TROPONIN I (HIGH SENSITIVITY)    EKG EKG Interpretation Date/Time:  Thursday November 17 2023 23:56:17 EDT Ventricular Rate:  107 PR Interval:  128 QRS Duration:  86 QT Interval:  312 QTC Calculation: 417 R Axis:   57  Text Interpretation: Sinus tachycardia Confirmed by Kelsey Patricia [id number]) on 11/17/2023 11:58:00 PM  Radiology CT Angio Chest PE W/Cm &/Or Wo Cm Result Date: 11/18/2023 CLINICAL DATA:  Fever and back pain EXAM: CT ANGIOGRAPHY CHEST WITH CONTRAST  TECHNIQUE: Multidetector CT imaging of the chest was performed using the standard protocol during bolus administration of intravenous contrast. Multiplanar CT image reconstructions and MIPs were obtained to evaluate the vascular anatomy. RADIATION DOSE REDUCTION: This exam was performed according to the departmental dose-optimization program which includes automated exposure control, adjustment of the mA and/or kV according to patient size and/or use of iterative reconstruction technique. CONTRAST:  75mL OMNIPAQUE  IOHEXOL  350 MG/ML SOLN COMPARISON:  Chest x-ray from earlier in the same day. FINDINGS: Cardiovascular: Mild cardiac enlargement is noted. Thoracic aorta is within normal limits. Pulmonary artery shows no intraluminal filling defect to suggest pulmonary embolism. Mediastinum/Nodes: Thoracic inlet is within normal limits. No hilar or mediastinal adenopathy is noted. The esophagus as visualized is within normal limits. Lungs/Pleura: Mild bibasilar atelectatic changes are seen. No sizable effusion is noted. No parenchymal nodules are seen. Upper Abdomen: No acute abnormality. Musculoskeletal: Degenerative changes of the thoracic spine are noted. Review of the MIP images confirms the above findings. IMPRESSION: No evidence of pulmonary emboli. Mild bibasilar atelectatic changes. Electronically Signed   By: Violeta Grey M.D.   On: 11/18/2023 01:35   US  Venous Img Lower  Left (DVT Study) Result Date: 11/18/2023 CLINICAL DATA:  Shortness of breath. Status post left knee replacement. EXAM: LEFT LOWER EXTREMITY VENOUS DOPPLER ULTRASOUND TECHNIQUE: Gray-scale sonography with compression, as well as color and duplex ultrasound, were performed to evaluate the deep venous system(s) from the level of the common femoral vein through the popliteal and proximal calf veins. COMPARISON:  None Available. FINDINGS: VENOUS Normal compressibility of the common femoral, superficial femoral, and popliteal veins, as well as the  visualized calf veins. Visualized portions of profunda femoral vein and great saphenous vein unremarkable. No filling defects to suggest DVT on grayscale or color Doppler imaging. Doppler waveforms show normal direction of venous flow, normal respiratory plasticity and response to augmentation. Limited views of the contralateral common femoral vein are unremarkable. OTHER None. Limitations: none IMPRESSION: Negative. Electronically Signed   By: Janeece Mechanic M.D.   On: 11/18/2023 00:57   DG Chest Port 1 View Result Date: 11/18/2023 CLINICAL DATA:  Shortness of breath EXAM: PORTABLE CHEST 1 VIEW COMPARISON:  06/01/2023 FINDINGS: Heart  and mediastinal contours are within the limits. Bibasilar opacities, favor atelectasis. No effusions. No acute bony abnormality. IMPRESSION: Bibasilar atelectasis. Electronically Signed   By: Janeece Mechanic M.D.   On: 11/18/2023 00:30    Procedures Procedures    Medications Ordered in ED Medications  lidocaine  (LIDODERM ) 5 % 1 patch (1 patch Transdermal Patch Applied 11/18/23 0205)  sodium chloride  0.9 % bolus 1,000 mL (0 mLs Intravenous Stopped 11/18/23 0111)  morphine  (PF) 4 MG/ML injection 4 mg (4 mg Intravenous Given 11/18/23 0004)  ondansetron  (ZOFRAN ) injection 4 mg (4 mg Intravenous Given 11/18/23 0004)  ibuprofen  (ADVIL ) tablet 600 mg (600 mg Oral Given 11/18/23 0032)  iohexol  (OMNIPAQUE ) 350 MG/ML injection 75 mL (75 mLs Intravenous Contrast Given 11/18/23 0119)  cefTRIAXone  (ROCEPHIN ) 1 g in sodium chloride  0.9 % 100 mL IVPB (1 g Intravenous New Bag/Given 11/18/23 0236)  azithromycin  (ZITHROMAX ) tablet 500 mg (500 mg Oral Given 11/18/23 0236)    ED Course/ Medical Decision Making/ A&P                                 Medical Decision Making Amount and/or Complexity of Data Reviewed Labs: ordered. Radiology: ordered.  Risk Prescription drug management.   Patient here for evaluation of acute left-sided thoracic back pain that started this evening, she is 3  weeks status post total knee.  Her surgical site appears to be healing well.  She is febrile on ED arrival, tachycardic.  Her blood pressures are normal.  Chest x-ray with atelectasis, no definite infiltrate.  Given her risk factors a vascular ultrasound as well as CTA were obtained.  These are negative for DVT or PE.  She does have atelectasis in the bases on imaging.  CBC with mild leukocytosis.  UA is not consistent with UTI.  Current picture is not consistent with epidural abscess as she has no midline symptoms.  She does feel somewhat improved after treatment in the emergency department.  Her fever did improve after ibuprofen  administration.  Discussed with patient option for observation given that she meets SIRS criteria, she would prefer discharge if possible.  Will start empiric antibiotics for possible early pneumonia that is not radiographically apparent given her symptoms.  She is in agreement with plan.  Discussed close outpatient follow-up as well as return precautions.        Final Clinical Impression(s) / ED Diagnoses Final diagnoses:  Left flank pain  Leukocytosis, unspecified type    Rx / DC Orders ED Discharge Orders          Ordered    azithromycin  (ZITHROMAX ) 250 MG tablet  Daily        11/18/23 0228    cefdinir  (OMNICEF ) 300 MG capsule  2 times daily        11/18/23 0230              Kelsey Patricia, MD 11/18/23 [phone]

## 2023-11-18 ENCOUNTER — Emergency Department (HOSPITAL_BASED_OUTPATIENT_CLINIC_OR_DEPARTMENT_OTHER)

## 2023-11-18 DIAGNOSIS — R509 Fever, unspecified: Secondary | ICD-10-CM | POA: Diagnosis not present

## 2023-11-18 DIAGNOSIS — M549 Dorsalgia, unspecified: Secondary | ICD-10-CM | POA: Diagnosis not present

## 2023-11-18 DIAGNOSIS — I517 Cardiomegaly: Secondary | ICD-10-CM | POA: Diagnosis not present

## 2023-11-18 LAB — COMPREHENSIVE METABOLIC PANEL WITH GFR
ALT: 18 U/L (ref 0–44)
AST: 20 U/L (ref 15–41)
Albumin: 3.9 g/dL (ref 3.5–5.0)
Alkaline Phosphatase: 104 U/L (ref 38–126)
Anion gap: 9 (ref 5–15)
BUN: 18 mg/dL (ref 6–20)
CO2: 24 mmol/L (ref 22–32)
Calcium: 9.2 mg/dL (ref 8.9–10.3)
Chloride: 100 mmol/L (ref 98–111)
Creatinine, Ser: 0.75 mg/dL (ref 0.44–1.00)
GFR, Estimated: >60 mL/min (ref >60)
Glucose, Bld: 123 mg/dL — ABNORMAL HIGH (ref 70–99)
Potassium: 3.7 mmol/L (ref 3.5–5.1)
Sodium: 133 mmol/L — ABNORMAL LOW (ref 135–145)
Total Bilirubin: 0.6 mg/dL (ref 0.0–1.2)
Total Protein: 7.7 g/dL (ref 6.5–8.1)

## 2023-11-18 LAB — URINALYSIS, ROUTINE W REFLEX MICROSCOPIC
Bilirubin Urine: NEGATIVE
Glucose, UA: NEGATIVE mg/dL
Ketones, ur: NEGATIVE mg/dL
Leukocytes,Ua: NEGATIVE
Nitrite: NEGATIVE
Protein, ur: NEGATIVE mg/dL
Specific Gravity, Urine: 1.015 (ref 1.005–1.030)
pH: 7 (ref 5.0–8.0)

## 2023-11-18 LAB — CBC WITH DIFFERENTIAL/PLATELET
Abs Immature Granulocytes: 0.06 10*3/uL (ref 0.00–0.07)
Basophils Absolute: 0.1 10*3/uL (ref 0.0–0.1)
Basophils Relative: 0 %
Eosinophils Absolute: 0.3 10*3/uL (ref 0.0–0.5)
Eosinophils Relative: 2 %
HCT: 34.4 % — ABNORMAL LOW (ref 36.0–46.0)
Hemoglobin: 11.6 g/dL — ABNORMAL LOW (ref 12.0–15.0)
Immature Granulocytes: 0 %
Lymphocytes Relative: 6 %
Lymphs Abs: 0.9 10*3/uL (ref 0.7–4.0)
MCH: 29.6 pg (ref 26.0–34.0)
MCHC: 33.7 g/dL (ref 30.0–36.0)
MCV: 87.8 fL (ref 80.0–100.0)
Monocytes Absolute: 1 10*3/uL (ref 0.1–1.0)
Monocytes Relative: 7 %
Neutro Abs: 11.4 10*3/uL — ABNORMAL HIGH (ref 1.7–7.7)
Neutrophils Relative %: 85 %
Platelets: 333 10*3/uL (ref 150–400)
RBC: 3.92 MIL/uL (ref 3.87–5.11)
RDW: 13.4 % (ref 11.5–15.5)
WBC: 13.7 10*3/uL — ABNORMAL HIGH (ref 4.0–10.5)
nRBC: 0 % (ref 0.0–0.2)

## 2023-11-18 LAB — RESP PANEL BY RT-PCR (RSV, FLU A&B, COVID)  RVPGX2
Influenza A by PCR: NEGATIVE
Influenza B by PCR: NEGATIVE
Resp Syncytial Virus by PCR: NEGATIVE
SARS Coronavirus 2 by RT PCR: NEGATIVE

## 2023-11-18 LAB — URINALYSIS, MICROSCOPIC (REFLEX)

## 2023-11-18 LAB — TROPONIN I (HIGH SENSITIVITY)
Troponin I (High Sensitivity): 5 ng/L (ref <18)
Troponin I (High Sensitivity): 5 ng/L (ref <18)

## 2023-11-18 MED ORDER — AZITHROMYCIN 250 MG PO TABS
500.0000 mg | ORAL_TABLET | Freq: Once | ORAL | Status: AC
  Administered 2023-11-18: 500 mg via ORAL
  Filled 2023-11-18: qty 2

## 2023-11-18 MED ORDER — LIDOCAINE 5 % EX PTCH
1.0000 | MEDICATED_PATCH | CUTANEOUS | Status: DC
  Administered 2023-11-18: 1 via TRANSDERMAL
  Filled 2023-11-18: qty 1

## 2023-11-18 MED ORDER — CEFDINIR 300 MG PO CAPS: 300.0000 mg | ORAL_CAPSULE | Freq: Two times a day (BID) | ORAL | 0 refills | Status: DC

## 2023-11-18 MED ORDER — IOHEXOL 350 MG/ML SOLN
75.0000 mL | Freq: Once | INTRAVENOUS | Status: AC | PRN
  Administered 2023-11-18: 75 mL via INTRAVENOUS

## 2023-11-18 MED ORDER — AZITHROMYCIN 250 MG PO TABS: 250.0000 mg | ORAL_TABLET | Freq: Every day | ORAL | 0 refills | Status: DC

## 2023-11-18 MED ORDER — IBUPROFEN 400 MG PO TABS
600.0000 mg | ORAL_TABLET | Freq: Once | ORAL | Status: AC
  Administered 2023-11-18: 600 mg via ORAL
  Filled 2023-11-18: qty 1

## 2023-11-18 MED ORDER — SODIUM CHLORIDE 0.9 % IV SOLN
1.0000 g | Freq: Once | INTRAVENOUS | Status: AC
  Administered 2023-11-18: 1 g via INTRAVENOUS
  Filled 2023-11-18: qty 10

## 2023-11-18 NOTE — ED Notes (Signed)
 [place] at bedside

## 2023-11-18 NOTE — ED Notes (Signed)
 To Ct

## 2023-11-18 NOTE — ED Notes (Signed)
 Assisted patient to the bathroom.

## 2023-11-21 DIAGNOSIS — M25662 Stiffness of left knee, not elsewhere classified: Secondary | ICD-10-CM | POA: Diagnosis not present

## 2023-11-21 DIAGNOSIS — M25562 Pain in left knee: Secondary | ICD-10-CM | POA: Diagnosis not present

## 2023-11-21 DIAGNOSIS — M6281 Muscle weakness (generalized): Secondary | ICD-10-CM | POA: Diagnosis not present

## 2023-11-21 DIAGNOSIS — R262 Difficulty in walking, not elsewhere classified: Secondary | ICD-10-CM | POA: Diagnosis not present

## 2023-11-23 DIAGNOSIS — M6281 Muscle weakness (generalized): Secondary | ICD-10-CM | POA: Diagnosis not present

## 2023-11-23 DIAGNOSIS — R262 Difficulty in walking, not elsewhere classified: Secondary | ICD-10-CM | POA: Diagnosis not present

## 2023-11-23 DIAGNOSIS — M25562 Pain in left knee: Secondary | ICD-10-CM | POA: Diagnosis not present

## 2023-11-23 DIAGNOSIS — M25662 Stiffness of left knee, not elsewhere classified: Secondary | ICD-10-CM | POA: Diagnosis not present

## 2023-11-25 DIAGNOSIS — M6281 Muscle weakness (generalized): Secondary | ICD-10-CM | POA: Diagnosis not present

## 2023-11-25 DIAGNOSIS — M25662 Stiffness of left knee, not elsewhere classified: Secondary | ICD-10-CM | POA: Diagnosis not present

## 2023-11-25 DIAGNOSIS — M25562 Pain in left knee: Secondary | ICD-10-CM | POA: Diagnosis not present

## 2023-11-25 DIAGNOSIS — R262 Difficulty in walking, not elsewhere classified: Secondary | ICD-10-CM | POA: Diagnosis not present

## 2023-11-28 DIAGNOSIS — M25662 Stiffness of left knee, not elsewhere classified: Secondary | ICD-10-CM | POA: Diagnosis not present

## 2023-11-28 DIAGNOSIS — R262 Difficulty in walking, not elsewhere classified: Secondary | ICD-10-CM | POA: Diagnosis not present

## 2023-11-28 DIAGNOSIS — M6281 Muscle weakness (generalized): Secondary | ICD-10-CM | POA: Diagnosis not present

## 2023-11-28 DIAGNOSIS — M25562 Pain in left knee: Secondary | ICD-10-CM | POA: Diagnosis not present

## 2023-12-01 DIAGNOSIS — M6281 Muscle weakness (generalized): Secondary | ICD-10-CM | POA: Diagnosis not present

## 2023-12-01 DIAGNOSIS — M25562 Pain in left knee: Secondary | ICD-10-CM | POA: Diagnosis not present

## 2023-12-01 DIAGNOSIS — R262 Difficulty in walking, not elsewhere classified: Secondary | ICD-10-CM | POA: Diagnosis not present

## 2023-12-01 DIAGNOSIS — M25662 Stiffness of left knee, not elsewhere classified: Secondary | ICD-10-CM | POA: Diagnosis not present

## 2023-12-02 ENCOUNTER — Other Ambulatory Visit: Payer: Self-pay | Admitting: Family

## 2023-12-02 DIAGNOSIS — R262 Difficulty in walking, not elsewhere classified: Secondary | ICD-10-CM | POA: Diagnosis not present

## 2023-12-02 DIAGNOSIS — M25662 Stiffness of left knee, not elsewhere classified: Secondary | ICD-10-CM | POA: Diagnosis not present

## 2023-12-02 DIAGNOSIS — M25562 Pain in left knee: Secondary | ICD-10-CM | POA: Diagnosis not present

## 2023-12-02 DIAGNOSIS — M6281 Muscle weakness (generalized): Secondary | ICD-10-CM | POA: Diagnosis not present

## 2023-12-02 DIAGNOSIS — J4540 Moderate persistent asthma, uncomplicated: Secondary | ICD-10-CM

## 2023-12-05 DIAGNOSIS — M6281 Muscle weakness (generalized): Secondary | ICD-10-CM | POA: Diagnosis not present

## 2023-12-05 DIAGNOSIS — M25662 Stiffness of left knee, not elsewhere classified: Secondary | ICD-10-CM | POA: Diagnosis not present

## 2023-12-05 DIAGNOSIS — R262 Difficulty in walking, not elsewhere classified: Secondary | ICD-10-CM | POA: Diagnosis not present

## 2023-12-05 DIAGNOSIS — M25562 Pain in left knee: Secondary | ICD-10-CM | POA: Diagnosis not present

## 2023-12-08 DIAGNOSIS — M25562 Pain in left knee: Secondary | ICD-10-CM | POA: Diagnosis not present

## 2023-12-08 DIAGNOSIS — M6281 Muscle weakness (generalized): Secondary | ICD-10-CM | POA: Diagnosis not present

## 2023-12-08 DIAGNOSIS — M25662 Stiffness of left knee, not elsewhere classified: Secondary | ICD-10-CM | POA: Diagnosis not present

## 2023-12-08 DIAGNOSIS — R262 Difficulty in walking, not elsewhere classified: Secondary | ICD-10-CM | POA: Diagnosis not present

## 2023-12-12 DIAGNOSIS — M25562 Pain in left knee: Secondary | ICD-10-CM | POA: Diagnosis not present

## 2023-12-12 DIAGNOSIS — M6281 Muscle weakness (generalized): Secondary | ICD-10-CM | POA: Diagnosis not present

## 2023-12-12 DIAGNOSIS — R262 Difficulty in walking, not elsewhere classified: Secondary | ICD-10-CM | POA: Diagnosis not present

## 2023-12-12 DIAGNOSIS — M25662 Stiffness of left knee, not elsewhere classified: Secondary | ICD-10-CM | POA: Diagnosis not present

## 2023-12-15 DIAGNOSIS — M6281 Muscle weakness (generalized): Secondary | ICD-10-CM | POA: Diagnosis not present

## 2023-12-15 DIAGNOSIS — M25562 Pain in left knee: Secondary | ICD-10-CM | POA: Diagnosis not present

## 2023-12-15 DIAGNOSIS — M25662 Stiffness of left knee, not elsewhere classified: Secondary | ICD-10-CM | POA: Diagnosis not present

## 2023-12-15 DIAGNOSIS — R262 Difficulty in walking, not elsewhere classified: Secondary | ICD-10-CM | POA: Diagnosis not present

## 2023-12-19 DIAGNOSIS — M25562 Pain in left knee: Secondary | ICD-10-CM | POA: Diagnosis not present

## 2023-12-19 DIAGNOSIS — R262 Difficulty in walking, not elsewhere classified: Secondary | ICD-10-CM | POA: Diagnosis not present

## 2023-12-19 DIAGNOSIS — M6281 Muscle weakness (generalized): Secondary | ICD-10-CM | POA: Diagnosis not present

## 2023-12-19 DIAGNOSIS — M25662 Stiffness of left knee, not elsewhere classified: Secondary | ICD-10-CM | POA: Diagnosis not present

## 2023-12-22 DIAGNOSIS — M25562 Pain in left knee: Secondary | ICD-10-CM | POA: Diagnosis not present

## 2023-12-22 DIAGNOSIS — M6281 Muscle weakness (generalized): Secondary | ICD-10-CM | POA: Diagnosis not present

## 2023-12-22 DIAGNOSIS — R262 Difficulty in walking, not elsewhere classified: Secondary | ICD-10-CM | POA: Diagnosis not present

## 2023-12-22 DIAGNOSIS — M25662 Stiffness of left knee, not elsewhere classified: Secondary | ICD-10-CM | POA: Diagnosis not present

## 2023-12-28 DIAGNOSIS — M6281 Muscle weakness (generalized): Secondary | ICD-10-CM | POA: Diagnosis not present

## 2023-12-28 DIAGNOSIS — R262 Difficulty in walking, not elsewhere classified: Secondary | ICD-10-CM | POA: Diagnosis not present

## 2023-12-28 DIAGNOSIS — M25562 Pain in left knee: Secondary | ICD-10-CM | POA: Diagnosis not present

## 2023-12-28 DIAGNOSIS — M25662 Stiffness of left knee, not elsewhere classified: Secondary | ICD-10-CM | POA: Diagnosis not present

## 2023-12-29 ENCOUNTER — Encounter: Payer: Self-pay | Admitting: Family

## 2023-12-29 ENCOUNTER — Other Ambulatory Visit: Payer: Self-pay | Admitting: Family

## 2023-12-29 DIAGNOSIS — F32A Depression, unspecified: Secondary | ICD-10-CM

## 2023-12-29 DIAGNOSIS — E538 Deficiency of other specified B group vitamins: Secondary | ICD-10-CM

## 2023-12-29 DIAGNOSIS — E039 Hypothyroidism, unspecified: Secondary | ICD-10-CM

## 2023-12-29 DIAGNOSIS — K219 Gastro-esophageal reflux disease without esophagitis: Secondary | ICD-10-CM

## 2023-12-30 ENCOUNTER — Other Ambulatory Visit: Payer: Self-pay | Admitting: Family

## 2023-12-30 DIAGNOSIS — R262 Difficulty in walking, not elsewhere classified: Secondary | ICD-10-CM | POA: Diagnosis not present

## 2023-12-30 DIAGNOSIS — M25662 Stiffness of left knee, not elsewhere classified: Secondary | ICD-10-CM | POA: Diagnosis not present

## 2023-12-30 DIAGNOSIS — M6281 Muscle weakness (generalized): Secondary | ICD-10-CM | POA: Diagnosis not present

## 2023-12-30 DIAGNOSIS — I10 Essential (primary) hypertension: Secondary | ICD-10-CM

## 2023-12-30 DIAGNOSIS — M25562 Pain in left knee: Secondary | ICD-10-CM | POA: Diagnosis not present

## 2023-12-30 NOTE — Telephone Encounter (Signed)
 Please contact pt to schedule follow up.

## 2023-12-30 NOTE — Telephone Encounter (Signed)
Lvm 2 schedule.

## 2024-01-02 DIAGNOSIS — M6281 Muscle weakness (generalized): Secondary | ICD-10-CM | POA: Diagnosis not present

## 2024-01-02 DIAGNOSIS — R262 Difficulty in walking, not elsewhere classified: Secondary | ICD-10-CM | POA: Diagnosis not present

## 2024-01-02 DIAGNOSIS — M25662 Stiffness of left knee, not elsewhere classified: Secondary | ICD-10-CM | POA: Diagnosis not present

## 2024-01-02 DIAGNOSIS — M25562 Pain in left knee: Secondary | ICD-10-CM | POA: Diagnosis not present

## 2024-01-06 DIAGNOSIS — R262 Difficulty in walking, not elsewhere classified: Secondary | ICD-10-CM | POA: Diagnosis not present

## 2024-01-06 DIAGNOSIS — M25562 Pain in left knee: Secondary | ICD-10-CM | POA: Diagnosis not present

## 2024-01-06 DIAGNOSIS — M25662 Stiffness of left knee, not elsewhere classified: Secondary | ICD-10-CM | POA: Diagnosis not present

## 2024-01-06 DIAGNOSIS — M6281 Muscle weakness (generalized): Secondary | ICD-10-CM | POA: Diagnosis not present

## 2024-01-11 DIAGNOSIS — M25562 Pain in left knee: Secondary | ICD-10-CM | POA: Diagnosis not present

## 2024-01-11 DIAGNOSIS — M25662 Stiffness of left knee, not elsewhere classified: Secondary | ICD-10-CM | POA: Diagnosis not present

## 2024-01-11 DIAGNOSIS — M6281 Muscle weakness (generalized): Secondary | ICD-10-CM | POA: Diagnosis not present

## 2024-01-11 DIAGNOSIS — R262 Difficulty in walking, not elsewhere classified: Secondary | ICD-10-CM | POA: Diagnosis not present

## 2024-01-13 DIAGNOSIS — M25662 Stiffness of left knee, not elsewhere classified: Secondary | ICD-10-CM | POA: Diagnosis not present

## 2024-01-13 DIAGNOSIS — M6281 Muscle weakness (generalized): Secondary | ICD-10-CM | POA: Diagnosis not present

## 2024-01-13 DIAGNOSIS — M25562 Pain in left knee: Secondary | ICD-10-CM | POA: Diagnosis not present

## 2024-01-13 DIAGNOSIS — R262 Difficulty in walking, not elsewhere classified: Secondary | ICD-10-CM | POA: Diagnosis not present

## 2024-01-18 DIAGNOSIS — M6281 Muscle weakness (generalized): Secondary | ICD-10-CM | POA: Diagnosis not present

## 2024-01-18 DIAGNOSIS — R262 Difficulty in walking, not elsewhere classified: Secondary | ICD-10-CM | POA: Diagnosis not present

## 2024-01-18 DIAGNOSIS — M25662 Stiffness of left knee, not elsewhere classified: Secondary | ICD-10-CM | POA: Diagnosis not present

## 2024-01-18 DIAGNOSIS — M25562 Pain in left knee: Secondary | ICD-10-CM | POA: Diagnosis not present

## 2024-01-25 ENCOUNTER — Other Ambulatory Visit: Payer: Self-pay | Admitting: Family

## 2024-01-25 DIAGNOSIS — F32A Depression, unspecified: Secondary | ICD-10-CM

## 2024-01-25 DIAGNOSIS — I10 Essential (primary) hypertension: Secondary | ICD-10-CM

## 2024-01-25 DIAGNOSIS — K219 Gastro-esophageal reflux disease without esophagitis: Secondary | ICD-10-CM

## 2024-01-25 DIAGNOSIS — E039 Hypothyroidism, unspecified: Secondary | ICD-10-CM

## 2024-01-25 DIAGNOSIS — E538 Deficiency of other specified B group vitamins: Secondary | ICD-10-CM

## 2024-01-25 NOTE — Telephone Encounter (Signed)
 Please contact pt to schedule a follow up visit.

## 2024-02-07 ENCOUNTER — Encounter: Payer: Self-pay | Admitting: Family

## 2024-02-07 ENCOUNTER — Telehealth: Payer: Self-pay | Admitting: Family

## 2024-02-07 ENCOUNTER — Ambulatory Visit (INDEPENDENT_AMBULATORY_CARE_PROVIDER_SITE_OTHER): Admitting: Family

## 2024-02-07 VITALS — BP 126/78 | HR 92 | Temp 98.2°F | Wt 228.0 lb

## 2024-02-07 DIAGNOSIS — R739 Hyperglycemia, unspecified: Secondary | ICD-10-CM

## 2024-02-07 DIAGNOSIS — I10 Essential (primary) hypertension: Secondary | ICD-10-CM | POA: Diagnosis not present

## 2024-02-07 DIAGNOSIS — Z1211 Encounter for screening for malignant neoplasm of colon: Secondary | ICD-10-CM

## 2024-02-07 DIAGNOSIS — Z2989 Encounter for other specified prophylactic measures: Secondary | ICD-10-CM

## 2024-02-07 DIAGNOSIS — E538 Deficiency of other specified B group vitamins: Secondary | ICD-10-CM | POA: Diagnosis not present

## 2024-02-07 DIAGNOSIS — K219 Gastro-esophageal reflux disease without esophagitis: Secondary | ICD-10-CM

## 2024-02-07 DIAGNOSIS — E785 Hyperlipidemia, unspecified: Secondary | ICD-10-CM

## 2024-02-07 DIAGNOSIS — J302 Other seasonal allergic rhinitis: Secondary | ICD-10-CM

## 2024-02-07 DIAGNOSIS — Z91030 Bee allergy status: Secondary | ICD-10-CM | POA: Insufficient documentation

## 2024-02-07 DIAGNOSIS — I2510 Atherosclerotic heart disease of native coronary artery without angina pectoris: Secondary | ICD-10-CM

## 2024-02-07 DIAGNOSIS — F32A Depression, unspecified: Secondary | ICD-10-CM | POA: Diagnosis not present

## 2024-02-07 DIAGNOSIS — J3089 Other allergic rhinitis: Secondary | ICD-10-CM

## 2024-02-07 DIAGNOSIS — E039 Hypothyroidism, unspecified: Secondary | ICD-10-CM | POA: Diagnosis not present

## 2024-02-07 DIAGNOSIS — J4540 Moderate persistent asthma, uncomplicated: Secondary | ICD-10-CM

## 2024-02-07 LAB — BASIC METABOLIC PANEL WITH GFR
BUN: 13 mg/dL (ref 6–23)
CO2: 27 meq/L (ref 19–32)
Calcium: 10 mg/dL (ref 8.4–10.5)
Chloride: 100 meq/L (ref 96–112)
Creatinine, Ser: 0.71 mg/dL (ref 0.40–1.20)
GFR: 98.01 mL/min (ref >60.00)
Glucose, Bld: 96 mg/dL (ref 70–99)
Potassium: 4.5 meq/L (ref 3.5–5.1)
Sodium: 137 meq/L (ref 135–145)

## 2024-02-07 LAB — TSH: TSH: 0.77 u[IU]/mL (ref 0.35–5.50)

## 2024-02-07 LAB — VITAMIN B12: Vitamin B-12: 504 pg/mL (ref 211–911)

## 2024-02-07 LAB — HEMOGLOBIN A1C: Hgb A1c MFr Bld: 6.2 % (ref 4.6–6.5)

## 2024-02-07 MED ORDER — CYANOCOBALAMIN 1000 MCG/ML IJ SOLN: 1000.0000 ug | INTRAMUSCULAR | 6 refills | Status: AC

## 2024-02-07 MED ORDER — PANTOPRAZOLE SODIUM 40 MG PO TBEC: 40.0000 mg | DELAYED_RELEASE_TABLET | Freq: Every day | ORAL | 1 refills | Status: DC

## 2024-02-07 MED ORDER — DULOXETINE HCL 60 MG PO CPEP: 60.0000 mg | ORAL_CAPSULE | Freq: Every day | ORAL | 1 refills | Status: DC

## 2024-02-07 MED ORDER — LEVOTHYROXINE SODIUM 125 MCG PO TABS: 125.0000 ug | ORAL_TABLET | Freq: Every day | ORAL | 1 refills | Status: DC

## 2024-02-07 MED ORDER — ASPIRIN 81 MG PO TBEC: 81.0000 mg | DELAYED_RELEASE_TABLET | Freq: Every day | ORAL | Status: AC

## 2024-02-07 MED ORDER — AMLODIPINE BESYLATE 5 MG PO TABS: ORAL_TABLET | ORAL | 1 refills | Status: DC

## 2024-02-07 MED ORDER — LEVOCETIRIZINE DIHYDROCHLORIDE 5 MG PO TABS: 5.0000 mg | ORAL_TABLET | Freq: Every evening | ORAL | 1 refills | Status: DC

## 2024-02-07 MED ORDER — AZELASTINE-FLUTICASONE 137-50 MCG/ACT NA SUSP: 1.0000 | Freq: Two times a day (BID) | NASAL | 5 refills | Status: DC

## 2024-02-07 MED ORDER — EPINEPHRINE 0.3 MG/0.3ML IJ SOAJ
0.3000 mg | INTRAMUSCULAR | 2 refills | Status: DC | PRN
Start: 2024-02-07 — End: 2024-04-18

## 2024-02-07 NOTE — Assessment & Plan Note (Signed)
 Lab Results  Component Value Date   TSH 0.91 06/01/2023   Update TSH continue synthroid .

## 2024-02-07 NOTE — Assessment & Plan Note (Signed)
 Stable on dymista , xyzal  and singulair . Continue same.

## 2024-02-07 NOTE — Progress Notes (Signed)
 Subjective:     Patient ID: Christine [name], female    DOB: [birth year], 53 y.o.   MRN: [id number]  Chief Complaint  Patient presents with   Medical Management of Chronic Issues    HPI  Discussed the use of AI scribe software for clinical note transcription with the patient, who gave verbal consent to proceed.  History of Present Illness  Christine [name] is a 53 year old female with hypertension and coronary artery disease who presents for a follow-up visit.  She is 14 weeks post knee surgery with significant improvement in pain and has completed physical therapy, continuing exercises at home. She takes amlodipine  7.5 mg daily for hypertension and manages cholesterol with dietary changes, avoiding Lipitor due to concerns about joint pain. Her mood has improved on Cymbalta , but she experiences menopause symptoms without hormone replacement therapy. She uses Singulair  for asthma and a rescue inhaler rarely. Wixela is used as needed during allergy  seasons, with spring being the worst. Protonix  effectively manages her GERD. She administers B12 injections at home and considers adjusting the injection site. Meloxicam  is used for pain management during increased activity. She has not completed a Cologuard test and is due for a mammogram and Pap smear, which she plans to schedule. Her EpiPen  is not updated, but she has never needed to use it.  Lab Results  Component Value Date   HGBA1C 5.7 04/10/2021      Health Maintenance Due  Topic Date Due   Fecal DNA (Cologuard)  Never done   MAMMOGRAM  02/27/2022   Cervical Cancer Screening (HPV/Pap Cotest)  02/28/2024    Past Medical History:  Diagnosis Date   Allergy     Anemia    stated h/o pernicious anemia   Anxiety    Aortic insufficiency    mild to moderate   Arthritis    osteoarthritis   Asthma    Acute asthmatic bronchitis 2011 while pregnant   CAD (coronary artery disease), native coronary artery    25% distal LAD, < 25%  ostial-prox-mid LAD/distal left main/prox and distal RCA CTA 06/2023   Carpal tunnel syndrome    both wrist   Depression    Diverticulitis    recurrent   Family history of anesthesia complication    nausea and vomiting   Fibromyalgia    GERD (gastroesophageal reflux disease)    Hypothyroidism    Migraine, unspecified, without mention of intractable migraine without mention of status migrainosus    Miscarriage 2007   Pneumonia    PONV (postoperative nausea and vomiting)    Thyroid  disease    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   TOTAL KNEE ARTHROPLASTY Left 10/28/2023   Procedure: ARTHROPLASTY, KNEE, TOTAL;  Surgeon: Duwayne Purchase, MD;  Location: [facility];  Service: Orthopedics;  Laterality: Left;    Family History  Problem Relation Age of Onset   Barrett's esophagus Mother    Coronary artery disease Mother    Congestive Heart Failure Mother    Hypertension Mother    Fibromyalgia Mother    Chronic fatigue Mother  Anemia Mother    Irritable bowel syndrome Mother    Atrial fibrillation Mother    Colon polyps Father    Diabetes Father    Hypertension Father    Heart disease Father    Graves' disease Maternal Grandmother    CAD Maternal Grandfather    Ovarian cancer Paternal Grandmother    Diabetes Paternal Grandfather    Ehlers-Danlos syndrome Daughter    Anemia Daughter    Other Daughter        Postural orthostatic hypotension   Irritable bowel syndrome Daughter    Diabetes Paternal Aunt    Crohn's disease Cousin    Alcohol abuse Other    Arthritis Other    Hyperlipidemia  Other    Hypertension Other    Stroke Other    Coronary artery disease Other    Clotting disorder Other        Great Grandfather   Colon cancer Neg Hx     Social History   Socioeconomic History   Marital status: Married    Spouse name: Not on file   Number of children: 2   Years of education: Not on file   Highest education level: Some college, no degree  Occupational History   Not on file  Tobacco Use   Smoking status: Former    Current packs/day: 0.00    Average packs/day: 0.5 packs/day for 3.0 years (1.5 ttl pk-yrs)    Types: Cigarettes    Start date: 01/30/1997    Quit date: 01/31/2000    Years since quitting: 24.0   Smokeless tobacco: Never  Vaping Use   Vaping status: Never Used  Substance and Sexual Activity   Alcohol use: Yes    Alcohol/week: 2.0 standard drinks of alcohol    Types: 2 Glasses of wine per week    Comment: social   Drug use: No   Sexual activity: Yes    Partners: Male    Birth control/protection: I.U.D.  Other Topics Concern   Not on file  Social History Narrative   Married, 2 daughters (26 and 57) son age 55   Occupation: Housewife   Daily Caffeine Use   Patient does not get regular exercise.   Social Drivers of Corporate investment banker Strain: Low Risk  (02/06/2024)   Overall Financial Resource Strain (CARDIA)    Difficulty of Paying Living Expenses: Not hard at all  Food Insecurity: No Food Insecurity (02/06/2024)   Hunger Vital Sign    Worried About Running Out of Food in the Last Year: Never true    Ran Out of Food in the Last Year: Never true  Transportation Needs: No Transportation Needs (02/06/2024)   PRAPARE - Administrator, Civil Service (Medical): No    Lack of Transportation (Non-Medical): No  Physical Activity: Inactive (02/06/2024)   Exercise Vital Sign    Days of Exercise per Week: 0 days    Minutes of Exercise per Session: Not on file  Stress: No Stress Concern Present (02/06/2024)   [employer] of  Occupational Health - Occupational Stress Questionnaire    Feeling of Stress: Only a little  Social Connections: Socially Integrated (02/06/2024)   Social Connection and Isolation Panel    Frequency of Communication with Friends and Family: Three times a week    Frequency of Social Gatherings with Friends and Family: Twice a week    Attends Religious Services: More than 4 times per year    Active Member of [employer] or Organizations:  Yes    Attends Club or Organization Meetings: More than 4 times per year    Marital Status: Married  Catering manager Violence: Not At Risk (10/28/2023)   Humiliation, Afraid, Rape, and Kick questionnaire    Fear of Current or Ex-Partner: No    Emotionally Abused: No    Physically Abused: No    Sexually Abused: No    Outpatient Medications Prior to Visit  Medication Sig Dispense Refill   acetaminophen  (TYLENOL ) 650 MG CR tablet Take 1,300 mg by mouth every 8 (eight) hours as needed for pain.     albuterol  (VENTOLIN  HFA) 108 (90 Base) MCG/ACT inhaler Inhale 2 puffs into the lungs every 6 (six) hours as needed for wheezing or shortness of breath. 6.7 g 3   Alum Hydroxide-Mag Carbonate (GAVISCON PO) Take 2-3 tablets by mouth 2 (two) times daily as needed (heartburn).     fluticasone -salmeterol (ADVAIR DISKUS) 250-50 MCG/ACT AEPB Inhale 1 puff into the lungs in the morning and at bedtime. (Patient taking differently: Inhale 1 puff into the lungs 2 (two) times daily as needed (shortness of breath).) 60 each 3   Levonorgestrel  (MIRENA , 52 MG, IU) by Intrauterine route.     loperamide  (IMODIUM  A-D) 2 MG tablet Take 2-4 mg by mouth 4 (four) times daily as needed for diarrhea or loose stools.     montelukast  (SINGULAIR ) 10 MG tablet TAKE 1 TABLET BY MOUTH AT BEDTIME 90 tablet 1   Multiple Vitamins-Minerals (HAIR SKIN AND NAILS FORMULA) TABS Take 2 tablets by mouth daily.     Probiotic Product (PROBIOTIC MULTI-ENZYME) TABS Take 1 tablet by mouth daily as needed (upset  stomach).     SYRINGE-NEEDLE, DISP, 3 ML (BD INTEGRA SYRINGE) 25G X 1 3 ML MISC USE AS DIRECTED 12 each 0   amLODipine  (NORVASC ) 5 MG tablet TAKE 1.5 TABLET BY MOUTH DAILY 45 tablet 0   aspirin  EC 81 MG tablet Take 1 tablet (81 mg total) by mouth 2 (two) times daily after a meal. Swallow whole. 90 tablet 3   Azelastine -Fluticasone  137-50 MCG/ACT SUSP Place 1 spray into the nose in the morning and at bedtime. 23 g 5   calcium  carbonate (TUMS - DOSED IN MG ELEMENTAL CALCIUM ) 500 MG chewable tablet Chew 2 tablets by mouth 2 (two) times daily as needed for indigestion or heartburn.     cyanocobalamin  (VITAMIN B12) 1000 MCG/ML injection INJECT 1ML INTO MUSCLE EVERY 14 DAYS, LAB TESTING AN APPOINTMENT REQUIRED FOR FURTHER REFILLS 2 mL 0   DULoxetine  (CYMBALTA ) 60 MG capsule TAKE 1 CAPSULE BY MOUTH DAILY 30 capsule 0   EPINEPHrine  (AUVI-Q ) 0.3 mg/0.3 mL IJ SOAJ injection Inject 0.3 mg into the muscle as needed for anaphylaxis. 1 each 2   levocetirizine (XYZAL ) 5 MG tablet Take 1 tablet (5 mg total) by mouth every evening. 90 tablet 1   levothyroxine  (SYNTHROID ) 125 MCG tablet TAKE 1 TABLET BY MOUTH DAILY BEFORE BREAKFAST 30 tablet 0   methocarbamol  (ROBAXIN ) 500 MG tablet Take 1 tablet (500 mg total) by mouth every 8 (eight) hours as needed for muscle spasms. 30 tablet 1   pantoprazole  (PROTONIX ) 40 MG tablet TAKE 1 TABLET BY MOUTH DAILY 30 tablet 0   atorvastatin  (LIPITOR) 20 MG tablet Take 1 tablet (20 mg total) by mouth daily. (Patient not taking: Reported on 10/18/2023) 90 tablet 3   azithromycin  (ZITHROMAX ) 250 MG tablet Take 1 tablet (250 mg total) by mouth daily. Take first 2 tablets together, then 1 every day until  finished. 4 tablet 0   cefdinir  (OMNICEF ) 300 MG capsule Take 1 capsule (300 mg total) by mouth 2 (two) times daily. 14 capsule 0   docusate sodium  (COLACE) 100 MG capsule Take 1 capsule (100 mg total) by mouth 2 (two) times daily. 60 capsule 2   oxyCODONE  (OXY IR/ROXICODONE ) 5 MG  immediate release tablet Take 1 tablet (5 mg total) by mouth every 4 (four) hours as needed for severe pain (pain score 7-10). 40 tablet 0   polyethylene glycol (MIRALAX  / GLYCOLAX ) 17 g packet Take 17 g by mouth daily. 14 each 0   No facility-administered medications prior to visit.    Allergies  Allergen Reactions   Hydrocodone  Nausea Only and Other (See Comments)    Hallucination    Sulfa Antibiotics Rash   Dilaudid  [Hydromorphone  Hcl]     Family history of having a reaction to this drug   Latex Rash   Penicillins Rash and Other (See Comments)    Childhood allergy     ROS    See HPI Objective:    Physical Exam Constitutional:      General: She is not in acute distress.    Appearance: Normal appearance. She is well-developed.  HENT:     Head: Normocephalic and atraumatic.     Right Ear: External ear normal.     Left Ear: External ear normal.  Eyes:     General: No scleral icterus. Neck:     Thyroid : No thyromegaly.  Cardiovascular:     Rate and Rhythm: Normal rate and regular rhythm.     Heart sounds: Normal heart sounds. No murmur heard. Pulmonary:     Effort: Pulmonary effort is normal. No respiratory distress.     Breath sounds: Normal breath sounds. No wheezing.  Musculoskeletal:     Cervical back: Neck supple.  Skin:    General: Skin is warm and dry.  Neurological:     Mental Status: She is alert and oriented to person, place, and time.  Psychiatric:        Mood and Affect: Mood normal.        Behavior: Behavior normal.        Thought Content: Thought content normal.        Judgment: Judgment normal.      BP 126/78   Pulse 92   Temp 98.2 F (36.8 C)   Wt 228 lb (103.4 kg)   SpO2 97%   BMI 34.67 kg/m  Wt Readings from Last 3 Encounters:  02/07/24 228 lb (103.4 kg)  10/28/23 228 lb (103.4 kg)  10/19/23 228 lb (103.4 kg)       Assessment & Plan:   Problem List Items Addressed This Visit       Unprioritized   Seasonal allergies   Stable  on dymista , xyzal  and singulair . Continue same.       Relevant Medications   levocetirizine (XYZAL ) 5 MG tablet   Azelastine -Fluticasone  137-50 MCG/ACT SUSP   Primary hypertension - Primary   BP Readings from Last 3 Encounters:  02/07/24 126/78  11/18/23 121/61  10/29/23 (!) 125/59   Stable on amlodipine  7.5mg  once daily.      Relevant Medications   amLODipine  (NORVASC ) 5 MG tablet   EPINEPHrine  (AUVI-Q ) 0.3 mg/0.3 mL IJ SOAJ injection   aspirin  EC 81 MG tablet   Other allergic rhinitis   Stable on dymista  and xyzal .  Continue same.       Moderate persistent asthma   Has wixela, uses  generally in the spring.  Continues singulair .       Hypothyroidism   Lab Results  Component Value Date   TSH 0.91 06/01/2023   Update TSH continue synthroid .       Relevant Medications   levothyroxine  (SYNTHROID ) 125 MCG tablet   Other Relevant Orders   TSH   Hyperlipidemia   Never started atorvastatin . Discussed that cardiovascular risk reduction benefit of statin, especially in setting of known CAD.  She is agreeable to begin.      Relevant Medications   amLODipine  (NORVASC ) 5 MG tablet   EPINEPHrine  (AUVI-Q ) 0.3 mg/0.3 mL IJ SOAJ injection   aspirin  EC 81 MG tablet   GASTROESOPHAGEAL REFLUX DISEASE   Stable on pantoprazole . Continue same.      Relevant Medications   pantoprazole  (PROTONIX ) 40 MG tablet   Depression   Stable on cymbalta .       Relevant Medications   DULoxetine  (CYMBALTA ) 60 MG capsule   CAD (coronary artery disease), native coronary artery   Relevant Medications   amLODipine  (NORVASC ) 5 MG tablet   EPINEPHrine  (AUVI-Q ) 0.3 mg/0.3 mL IJ SOAJ injection   aspirin  EC 81 MG tablet   B12 deficiency   Continues b12 injections every 14 days.       Relevant Medications   cyanocobalamin  (VITAMIN B12) 1000 MCG/ML injection   EPINEPHrine  (AUVI-Q ) 0.3 mg/0.3 mL IJ SOAJ injection   Other Relevant Orders   B12   Other Visit Diagnoses       Hyperglycemia        Relevant Orders   HgB A1c   Basic Metabolic Panel (BMET)       I have discontinued Misha C. Lamar's calcium  carbonate, methocarbamol , oxyCODONE , docusate sodium , polyethylene glycol, azithromycin , and cefdinir . I have also changed her cyanocobalamin , DULoxetine , pantoprazole , levothyroxine , and aspirin  EC. Additionally, I am having her maintain her (Levonorgestrel  (MIRENA , 52 MG, IU)), BD Integra Syringe, albuterol , fluticasone -salmeterol, atorvastatin , acetaminophen , Hair Skin and Nails Formula, Alum Hydroxide-Mag Carbonate (GAVISCON PO), loperamide , Probiotic Multi-Enzyme, montelukast , amLODipine , levocetirizine, Azelastine -Fluticasone , and EPINEPHrine .  Meds ordered this encounter  Medications   cyanocobalamin  (VITAMIN B12) 1000 MCG/ML injection    Sig: Inject 1 mL (1,000 mcg total) into the muscle every 14 (fourteen) days.    Dispense:  6 mL    Refill:  6    Supervising Provider:   DOMENICA BLACKBIRD A [[id number]]   DULoxetine  (CYMBALTA ) 60 MG capsule    Sig: Take 1 capsule (60 mg total) by mouth daily.    Dispense:  90 capsule    Refill:  1    Supervising Provider:   DOMENICA BLACKBIRD A [[id number]]   pantoprazole  (PROTONIX ) 40 MG tablet    Sig: Take 1 tablet (40 mg total) by mouth daily.    Dispense:  90 tablet    Refill:  1    Supervising Provider:   DOMENICA BLACKBIRD A [[id number]]   levothyroxine  (SYNTHROID ) 125 MCG tablet    Sig: Take 1 tablet (125 mcg total) by mouth daily before breakfast.    Dispense:  90 tablet    Refill:  1    Supervising Provider:   DOMENICA BLACKBIRD A [[id number]]   amLODipine  (NORVASC ) 5 MG tablet    Sig: TAKE 1.5 TABLET BY MOUTH DAILY    Dispense:  135 tablet    Refill:  1    Supervising Provider:   DOMENICA BLACKBIRD A [[id number]]   levocetirizine (XYZAL ) 5 MG tablet    Sig: Take 1 tablet (5 mg total) by  mouth every evening.    Dispense:  90 tablet    Refill:  1    Supervising Provider:   DOMENICA BLACKBIRD A [[id number]]   Azelastine -Fluticasone  137-50 MCG/ACT SUSP    Sig: Place 1 spray  into the nose in the morning and at bedtime.    Dispense:  23 g    Refill:  5    Supervising Provider:   DOMENICA BLACKBIRD A [[id number]]   EPINEPHrine  (AUVI-Q ) 0.3 mg/0.3 mL IJ SOAJ injection    Sig: Inject 0.3 mg into the muscle as needed for anaphylaxis.    Dispense:  1 each    Refill:  2    Supervising Provider:   DOMENICA BLACKBIRD A [[id number]]   aspirin  EC 81 MG tablet    Sig: Take 1 tablet (81 mg total) by mouth daily. Swallow whole.    Supervising Provider:   DOMENICA BLACKBIRD A [phone]

## 2024-02-07 NOTE — Telephone Encounter (Signed)
 See mychart.

## 2024-02-07 NOTE — Patient Instructions (Signed)
 VISIT SUMMARY:  You had a follow-up visit to discuss your hypertension, coronary artery disease, and post-knee surgery recovery. Your blood pressure is well-controlled, and your knee pain has significantly improved. We also reviewed your asthma, allergies, GERD, and menopause symptoms, and discussed starting Lipitor for cholesterol management.  YOUR PLAN:  EARWAX BUILDUP: You have a sensation of water  in your ear likely due to earwax buildup. -Continue using over-the-counter earwax removal kits weekly.  POST-KNEE REPLACEMENT STATUS: You are 14 weeks post-surgery with improved pain and have completed physical therapy. -Continue home exercises to maintain knee function.  HYPERLIPIDEMIA: You have not started Lipitor due to concerns about joint pain, but it is important for reducing heart attack and stroke risk. -Start taking Lipitor at nighttime. -Monitor for any muscle pain and report if it occurs.  HYPERTENSION: Your blood pressure is well-controlled on your current medication. -Continue taking amlodipine  7.5 mg daily.  ASTHMA: Your asthma is well-controlled with your current medications. -Continue taking Singulair  regularly. -Use Wixela as needed during allergy  seasons or when symptoms increase.  ALLERGIC RHINITIS: Your allergies are better in the summer but worst in the spring. -Continue using nasal spray and Xyzal  for allergy  management.  GASTROESOPHAGEAL REFLUX DISEASE (GERD): Your reflux is well-controlled with Protonix . -Continue taking Protonix  for GERD management.   VITAMIN B12 DEFICIENCY: You administer B12 injections at home. -Ensure B12 injections are administered intramuscularly. -Refill B12 and needles.  GENERAL HEALTH MAINTENANCE: You are due for several routine health screenings. -Schedule a mammogram and Pap smear with your gynecologist. -Complete the Cologuard test. -Schedule a dermatology appointment.  FOLLOW-UP: We need to reassess your cholesterol and other  health parameters. -Return for a follow-up visit in three months.

## 2024-02-07 NOTE — Assessment & Plan Note (Signed)
 Start statin, continue aspirin .

## 2024-02-07 NOTE — Assessment & Plan Note (Addendum)
 Never started atorvastatin . Discussed that cardiovascular risk reduction benefit of statin, especially in setting of known CAD.  She is agreeable to begin.

## 2024-02-07 NOTE — Assessment & Plan Note (Signed)
 BP Readings from Last 3 Encounters:  02/07/24 126/78  11/18/23 121/61  10/29/23 (!) 125/59   Stable on amlodipine  7.5mg  once daily.

## 2024-02-07 NOTE — Assessment & Plan Note (Signed)
 Stable on dymista  and xyzal .  Continue same.

## 2024-02-07 NOTE — Assessment & Plan Note (Signed)
 Continues b12 injections every 14 days.

## 2024-02-07 NOTE — Assessment & Plan Note (Signed)
Stable on pantoprazole. Continue same.

## 2024-02-07 NOTE — Assessment & Plan Note (Signed)
Stable on cymbalta.  

## 2024-02-07 NOTE — Assessment & Plan Note (Signed)
 Has wixela, uses generally in the spring.  Continues singulair .

## 2024-02-08 ENCOUNTER — Ambulatory Visit: Payer: Self-pay | Admitting: Family

## 2024-02-16 ENCOUNTER — Encounter: Payer: Self-pay | Admitting: Family

## 2024-02-16 DIAGNOSIS — Z888 Allergy status to other drugs, medicaments and biological substances status: Secondary | ICD-10-CM

## 2024-02-16 DIAGNOSIS — I2510 Atherosclerotic heart disease of native coronary artery without angina pectoris: Secondary | ICD-10-CM

## 2024-02-16 DIAGNOSIS — E785 Hyperlipidemia, unspecified: Secondary | ICD-10-CM

## 2024-02-20 ENCOUNTER — Other Ambulatory Visit: Payer: Self-pay | Admitting: Family

## 2024-02-20 DIAGNOSIS — E538 Deficiency of other specified B group vitamins: Secondary | ICD-10-CM

## 2024-02-20 DIAGNOSIS — F32A Depression, unspecified: Secondary | ICD-10-CM

## 2024-02-20 DIAGNOSIS — K219 Gastro-esophageal reflux disease without esophagitis: Secondary | ICD-10-CM

## 2024-02-20 DIAGNOSIS — E039 Hypothyroidism, unspecified: Secondary | ICD-10-CM

## 2024-02-27 MED ORDER — REPATHA SURECLICK 140 MG/ML ~~LOC~~ SOAJ: 140.0000 mg | SUBCUTANEOUS | 2 refills | Status: DC

## 2024-03-06 DIAGNOSIS — Z96652 Presence of left artificial knee joint: Secondary | ICD-10-CM | POA: Diagnosis not present

## 2024-03-15 ENCOUNTER — Encounter: Payer: Self-pay | Admitting: Family

## 2024-03-16 ENCOUNTER — Other Ambulatory Visit: Payer: Self-pay

## 2024-03-16 DIAGNOSIS — E538 Deficiency of other specified B group vitamins: Secondary | ICD-10-CM

## 2024-03-16 MED ORDER — "BD INTEGRA SYRINGE 25G X 1"" 3 ML MISC": 0 refills | Status: DC

## 2024-03-27 DIAGNOSIS — L821 Other seborrheic keratosis: Secondary | ICD-10-CM | POA: Diagnosis not present

## 2024-03-27 DIAGNOSIS — Z129 Encounter for screening for malignant neoplasm, site unspecified: Secondary | ICD-10-CM | POA: Diagnosis not present

## 2024-03-27 DIAGNOSIS — B078 Other viral warts: Secondary | ICD-10-CM | POA: Diagnosis not present

## 2024-03-27 DIAGNOSIS — D485 Neoplasm of uncertain behavior of skin: Secondary | ICD-10-CM | POA: Diagnosis not present

## 2024-03-27 DIAGNOSIS — D225 Melanocytic nevi of trunk: Secondary | ICD-10-CM | POA: Diagnosis not present

## 2024-03-29 ENCOUNTER — Ambulatory Visit: Payer: Self-pay

## 2024-03-29 NOTE — Telephone Encounter (Addendum)
 FYI Only or Action Required?: Action required by provider: clinical question for provider.  Patient was last seen in primary care on 02/07/2024 by Daryl Setter, NP.  Called Nurse Triage reporting Medication Reaction.  Symptoms began several weeks ago.  Interventions attempted: Nothing.  Symptoms are: stable.  Triage Disposition: Call PCP When Office is Open  Patient/caregiver understands and will follow disposition?: Yes                             Copied from CRM [id number]. Topic: Clinical - Medication Question >> Mar 29, 2024  2:32 PM Robinson H wrote: Reason for CRM: Patient started taking Evolocumab  (REPATHA  SURECLICK) 140 MG/ML SOAJ recently and has issues with medication every time she takes it, each time she takes it she feels achy, gets headaches and mood swings, just doesn't like the way she feels when she takes it. Wants to know should she stop medication and come in for or an appointment or does she have to get adjusted to medication.  [place] [phone] Reason for Disposition  Caller wants to use a complementary or alternative medicine  Answer Assessment - Initial Assessment Questions 1. NAME of MEDICINE: What medicine(s) are you calling about?     REPATHA  injection  2. QUESTION: What is your question? (e.g., double dose of medicine, side effect)     Side effects after starting REPATHA  injection on 03/10/24, patient would like to know if she should stop taking medication or if an alternative can be prescribed 3. PRESCRIBER: Who prescribed the medicine? Reason: if prescribed by specialist, call should be referred to that group.     PCP 4. SYMPTOMS: Do you have any symptoms? If Yes, ask: What symptoms are you having?  How bad are the symptoms (e.g., mild, moderate, severe)     Muscle aches, mood swings and headache, elevated BP (160/90 today), pain in thumb joints, fatigue, denies chest pain, denies difficulty breathing, denies  facial swelling, denies blurred vision  Protocols used: Medication Question Call-A-AH

## 2024-04-09 DIAGNOSIS — Z1151 Encounter for screening for human papillomavirus (HPV): Secondary | ICD-10-CM | POA: Diagnosis not present

## 2024-04-09 DIAGNOSIS — N951 Menopausal and female climacteric states: Secondary | ICD-10-CM | POA: Diagnosis not present

## 2024-04-09 DIAGNOSIS — R6882 Decreased libido: Secondary | ICD-10-CM | POA: Diagnosis not present

## 2024-04-09 DIAGNOSIS — Z6836 Body mass index (BMI) 36.0-36.9, adult: Secondary | ICD-10-CM | POA: Diagnosis not present

## 2024-04-09 DIAGNOSIS — Z124 Encounter for screening for malignant neoplasm of cervix: Secondary | ICD-10-CM | POA: Diagnosis not present

## 2024-04-09 DIAGNOSIS — L659 Nonscarring hair loss, unspecified: Secondary | ICD-10-CM | POA: Diagnosis not present

## 2024-04-09 DIAGNOSIS — Z01419 Encounter for gynecological examination (general) (routine) without abnormal findings: Secondary | ICD-10-CM | POA: Diagnosis not present

## 2024-04-09 DIAGNOSIS — E559 Vitamin D deficiency, unspecified: Secondary | ICD-10-CM | POA: Diagnosis not present

## 2024-04-09 LAB — HM PAP SMEAR
HM Pap smear: NEGATIVE
HPV, high-risk: NEGATIVE

## 2024-04-17 ENCOUNTER — Ambulatory Visit: Payer: Self-pay

## 2024-04-17 NOTE — Telephone Encounter (Signed)
 FYI Only or Action Required?: FYI only for provider.  Patient was last seen in primary care on 02/07/2024 by Daryl Setter, NP.  Called Nurse Triage reporting Hypertension.  Symptoms began a week ago.  Interventions attempted: Prescription medications: amlodipine  and Rest, hydration, or home remedies.  Symptoms are: unchanged.  Triage Disposition: See PCP When Office is Open (Within 3 Days)  Patient/caregiver understands and will follow disposition?: Yes  Copied from CRM [id number]. Topic: Clinical - Red Word Triage >> Apr 17, 2024  2:16 PM [place] J wrote: [facility] that prompted transfer to Nurse Triage: Patient has been having elevated blood pressure of 160/90. This has caused the patient to have very bad headaches that turn into migraines. Reason for Disposition  Systolic BP >= 160 OR Diastolic >= 100  Answer Assessment - Initial Assessment Questions 1. BLOOD PRESSURE: What is your blood pressure? Did you take at least two measurements 5 minutes apart?     160/90  168/92 2. ONSET: When did you take your blood pressure?     today 3. HOW: How did you take your blood pressure? (e.g., automatic home BP monitor, visiting nurse)     Automatic home BP monitor 4. HISTORY: Do you have a history of high blood pressure?     yes 5. MEDICINES: Are you taking any medicines for blood pressure? Have you missed any doses recently?     amlodipine  6. OTHER SYMPTOMS: Do you have any symptoms? (e.g., blurred vision, chest pain, difficulty breathing, headache, weakness)     Headache-right side of head and neck-hx of migraines and where she normally gets her headache.   Patient with hx of headaches and migraines. Patient reports side effects from Repatha -fatigue, joint pain, mood changes. Patient reports headaches also. Not sure if headaches started before increased blood pressure.  Protocols used: Blood Pressure - High-A-AH

## 2024-04-18 ENCOUNTER — Encounter: Payer: Self-pay | Admitting: Student

## 2024-04-18 ENCOUNTER — Ambulatory Visit (INDEPENDENT_AMBULATORY_CARE_PROVIDER_SITE_OTHER): Admitting: Student

## 2024-04-18 VITALS — BP 130/80 | HR 96 | Temp 98.3°F | Resp 12 | Ht 68.0 in | Wt 234.2 lb

## 2024-04-18 DIAGNOSIS — G43809 Other migraine, not intractable, without status migrainosus: Secondary | ICD-10-CM

## 2024-04-18 DIAGNOSIS — I10 Essential (primary) hypertension: Secondary | ICD-10-CM | POA: Diagnosis not present

## 2024-04-18 DIAGNOSIS — E785 Hyperlipidemia, unspecified: Secondary | ICD-10-CM | POA: Diagnosis not present

## 2024-04-18 MED ORDER — AMLODIPINE BESYLATE 10 MG PO TABS: 10.0000 mg | ORAL_TABLET | Freq: Every day | ORAL | 1 refills | Status: DC

## 2024-04-18 NOTE — Addendum Note (Signed)
 Addended by: WHEELER HARLENE CROME on: 04/18/2024 09:17 AM   Modules accepted: Level of Service

## 2024-04-18 NOTE — Progress Notes (Signed)
 Acute Office Visit  Subjective:     Patient ID: Christine [name], female    DOB: 09/07/1970, 53 y.o.   MRN: [id number]  Chief Complaint  Patient presents with   Hypertension    HPI   Christine [name] is a 53 year old female with hypertension who presents with HA and elevated blood pressure. She reports a Hx with migraines, but have been well controlled until the last 2 weeks. Reports migraines for the past two weeks, starting last Monday, located behind her eyes and in her neck, with light sensitivity and occasional nausea. Reports migraines are typically linked to her menstrual cycle or weather changes but have never lasted this long or affect ADLs.  Her blood pressure has been elevated during this period, with readings of 158/80-90 at a gynecologist appointment and 154/87 at home. She started Repatha  on August 9th for cholesterol mngmt, with a second dose on August 23rd, and reports adverse SEs after each injection, experiencing migraines, fatigue, joint pain, and malaise. She had a migraine shortly after the second injection.   She manages migraines by resting in a dark room and has tried dry needling with some relief. She has not been prescribed specific migraine medication in the past. She occasionally takes meloxicam  for knee pain after knee replacement surgery in March but not regularly.  Patient denies fever, chills, SOB, CP, palpitations, dyspnea, edema, vision changes, N/V/D, abdominal pain, urinary symptoms, rash, weight changes, and recent illness or hospitalizations.    ROS  See HPI    Objective:    BP 130/80 (BP Location: Right Arm, Patient Position: Sitting, Cuff Size: Large)   Pulse 96   Temp 98.3 F (36.8 C) (Oral)   Resp 12   Ht 5' 8 (1.727 m)   Wt 234 lb 3.2 oz (106.2 kg)   SpO2 96%   BMI 35.61 kg/m    Physical Exam  General: No acute distress. Awake and conversant.  Eyes: Normal conjunctiva, anicteric. Round symmetric pupils.  ENT: Hearing  grossly intact. No nasal discharge.  Neck: Neck is supple. No masses or thyromegaly.  Respiratory: CTAB. Respirations are non-labored. No wheezing.  Skin: Warm. No rashes or ulcers.  Psych: Alert and oriented. Cooperative, Appropriate mood and affect, Normal judgment.  CV: RRR. No murmur. No lower extremity edema.  MSK: Normal ambulation. No clubbing or cyanosis.  Neuro:  CN II-XII grossly normal.    No results found for any visits on 04/18/24.      Assessment & Plan:   Problem List Items Addressed This Visit     Hyperlipidemia   Relevant Medications   amLODipine  (NORVASC ) 10 MG tablet   Primary hypertension - Primary   Poorly controlled from Pt reports at home.   Will increase amlodipine  to 10 mg daily.   Encouraged  patient to monitor blood pressure at home.  Encouraged DASH diet, minimize caffeine and obtain adequate sleep. Report concerning symptoms and follow up as directed and as needed.      Relevant Medications   amLODipine  (NORVASC ) 10 MG tablet   Other Visit Diagnoses       Other migraine without status migrainosus, not intractable       Relevant Medications   amLODipine  (NORVASC ) 10 MG tablet      Hx Migraine Pt has Hx of migraines, which have been controlled until recently. Reports more consiustent migraines  over the past 2 weeks.  Potential triggers include weather changes, hormones, and Repatha  medication. - Consider ibuprofen  or  acetaminophen , alternating meds q 4-6 hours -Continue dry-needling; Explore massage therapy for tension-related HA. -Encourage stress reduction techniques - Consider triptans if migraines worsen. - Monitor for red flag symptoms and seek emergency care if she occurs.  Cholesterol Management Discussed cholesterol management and Repatha  side effects. She is not taking Repatha  due to side effects. - Discuss cholesterol management and alternatives to Repatha  at next follow-up.    Meds ordered this encounter  Medications    amLODipine  (NORVASC ) 10 MG tablet    Sig: Take 1 tablet (10 mg total) by mouth daily.    Dispense:  90 tablet    Refill:  1    Supervising Provider:   DOMENICA BLACKBIRD A [[id number]]    No follow-ups on file.  Lindzie Boxx L Daylan Juhnke, NP

## 2024-04-18 NOTE — Assessment & Plan Note (Addendum)
 Poorly controlled from Pt reports at home.   Will increase amlodipine  to 10 mg daily.   Encouraged  patient to monitor blood pressure at home.  Encouraged DASH diet, minimize caffeine and obtain adequate sleep. Report concerning symptoms and follow up as directed and as needed.

## 2024-04-25 DIAGNOSIS — R6882 Decreased libido: Secondary | ICD-10-CM | POA: Diagnosis not present

## 2024-04-25 DIAGNOSIS — N951 Menopausal and female climacteric states: Secondary | ICD-10-CM | POA: Diagnosis not present

## 2024-04-25 DIAGNOSIS — R7401 Elevation of levels of liver transaminase levels: Secondary | ICD-10-CM | POA: Diagnosis not present

## 2024-04-25 DIAGNOSIS — Z309 Encounter for contraceptive management, unspecified: Secondary | ICD-10-CM | POA: Diagnosis not present

## 2024-05-09 ENCOUNTER — Telehealth: Payer: Self-pay | Admitting: Family

## 2024-05-09 ENCOUNTER — Ambulatory Visit: Admitting: Family

## 2024-05-09 VITALS — BP 124/58 | HR 87 | Temp 98.9°F | Resp 16 | Ht 68.0 in | Wt 233.6 lb

## 2024-05-09 DIAGNOSIS — E785 Hyperlipidemia, unspecified: Secondary | ICD-10-CM

## 2024-05-09 DIAGNOSIS — G43909 Migraine, unspecified, not intractable, without status migrainosus: Secondary | ICD-10-CM | POA: Diagnosis not present

## 2024-05-09 DIAGNOSIS — E039 Hypothyroidism, unspecified: Secondary | ICD-10-CM

## 2024-05-09 DIAGNOSIS — E538 Deficiency of other specified B group vitamins: Secondary | ICD-10-CM

## 2024-05-09 DIAGNOSIS — I10 Essential (primary) hypertension: Secondary | ICD-10-CM

## 2024-05-09 DIAGNOSIS — J3089 Other allergic rhinitis: Secondary | ICD-10-CM

## 2024-05-09 DIAGNOSIS — J4540 Moderate persistent asthma, uncomplicated: Secondary | ICD-10-CM | POA: Diagnosis not present

## 2024-05-09 DIAGNOSIS — Z23 Encounter for immunization: Secondary | ICD-10-CM

## 2024-05-09 DIAGNOSIS — J45909 Unspecified asthma, uncomplicated: Secondary | ICD-10-CM

## 2024-05-09 DIAGNOSIS — F32A Depression, unspecified: Secondary | ICD-10-CM

## 2024-05-09 MED ORDER — FLUTICASONE-SALMETEROL 250-50 MCG/ACT IN AEPB: 1.0000 | INHALATION_SPRAY | Freq: Two times a day (BID) | RESPIRATORY_TRACT | 5 refills | Status: DC

## 2024-05-09 MED ORDER — NURTEC 75 MG PO TBDP: ORAL_TABLET | ORAL | 0 refills | Status: DC

## 2024-05-09 MED ORDER — KETOROLAC TROMETHAMINE 60 MG/2ML IM SOLN
60.0000 mg | Freq: Once | INTRAMUSCULAR | Status: AC
  Administered 2024-05-09: 60 mg via INTRAMUSCULAR

## 2024-05-09 NOTE — Assessment & Plan Note (Signed)
 Improved off of repatha . Continue cymbalta  60mg .

## 2024-05-09 NOTE — Progress Notes (Signed)
 Subjective:     Patient ID: Christine [name], female    DOB: [birth year], 53 y.o.   MRN: [id number]  Chief Complaint  Patient presents with   Hypertension    Here for follow up   B 12 deficiency    Here for follow up, on 1000mcg IM every 14 days   Headache    Patient complains of recent headaches   Hyperlipidemia    Here for follow up    Hypertension Associated symptoms include headaches.  Headache  Her past medical history is significant for hypertension.  Hyperlipidemia    Discussed the use of AI scribe software for clinical note transcription with the patient, who gave verbal consent to proceed.  History of Present Illness  Christine [name] is a 53 year old female with scleroderma who presents with worsening migraines and side effects from Repatha .  She started Repatha  and experienced significant side effects, including feeling achy, depressed, and having migraine headaches. These symptoms began in the first week of starting the medication and persisted into the second week, leading her to stop the medication after consulting with a nurse. The last dose was taken on March 24, 2024.  Since discontinuing Repatha , she has experienced persistent migraines, with one severe episode lasting seven to eight days. Migraines typically start in her neck, radiate upwards, and are located behind her eyes. These episodes are debilitating, often requiring her to lay down in a dark room. Her blood pressure was high during a recent migraine episode, which was concerning to her gynecologist.  She has migraines typically occurring during her menstrual cycle or with weather changes, but notes an increase in frequency over the past month and a half. She is currently taking amlodipine  10 mg for blood pressure, which was recently increased from 7.5 mg.  She did not experience headaches during a recent vacation, suggesting stress may be a contributing factor. She is currently taking Cymbalta  60  mg for mood, Synthroid  125 mcg for thyroid , and B12 injections every two weeks. She also uses Xyzal  and a nasal spray for allergies, and Singulair .     Health Maintenance Due  Topic Date Due   Pneumococcal Vaccine: 50+ Years (1 of 2 - PCV) Never done   Fecal DNA (Cologuard)  Never done   COVID-19 Vaccine (3 - Pfizer risk series) 01/22/2020   Mammogram  02/27/2022   Cervical Cancer Screening (HPV/Pap Cotest)  02/28/2024   Influenza Vaccine  03/02/2024    Past Medical History:  Diagnosis Date   Allergy     Anemia    stated h/o pernicious anemia   Anxiety    Aortic insufficiency    mild to moderate   Arthritis    osteoarthritis   Asthma    Acute asthmatic bronchitis 2011 while pregnant   CAD (coronary artery disease), native coronary artery    25% distal LAD, < 25% ostial-prox-mid LAD/distal left main/prox and distal RCA CTA 06/2023   Carpal tunnel syndrome    both wrist   Depression    Diverticulitis    recurrent   Family history of anesthesia complication    nausea and vomiting   Fibromyalgia    GERD (gastroesophageal reflux disease)    Hypertension 2016   Hypothyroidism    Migraine, unspecified, without mention of intractable migraine without mention of status migrainosus    Miscarriage 2007   Pneumonia    PONV (postoperative nausea and vomiting)    Thyroid  disease    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   TOTAL KNEE ARTHROPLASTY Left 10/28/2023   Procedure: ARTHROPLASTY, KNEE, TOTAL;  Surgeon: Duwayne Purchase, MD;  Location: [facility];  Service: Orthopedics;  Laterality: Left;     Family History  Problem Relation Age of Onset   Barrett's esophagus Mother    Coronary artery disease Mother    Congestive Heart Failure Mother    Hypertension Mother    Fibromyalgia Mother    Chronic fatigue Mother    Anemia Mother    Irritable bowel syndrome Mother    Atrial fibrillation Mother    Arrhythmia Mother    Heart disease Mother    Colon polyps Father    Diabetes Father    Hypertension Father    Heart disease Father    Graves' disease Maternal Grandmother    CAD Maternal Grandfather    Ovarian cancer Paternal Grandmother    Diabetes Paternal Grandfather    Ehlers-Danlos syndrome Daughter    Anemia Daughter    Other Daughter        Postural orthostatic hypotension   Irritable bowel syndrome Daughter    Diabetes Paternal Aunt    Crohn's disease Cousin    Alcohol abuse Other    Arthritis Other    Hyperlipidemia Other    Hypertension Other    Stroke Other    Coronary artery disease Other    Clotting disorder Other        Great Grandfather   Colon cancer Neg Hx     Social History   Socioeconomic History   Marital status: Married    Spouse name: Not on file   Number of children: 2   Years of education: Not on file   Highest education level: Some college, no degree  Occupational History   Not on file  Tobacco Use   Smoking status: Former    Current packs/day: 0.00    Average packs/day: 0.5 packs/day for 3.0 years (1.5 ttl pk-yrs)    Types: Cigarettes    Start date: 01/30/1997    Quit date: 01/31/2000    Years since quitting: 24.2   Smokeless tobacco: Never  Vaping Use   Vaping status: Never Used  Substance and Sexual Activity   Alcohol use: Yes    Alcohol/week: 2.0 standard drinks of alcohol    Types: 2 Glasses of wine per week    Comment: social   Drug use: No   Sexual activity: Yes    Partners: Male    Birth control/protection: I.U.D.  Other Topics Concern   Not on file  Social History Narrative   Married, 2 daughters (94 and 85) son  age 4   Occupation: Housewife   Daily Caffeine Use   Patient does not get regular exercise.   Social Drivers of Corporate investment banker Strain: Low Risk  (02/06/2024)   Overall Financial Resource Strain (CARDIA)    Difficulty of Paying Living Expenses: Not hard at all  Food Insecurity: No Food Insecurity (02/06/2024)   Hunger Vital Sign    Worried About Running Out of Food in the Last Year: Never true  Ran Out of Food in the Last Year: Never true  Transportation Needs: No Transportation Needs (02/06/2024)   PRAPARE - Administrator, Civil Service (Medical): No    Lack of Transportation (Non-Medical): No  Physical Activity: Inactive (02/06/2024)   Exercise Vital Sign    Days of Exercise per Week: 0 days    Minutes of Exercise per Session: Not on file  Stress: No Stress Concern Present (02/06/2024)   [employer] of Occupational Health - Occupational Stress Questionnaire    Feeling of Stress: Only a little  Social Connections: Socially Integrated (02/06/2024)   Social Connection and Isolation Panel    Frequency of Communication with Friends and Family: Three times a week    Frequency of Social Gatherings with Friends and Family: Twice a week    Attends Religious Services: More than 4 times per year    Active Member of [employer] or Organizations: Yes    Attends Engineer, structural: More than 4 times per year    Marital Status: Married  Catering manager Violence: Not At Risk (10/28/2023)   Humiliation, Afraid, Rape, and Kick questionnaire    Fear of Current or Ex-Partner: No    Emotionally Abused: No    Physically Abused: No    Sexually Abused: No    Outpatient Medications Prior to Visit  Medication Sig Dispense Refill   acetaminophen  (TYLENOL ) 650 MG CR tablet Take 1,300 mg by mouth every 8 (eight) hours as needed for pain.     albuterol  (VENTOLIN  HFA) 108 (90 Base) MCG/ACT inhaler Inhale 2 puffs into the lungs every 6 (six) hours as needed for wheezing or  shortness of breath. 6.7 g 3   Alum Hydroxide-Mag Carbonate (GAVISCON PO) Take 2-3 tablets by mouth 2 (two) times daily as needed (heartburn).     amLODipine  (NORVASC ) 10 MG tablet Take 1 tablet (10 mg total) by mouth daily. 90 tablet 1   aspirin  EC 81 MG tablet Take 1 tablet (81 mg total) by mouth daily. Swallow whole.     Azelastine -Fluticasone  137-50 MCG/ACT SUSP Place 1 spray into the nose in the morning and at bedtime. 23 g 5   cyanocobalamin  (VITAMIN B12) 1000 MCG/ML injection Inject 1 mL (1,000 mcg total) into the muscle every 14 (fourteen) days. 6 mL 6   DULoxetine  (CYMBALTA ) 60 MG capsule Take 1 capsule (60 mg total) by mouth daily. 90 capsule 1   levocetirizine (XYZAL ) 5 MG tablet Take 1 tablet (5 mg total) by mouth every evening. 90 tablet 1   Levonorgestrel  (MIRENA , 52 MG, IU) by Intrauterine route.     levothyroxine  (SYNTHROID ) 125 MCG tablet Take 1 tablet (125 mcg total) by mouth daily before breakfast. 90 tablet 1   loperamide  (IMODIUM  A-D) 2 MG tablet Take 2-4 mg by mouth 4 (four) times daily as needed for diarrhea or loose stools.     montelukast  (SINGULAIR ) 10 MG tablet TAKE 1 TABLET BY MOUTH AT BEDTIME 90 tablet 1   Multiple Vitamins-Minerals (HAIR SKIN AND NAILS FORMULA) TABS Take 2 tablets by mouth daily.     pantoprazole  (PROTONIX ) 40 MG tablet Take 1 tablet (40 mg total) by mouth daily. 90 tablet 1   Probiotic Product (PROBIOTIC MULTI-ENZYME) TABS Take 1 tablet by mouth daily as needed (upset stomach).     SYRINGE-NEEDLE, DISP, 3 ML (BD INTEGRA SYRINGE) 25G X 1 3 ML MISC USE AS DIRECTED 12 each 0   fluticasone -salmeterol (ADVAIR DISKUS) 250-50 MCG/ACT AEPB Inhale 1 puff into the  lungs in the morning and at bedtime. (Patient taking differently: Inhale 1 puff into the lungs 2 (two) times daily as needed (shortness of breath).) 60 each 3   No facility-administered medications prior to visit.    Allergies  Allergen Reactions   Hydrocodone  Nausea Only and Other (See  Comments)    Hallucination    Sulfa Antibiotics Rash   Dilaudid  [Hydromorphone  Hcl]     Family history of having a reaction to this drug   Statins     Lip swelling   Latex Rash   Penicillins Rash and Other (See Comments)    Childhood allergy     Review of Systems  Neurological:  Positive for headaches.       Objective:    Physical Exam Constitutional:      General: She is not in acute distress.    Appearance: Normal appearance. She is well-developed.  HENT:     Head: Normocephalic and atraumatic.     Right Ear: External ear normal.     Left Ear: External ear normal.  Eyes:     General: No scleral icterus. Neck:     Thyroid : No thyromegaly.  Cardiovascular:     Rate and Rhythm: Normal rate and regular rhythm.     Heart sounds: Normal heart sounds. No murmur heard. Pulmonary:     Effort: Pulmonary effort is normal. No respiratory distress.     Breath sounds: Normal breath sounds. No wheezing.  Musculoskeletal:     Cervical back: Neck supple.  Skin:    General: Skin is warm and dry.  Neurological:     Mental Status: She is alert and oriented to person, place, and time.  Psychiatric:        Mood and Affect: Mood normal.        Behavior: Behavior normal.        Thought Content: Thought content normal.        Judgment: Judgment normal.      BP (!) 124/58 (BP Location: Right Arm, Patient Position: Sitting, Cuff Size: Large)   Pulse 87   Temp 98.9 F (37.2 C) (Oral)   Resp 16   Ht 5' 8 (1.727 m)   Wt 233 lb 9.6 oz (106 kg)   SpO2 98%   BMI 35.52 kg/m  Wt Readings from Last 3 Encounters:  05/09/24 233 lb 9.6 oz (106 kg)  04/18/24 234 lb 3.2 oz (106.2 kg)  02/07/24 228 lb (103.4 kg)       Assessment & Plan:   Problem List Items Addressed This Visit       Unprioritized   Primary hypertension   Stable on amlodipine  10mg . Continue same.      Other allergic rhinitis   Better, using singulair , azelastine -fluticasone . Continue same.       Moderate  persistent asthma   Stable- requesting refill on wixela. Continue singulair .       Relevant Medications   fluticasone -salmeterol (WIXELA INHUB) 250-50 MCG/ACT AEPB   Migraine without status migrainosus, not intractable - Primary   Uncontrolled. Hx of CAD, so triptans contraindicated. Will rx nurtec and give her toradol  60mg  IM here today in the office.       Relevant Medications   Rimegepant Sulfate (NURTEC) 75 MG TBDP   Hypothyroidism   Lab Results  Component Value Date   TSH 0.77 02/07/2024   Stable on synthroid  125 mcg, continue same.      Hyperlipidemia   + statin allergy , intolerant to Repatha .  Depression   Improved off of repatha . Continue cymbalta  60mg .       B12 deficiency   Stable on q 2 week b12 injections at home.       Other Visit Diagnoses       Uncomplicated asthma, unspecified asthma severity, unspecified whether persistent       Relevant Medications   fluticasone -salmeterol (WIXELA INHUB) 250-50 MCG/ACT AEPB       I have discontinued Polette C. Coglianese's fluticasone -salmeterol. I am also having her start on Nurtec and fluticasone -salmeterol. Additionally, I am having her maintain her (Levonorgestrel  (MIRENA , 52 MG, IU)), albuterol , acetaminophen , Hair Skin and Nails Formula, Alum Hydroxide-Mag Carbonate (GAVISCON PO), loperamide , Probiotic Multi-Enzyme, montelukast , cyanocobalamin , DULoxetine , pantoprazole , levothyroxine , levocetirizine, Azelastine -Fluticasone , aspirin  EC, BD Integra Syringe, and amLODipine .  Meds ordered this encounter  Medications   Rimegepant Sulfate (NURTEC) 75 MG TBDP    Sig: One tablet by mouth once daily as needed for migraine    Dispense:  30 tablet    Refill:  0    Supervising Provider:   DOMENICA BLACKBIRD A [[id number]]   fluticasone -salmeterol (WIXELA INHUB) 250-50 MCG/ACT AEPB    Sig: Inhale 1 puff into the lungs in the morning and at bedtime.    Dispense:  1 each    Refill:  5    Supervising Provider:   DOMENICA BLACKBIRD A  [[id number]]

## 2024-05-09 NOTE — Patient Instructions (Addendum)
 VISIT SUMMARY:  During your visit, we discussed your worsening migraines and side effects from Repatha , as well as your ongoing management of hypertension, hyperlipidemia, allergic rhinitis, hypothyroidism, depression, and vitamin B12 deficiency.  YOUR PLAN:  MIGRAINE: You have been experiencing chronic migraines, which have worsened recently, possibly due to Repatha , hormonal changes, or high blood pressure. -We prescribed Nurtec to take as needed for migraines and sent the prescription to [facility]. -You received a Toradol  injection for immediate headache relief. -If your migraines persist, we may refer you to a neurologist. -Please monitor your response to Nurtec and let us  know if adjustments are needed.  HYPERTENSION: Your blood pressure medication, amlodipine , was recently increased from 7.5 mg to 10 mg. -Continue taking amlodipine  10 mg daily.  HYPERLIPIDEMIA: You have high cholesterol and previously had side effects from Repatha . -We will consult with a pharmacist to find alternative medications to lower your cholesterol. -Please continue to exercise and follow dietary recommendations. -Consider taking fish oil supplements to help with elevated triglycerides.  ALLERGIC RHINITIS: Your allergies are being managed with nasal spray and Xyzal , and your symptoms have improved. -Continue using your nasal spray and Xyzal . -We have refilled your wixela prescription at [facility].  HYPOTHYROIDISM: Your thyroid  levels are stable on Synthroid  125 mcg. -Continue taking Synthroid  125 mcg daily.  DEPRESSION: Your depression symptoms have improved after stopping Repatha , and you are stable on Cymbalta  60 mg. -Continue taking Cymbalta  60 mg daily.  VITAMIN B12 DEFICIENCY: You are managing your B12 deficiency with regular injections. -Continue your B12 injections every two weeks.

## 2024-05-09 NOTE — Assessment & Plan Note (Signed)
+   statin allergy , intolerant to Repatha .

## 2024-05-09 NOTE — Assessment & Plan Note (Signed)
 Uncontrolled. Hx of CAD, so triptans contraindicated. Will rx nurtec and give her toradol  60mg  IM here today in the office.

## 2024-05-09 NOTE — Telephone Encounter (Signed)
 Please [facility] copy [facility]

## 2024-05-09 NOTE — Telephone Encounter (Signed)
 Electronic request made

## 2024-05-09 NOTE — Assessment & Plan Note (Signed)
 Stable on amlodipine  10mg .  Continue same.

## 2024-05-09 NOTE — Assessment & Plan Note (Signed)
 Lab Results  Component Value Date   TSH 0.77 02/07/2024   Stable on synthroid  125 mcg, continue same.

## 2024-05-09 NOTE — Assessment & Plan Note (Signed)
 Stable- requesting refill on wixela. Continue singulair .

## 2024-05-09 NOTE — Assessment & Plan Note (Signed)
 Better, using singulair , azelastine -fluticasone . Continue same.

## 2024-05-09 NOTE — Assessment & Plan Note (Signed)
 Stable on q 2 week b12 injections at home.

## 2024-05-23 ENCOUNTER — Encounter: Payer: Self-pay | Admitting: Family

## 2024-06-01 ENCOUNTER — Other Ambulatory Visit: Payer: Self-pay | Admitting: Family

## 2024-06-01 DIAGNOSIS — J4540 Moderate persistent asthma, uncomplicated: Secondary | ICD-10-CM

## 2024-06-04 ENCOUNTER — Ambulatory Visit (HOSPITAL_BASED_OUTPATIENT_CLINIC_OR_DEPARTMENT_OTHER)
Admission: RE | Admit: 2024-06-04 | Discharge: 2024-06-04 | Disposition: A | Discharge status: Home / Self Care | Source: Ambulatory Visit | Attending: Cardiology | Admitting: Cardiology

## 2024-06-04 DIAGNOSIS — I351 Nonrheumatic aortic (valve) insufficiency: Secondary | ICD-10-CM | POA: Diagnosis not present

## 2024-06-04 DIAGNOSIS — I071 Rheumatic tricuspid insufficiency: Secondary | ICD-10-CM

## 2024-06-04 LAB — ECHOCARDIOGRAM COMPLETE
AR max vel: 1.93 cm2
AV Area VTI: 2.09 cm2
AV Area mean vel: 1.88 cm2
AV Mean grad: 8 mmHg
AV Peak grad: 14 mmHg
AV Vena cont: 0.2 cm
Ao pk vel: 1.87 m/s
Area-P 1/2: 4.8 cm2
Calc EF: 65.1 %
MV M vel: 4.26 m/s
MV Peak grad: 72.6 mmHg
S' Lateral: 2.9 cm
Single Plane A2C EF: 68.6 %
Single Plane A4C EF: 61.5 %

## 2024-06-06 ENCOUNTER — Ambulatory Visit: Payer: Self-pay | Admitting: Cardiology

## 2024-06-18 DIAGNOSIS — N951 Menopausal and female climacteric states: Secondary | ICD-10-CM | POA: Diagnosis not present

## 2024-06-18 DIAGNOSIS — Z1231 Encounter for screening mammogram for malignant neoplasm of breast: Secondary | ICD-10-CM | POA: Diagnosis not present

## 2024-06-20 ENCOUNTER — Other Ambulatory Visit: Payer: Self-pay | Admitting: Obstetrics and Gynecology

## 2024-06-20 DIAGNOSIS — R928 Other abnormal and inconclusive findings on diagnostic imaging of breast: Secondary | ICD-10-CM

## 2024-07-05 ENCOUNTER — Ambulatory Visit
Admission: RE | Admit: 2024-07-05 | Discharge: 2024-07-05 | Disposition: A | Source: Ambulatory Visit | Attending: Obstetrics and Gynecology

## 2024-07-05 ENCOUNTER — Ambulatory Visit

## 2024-07-05 DIAGNOSIS — R928 Other abnormal and inconclusive findings on diagnostic imaging of breast: Secondary | ICD-10-CM

## 2024-07-18 DIAGNOSIS — E079 Disorder of thyroid, unspecified: Secondary | ICD-10-CM | POA: Diagnosis not present

## 2024-07-18 DIAGNOSIS — I10 Essential (primary) hypertension: Secondary | ICD-10-CM | POA: Diagnosis not present

## 2024-07-18 DIAGNOSIS — N951 Menopausal and female climacteric states: Secondary | ICD-10-CM | POA: Diagnosis not present

## 2024-08-10 ENCOUNTER — Ambulatory Visit: Admitting: Family

## 2024-08-10 ENCOUNTER — Other Ambulatory Visit: Payer: Self-pay | Admitting: Family

## 2024-08-10 VITALS — BP 117/55 | HR 80 | Temp 98.1°F | Ht 68.0 in | Wt 238.0 lb

## 2024-08-10 DIAGNOSIS — E538 Deficiency of other specified B group vitamins: Secondary | ICD-10-CM | POA: Diagnosis not present

## 2024-08-10 DIAGNOSIS — I10 Essential (primary) hypertension: Secondary | ICD-10-CM

## 2024-08-10 DIAGNOSIS — G43909 Migraine, unspecified, not intractable, without status migrainosus: Secondary | ICD-10-CM

## 2024-08-10 DIAGNOSIS — K219 Gastro-esophageal reflux disease without esophagitis: Secondary | ICD-10-CM

## 2024-08-10 DIAGNOSIS — E785 Hyperlipidemia, unspecified: Secondary | ICD-10-CM | POA: Diagnosis not present

## 2024-08-10 DIAGNOSIS — J4540 Moderate persistent asthma, uncomplicated: Secondary | ICD-10-CM | POA: Diagnosis not present

## 2024-08-10 DIAGNOSIS — E66812 Obesity, class 2: Secondary | ICD-10-CM

## 2024-08-10 DIAGNOSIS — J45909 Unspecified asthma, uncomplicated: Secondary | ICD-10-CM

## 2024-08-10 DIAGNOSIS — F32A Depression, unspecified: Secondary | ICD-10-CM | POA: Diagnosis not present

## 2024-08-10 DIAGNOSIS — Z6836 Body mass index (BMI) 36.0-36.9, adult: Secondary | ICD-10-CM

## 2024-08-10 DIAGNOSIS — J302 Other seasonal allergic rhinitis: Secondary | ICD-10-CM

## 2024-08-10 DIAGNOSIS — E039 Hypothyroidism, unspecified: Secondary | ICD-10-CM | POA: Diagnosis not present

## 2024-08-10 DIAGNOSIS — Z23 Encounter for immunization: Secondary | ICD-10-CM | POA: Diagnosis not present

## 2024-08-10 LAB — LIPID PANEL
Cholesterol: 198 mg/dL (ref 28–200)
HDL: 42.3 mg/dL
LDL Cholesterol: 131 mg/dL — ABNORMAL HIGH (ref 10–99)
NonHDL: 155.62
Total CHOL/HDL Ratio: 5
Triglycerides: 123 mg/dL (ref 10.0–149.0)
VLDL: 24.6 mg/dL (ref 0.0–40.0)

## 2024-08-10 LAB — BASIC METABOLIC PANEL WITH GFR
BUN: 14 mg/dL (ref 6–23)
CO2: 27 meq/L (ref 19–32)
Calcium: 9.2 mg/dL (ref 8.4–10.5)
Chloride: 102 meq/L (ref 96–112)
Creatinine, Ser: 0.69 mg/dL (ref 0.40–1.20)
GFR: 99.69 mL/min
Glucose, Bld: 96 mg/dL (ref 70–99)
Potassium: 4.1 meq/L (ref 3.5–5.1)
Sodium: 137 meq/L (ref 135–145)

## 2024-08-10 LAB — TSH: TSH: 1.75 u[IU]/mL (ref 0.35–5.50)

## 2024-08-10 MED ORDER — FLUTICASONE-SALMETEROL 250-50 MCG/ACT IN AEPB: 1.0000 | INHALATION_SPRAY | Freq: Two times a day (BID) | RESPIRATORY_TRACT | 5 refills | Status: AC

## 2024-08-10 MED ORDER — AZELASTINE-FLUTICASONE 137-50 MCG/ACT NA SUSP: 1.0000 | Freq: Two times a day (BID) | NASAL | 5 refills | Status: AC

## 2024-08-10 MED ORDER — DULOXETINE HCL 60 MG PO CPEP: 60.0000 mg | ORAL_CAPSULE | Freq: Every day | ORAL | 1 refills | Status: AC

## 2024-08-10 MED ORDER — PANTOPRAZOLE SODIUM 40 MG PO TBEC: 40.0000 mg | DELAYED_RELEASE_TABLET | Freq: Every day | ORAL | 1 refills | Status: DC

## 2024-08-10 MED ORDER — NURTEC 75 MG PO TBDP: ORAL_TABLET | ORAL | 2 refills | Status: AC

## 2024-08-10 MED ORDER — LEVOTHYROXINE SODIUM 125 MCG PO TABS: 125.0000 ug | ORAL_TABLET | Freq: Every day | ORAL | 1 refills | Status: AC

## 2024-08-10 MED ORDER — LEVOCETIRIZINE DIHYDROCHLORIDE 5 MG PO TABS: 5.0000 mg | ORAL_TABLET | Freq: Every evening | ORAL | 1 refills | Status: AC

## 2024-08-10 MED ORDER — AMLODIPINE BESYLATE 10 MG PO TABS: 10.0000 mg | ORAL_TABLET | Freq: Every day | ORAL | 1 refills | Status: AC

## 2024-08-10 NOTE — Assessment & Plan Note (Signed)
" °  Blood pressure was good today, but elevated readings have been noted in certain settings, possibly related to anxiety. - Continue amlodipine  10 mg daily. "

## 2024-08-10 NOTE — Assessment & Plan Note (Signed)
" °  Discussed weight loss options. Insurance does not cover Wegovy  or Zepbound . Wegovy  tablets available as cash pay with savings plan card. Discussed side effects and potential cardiac benefits. Denies family hx of thyroid  cancer. Emphasized long-term use for sustained weight loss. - Prescribed Wegovy  tablets 1.5 mg po daily #30 to purchase using cash with savings plan card. - Follow up in three weeks to assess progress and side effects. "

## 2024-08-10 NOTE — Assessment & Plan Note (Signed)
" °  Vitamin B12 levels maintained with regular injections. - Continue cyanocobalamin  1000 mcg/mL intramuscular every 14 days. - Refilled B12 injection prescription. "

## 2024-08-10 NOTE — Patient Instructions (Signed)
" °  VISIT SUMMARY: During your visit, we discussed your concerns about weight loss, blood pressure management, and several other health issues. We reviewed your current medications and made adjustments where necessary. We also discussed general health maintenance, including vaccinations and cancer screening.  YOUR PLAN: -OBESITY: Obesity means having an excessive amount of body fat. We discussed weight loss options and prescribed Wegovy  tablets, which you can purchase with a savings plan card. We will follow up in three weeks to assess your progress and any side effects.  -PRIMARY HYPERTENSION: Primary hypertension is high blood pressure without a known secondary cause. Your blood pressure was good today, but we noted elevated readings in certain settings, possibly due to anxiety. Continue taking amlodipine  10 mg daily.  -HYPERLIPIDEMIA: Hyperlipidemia is having high levels of fats (lipids) in your blood. We discontinued Repatha  due to mood changes and will reassess your cholesterol levels. A cholesterol panel has been ordered.  -MIGRAINE: Migraines are severe headaches often accompanied by other symptoms like nausea and sensitivity to light. Continue using Nurtec as needed for migraines. We have refilled your Nurtec prescription.  -DEPRESSION: Depression is a mood disorder characterized by persistent feelings of sadness and loss of interest. Your mood may be slightly better since starting the estrogen patch, but you do not feel it is a big improvement. Continue taking Cymbalta  60 mg daily.  -HYPOTHYROIDISM: Hypothyroidism is a condition where your thyroid  gland does not produce enough thyroid  hormone. We have ordered thyroid  function tests and refilled your Synthroid  prescription.  -GASTROESOPHAGEAL REFLUX DISEASE: Gastroesophageal reflux disease (GERD) is a condition where stomach acid frequently flows back into the tube connecting your mouth and stomach. Your reflux symptoms are controlled with  pantoprazole . Continue taking pantoprazole  40 mg daily.  -MODERATE PERSISTENT ASTHMA: Moderate persistent asthma is a type of asthma where symptoms are present daily and may affect activities. Continue taking Singulair  10 mg at bedtime, use albuterol  as needed for wheezing or shortness of breath, and use the Wixela inhaler consistently during high symptom seasons. We have refilled your Wixela inhaler prescription.  -SEASONAL ALLERGIC RHINITIS: Seasonal allergic rhinitis is an allergic reaction that occurs at specific times of the year. Continue using azelastine -fluticasone  nasal spray in the morning and at bedtime, and take Xyzal  5 mg every evening. We have refilled your azelastine -fluticasone  nasal spray prescription.  -VITAMIN B12 DEFICIENCY: Vitamin B12 deficiency occurs when there is not enough vitamin B12 in your body. Continue receiving cyanocobalamin  1000 mcg/mL intramuscular injections every 14 days. We have refilled your B12 injection prescription.  -GENERAL HEALTH MAINTENANCE: We discussed completing a Cologuard kit for col orectal cancer screening. You received a pneumonia vaccine today, and we will discuss the shingles vaccine at your next visit.  INSTRUCTIONS: Follow up in three weeks to assess your progress and any side effects from the Wegovy  tablets. Complete the Cologuard kit for colorectal cancer screening. We will discuss the shingles vaccine at your next visit.                       "

## 2024-08-10 NOTE — Assessment & Plan Note (Signed)
" °  Migraines have been a significant issue recently, with back to back episodes noted during the time patient was taking Repatha . Nurtec used as needed, with option for preventive use if frequency increases. - Continue Nurtec as needed for migraines. - Refilled Nurtec prescription.  "

## 2024-08-10 NOTE — Assessment & Plan Note (Signed)
" °  Thyroid  function last checked in July. Plan to reassess today. - Ordered thyroid  function tests. - Refilled Synthroid  prescription. "

## 2024-08-10 NOTE — Assessment & Plan Note (Signed)
" °  Reflux symptoms controlled with pantoprazole . - Continue pantoprazole  40 mg daily. - Refilled pantoprazole  prescription. "

## 2024-08-10 NOTE — Assessment & Plan Note (Signed)
" °  Symptoms controlled with azelastine -fluticasone  nasal spray and Xyzal . - Continue azelastine -fluticasone  nasal spray morning and at bedtime. - Continue Xyzal  5 mg every evening. - Refilled azelastine -fluticasone  nasal spray prescription. "

## 2024-08-10 NOTE — Assessment & Plan Note (Signed)
" °  Mood may be slightly better since starting estrogen patch, but patient does not feel it is a big improvement. Cymbalta  refilled as requested. - Continue Cymbalta  60 mg daily. "

## 2024-08-10 NOTE — Assessment & Plan Note (Signed)
" °  Repatha  discontinued due to mood changes. Plan to reassess cholesterol levels. - Ordered cholesterol panel.  "

## 2024-08-10 NOTE — Assessment & Plan Note (Signed)
" °  Asthma managed with Singulair , albuterol , and Wixela inhaler during high symptom seasons. - Continue Singulair  10 mg at bedtime. - Use albuterol  as needed for wheezing or shortness of breath. - Use Wixela inhaler consistently during high symptom seasons. - Refilled Wixela inhaler prescription.  "

## 2024-08-10 NOTE — Progress Notes (Signed)
 1  Subjective:     Patient ID: Christine [name], female    DOB: 12-17-1970, 54 y.o.   MRN: [id number]  Chief Complaint  Patient presents with   Weight Loss    Patient stated that she wanted to discuss weight loss options    HPI  Discussed the use of AI scribe software for clinical note transcription with the patient, who gave verbal consent to proceed.  History of Present Illness Christine [name] is a 54 year old female who presents with concerns about weight loss and blood pressure management.  She is concerned about her blood pressure, noting that it is often elevated during gynecologist visits but normal at home and during other medical appointments. Her amlodipine  dosage was increased from 5 mg to 10 mg in September. She monitors her blood pressure at home and experiences anxiety during certain medical visits.  She discontinued Repatha  due to mood changes, including feeling 'really down' and experiencing 'dark thoughts' and migraines. Her husband also noticed these mood changes. She associates these symptoms with the medication, as they coincided with its use. She has since started hormone therapy with an estrogen patch in December, which has slightly improved her mood and reduced hot flashes, though she has gained a few pounds.  She is concerned about her weight, noting difficulty losing weight despite efforts to walk more and eat better. Her knees are now more 'friendly' to walking, and she is trying to manage her weight alongside menopause symptoms.  She experiences migraines and has been using Nurtec as needed, which has been effective. She initially took it every other day but now uses it only when she feels a migraine coming on. She also uses Nodoz and heat therapy for relief.  She has a history of reflux, which worsens if she does not take pantoprazole . She attributes some of her reflux symptoms to her current weight.  She uses several medications for allergies and asthma,  including azelastine  nasal spray, Xyzal , and Wixela inhaler. She uses the nasal spray at night to help with snoring and takes Xyzal  year-round. She uses the Wixela inhaler as needed, particularly during seasons with frequent symptoms.  She is up to date with her flu shot and is considering a pneumonia shot. She has not had a shingles shot yet. She is concerned about her overall health, particularly her heart, due to her weight. There is no family history of thyroid  cancer.  Lab Results  Component Value Date   CHOL 195 06/01/2023   HDL 44.60 06/01/2023   LDLCALC 110 (H) 06/01/2023   LDLDIRECT 126.0 07/04/2018   TRIG 201.0 (H) 06/01/2023   CHOLHDL 4 06/01/2023   Lab Results  Component Value Date   TSH 0.77 02/07/2024         Health Maintenance Due  Topic Date Due   Zoster Vaccines- Shingrix (1 of 2) Never done   Fecal DNA (Cologuard)  Never done   COVID-19 Vaccine (3 - Pfizer risk series) 01/22/2020   Mammogram  02/27/2022    Past Medical History:  Diagnosis Date   Allergy     Anemia    stated h/o pernicious anemia   Anxiety    Aortic insufficiency    mild to moderate   Arthritis    osteoarthritis   Asthma    Acute asthmatic bronchitis 2011 while pregnant   CAD (coronary artery disease), native coronary artery    25% distal LAD, < 25% ostial-prox-mid LAD/distal left main/prox and distal RCA CTA 06/2023  Carpal tunnel syndrome    both wrist   Depression    Diverticulitis    recurrent   Family history of anesthesia complication    nausea and vomiting   Fibromyalgia    GERD (gastroesophageal reflux disease)    Hypertension 2016   Hypothyroidism    Migraine, unspecified, without mention of intractable migraine without mention of status migrainosus    Miscarriage 2007   Pneumonia    PONV (postoperative nausea and vomiting)    Thyroid  disease    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   TOTAL KNEE ARTHROPLASTY Left 10/28/2023   Procedure: ARTHROPLASTY, KNEE, TOTAL;  Surgeon: Duwayne Purchase, MD;  Location: [facility];  Service: Orthopedics;  Laterality: Left;    Family History  Problem Relation Age of Onset   Barrett's esophagus Mother    Coronary artery disease Mother    Congestive Heart Failure Mother    Hypertension Mother    Fibromyalgia Mother    Chronic fatigue Mother    Anemia Mother    Irritable bowel syndrome Mother    Atrial fibrillation Mother    Arrhythmia Mother    Heart disease Mother    Colon polyps Father    Diabetes Father    Hypertension Father    Heart disease Father    Graves' disease Maternal Grandmother    CAD Maternal Grandfather    Ovarian cancer Paternal Grandmother    Diabetes Paternal Grandfather    Ehlers-Danlos syndrome Daughter    Anemia Daughter    Other Daughter        Postural orthostatic hypotension   Irritable bowel syndrome Daughter    Diabetes Paternal Aunt    Crohn's disease Cousin    Alcohol abuse Other    Arthritis Other    Hyperlipidemia Other    Hypertension Other    Stroke Other    Coronary artery disease Other    Clotting disorder Other        Great Grandfather   Colon cancer Neg Hx     Social History   Socioeconomic History   Marital status: Married    Spouse name: Not on file   Number of children: 2   Years of education: Not on file   Highest education level: Some college, no degree  Occupational History   Not on file  Tobacco Use   Smoking status: Former    Current packs/day: 0.00    Average packs/day: 0.5 packs/day for 3.0 years (1.5 ttl pk-yrs)    Types: Cigarettes    Start date:  01/30/1997    Quit date: 01/31/2000    Years since quitting: 24.5   Smokeless tobacco: Never  Vaping Use   Vaping status: Never Used  Substance and Sexual Activity   Alcohol use: Yes    Alcohol/week: 2.0 standard drinks of alcohol    Types: 2 Glasses of wine per week    Comment: social   Drug use: No   Sexual activity: Yes    Partners: Male    Birth control/protection: I.U.D.  Other Topics Concern   Not  on file  Social History Narrative   Married, 2 daughters (49 and 7) son age 46   Occupation: Housewife   Daily Caffeine Use   Patient does not get regular exercise.   Social Drivers of Health   Tobacco Use: Medium Risk (08/10/2024)   Patient History    Smoking Tobacco Use: Former    Smokeless Tobacco Use: Never    Passive Exposure: Not on file  Financial Resource Strain: Low Risk (08/10/2024)   Overall Financial Resource Strain (CARDIA)    Difficulty of Paying Living Expenses: Not hard at all  Food Insecurity: No Food Insecurity (08/10/2024)   Epic    Worried About Programme Researcher, Broadcasting/film/video in the Last Year: Never true    Ran Out of Food in the Last Year: Never true  Transportation Needs: No Transportation Needs (08/10/2024)   Epic    Lack of Transportation (Medical): No    Lack of Transportation (Non-Medical): No  Physical Activity: Insufficiently Active (08/10/2024)   Exercise Vital Sign    Days of Exercise per Week: 2 days    Minutes of Exercise per Session: 20 min  Stress: No Stress Concern Present (08/10/2024)   [employer] of Occupational Health - Occupational Stress Questionnaire    Feeling of Stress: Only a little  Social Connections: Socially Integrated (08/10/2024)   Social Connection and Isolation Panel    Frequency of Communication with Friends and Family: More than three times a week    Frequency of Social Gatherings with Friends and Family: Twice a week    Attends Religious Services: More than 4 times per year    Active Member of Clubs or Organizations: Yes    Attends  Banker Meetings: More than 4 times per year    Marital Status: Married  Catering Manager Violence: Not At Risk (10/28/2023)   Humiliation, Afraid, Rape, and Kick questionnaire    Fear of Current or Ex-Partner: No    Emotionally Abused: No    Physically Abused: No    Sexually Abused: No  Depression (PHQ2-9): Low Risk (05/09/2024)   Depression (PHQ2-9)    PHQ-2 Score: 2  Recent Concern: Depression (PHQ2-9) - Medium Risk (04/18/2024)   Depression (PHQ2-9)    PHQ-2 Score: 5  Alcohol Screen: Low Risk (08/10/2024)   Alcohol Screen    Last Alcohol Screening Score (AUDIT): 2  Housing: Low Risk (08/10/2024)   Epic    Unable to Pay for Housing in the Last Year: No    Number of Times Moved in the Last Year: 0    Homeless in the Last Year: No  Utilities: Not At Risk (10/28/2023)   [facility] Utilities    Threatened with loss of utilities: No  Health Literacy: Not on file    Outpatient Medications Prior to Visit  Medication Sig Dispense Refill   acetaminophen  (TYLENOL ) 650 MG CR tablet Take 1,300 mg by mouth every 8 (eight) hours as needed for pain.     albuterol  (VENTOLIN  HFA) 108 (90 Base) MCG/ACT inhaler Inhale 2 puffs into the lungs every 6 (six) hours as needed for wheezing or shortness of breath. 6.7 g 3   Alum Hydroxide-Mag Carbonate (GAVISCON PO) Take 2-3 tablets by mouth 2 (two) times daily as needed (heartburn).     aspirin  EC 81 MG tablet Take 1 tablet (81 mg total) by mouth daily. Swallow whole.     cyanocobalamin  (VITAMIN B12) 1000 MCG/ML injection Inject 1 mL (1,000 mcg total) into the muscle every 14 (fourteen) days.  6 mL 6   estradiol  (VIVELLE -DOT) 0.025 MG/24HR Place 1 patch onto the skin 2 (two) times a week.     Levonorgestrel  (MIRENA , 52 MG, IU) by Intrauterine route.     loperamide  (IMODIUM  A-D) 2 MG tablet Take 2-4 mg by mouth 4 (four) times daily as needed for diarrhea or loose stools.     montelukast  (SINGULAIR ) 10 MG tablet TAKE 1 TABLET BY MOUTH AT BEDTIME 90 tablet  1   Multiple Vitamins-Minerals (HAIR SKIN AND NAILS FORMULA) TABS Take 2 tablets by mouth daily.     Probiotic Product (PROBIOTIC MULTI-ENZYME) TABS Take 1 tablet by mouth daily as needed (upset stomach).     SYRINGE-NEEDLE, DISP, 3 ML (BD INTEGRA SYRINGE) 25G X 1 3 ML MISC USE AS DIRECTED 12 each 0   amLODipine  (NORVASC ) 10 MG tablet Take 1 tablet (10 mg total) by mouth daily. 90 tablet 1   Azelastine -Fluticasone  137-50 MCG/ACT SUSP Place 1 spray into the nose in the morning and at bedtime. 23 g 5   DULoxetine  (CYMBALTA ) 60 MG capsule Take 1 capsule (60 mg total) by mouth daily. 90 capsule 1   fluticasone -salmeterol (WIXELA INHUB) 250-50 MCG/ACT AEPB Inhale 1 puff into the lungs in the morning and at bedtime. 1 each 5   levocetirizine (XYZAL ) 5 MG tablet Take 1 tablet (5 mg total) by mouth every evening. 90 tablet 1   levothyroxine  (SYNTHROID ) 125 MCG tablet Take 1 tablet (125 mcg total) by mouth daily before breakfast. 90 tablet 1   pantoprazole  (PROTONIX ) 40 MG tablet Take 1 tablet (40 mg total) by mouth daily. 90 tablet 1   Rimegepant Sulfate (NURTEC) 75 MG TBDP One tablet by mouth once daily as needed for migraine 30 tablet 0   No facility-administered medications prior to visit.    Allergies[1]  ROS    See HPI Objective:    Physical Exam Constitutional:      General: She is not in acute distress.    Appearance: Normal appearance. She is well-developed.  HENT:     Head: Normocephalic and atraumatic.     Right Ear: External ear normal.     Left Ear: External ear normal.  Eyes:     General: No scleral icterus. Neck:     Thyroid : No thyromegaly.  Cardiovascular:     Rate and Rhythm: Normal rate and regular rhythm.     Heart sounds: Normal heart sounds. No murmur heard. Pulmonary:     Effort: Pulmonary effort is normal. No respiratory distress.     Breath sounds: Normal breath sounds. No wheezing.  Musculoskeletal:     Cervical back: Neck supple.  Skin:    General: Skin  is warm and dry.  Neurological:     Mental Status: She is alert and oriented to person, place, and time.  Psychiatric:        Mood and Affect: Mood normal.        Behavior: Behavior normal.        Thought Content: Thought content normal.        Judgment: Judgment normal.      BP (!) 117/55 (BP Location: Right Arm, Patient Position: Sitting, Cuff Size: Large)   Pulse 80   Temp 98.1 F (36.7 C) (Oral)   Ht 5' 8 (1.727 m)   Wt 238 lb (108 kg)   SpO2 99%   BMI 36.19 kg/m  Wt Readings from Last 3 Encounters:  08/10/24 238 lb (108 kg)  05/09/24 233 lb 9.6 oz (  106 kg)  04/18/24 234 lb 3.2 oz (106.2 kg)       Assessment & Plan:   Problem List Items Addressed This Visit       Unprioritized   Seasonal allergies    Symptoms controlled with azelastine -fluticasone  nasal spray and Xyzal . - Continue azelastine -fluticasone  nasal spray morning and at bedtime. - Continue Xyzal  5 mg every evening. - Refilled azelastine -fluticasone  nasal spray prescription.      Relevant Medications   Azelastine -Fluticasone  137-50 MCG/ACT SUSP   levocetirizine (XYZAL ) 5 MG tablet   Primary hypertension    Blood pressure was good today, but elevated readings have been noted in certain settings, possibly related to anxiety. - Continue amlodipine  10 mg daily.      Relevant Medications   amLODipine  (NORVASC ) 10 MG tablet   Other Relevant Orders   Basic Metabolic Panel (BMET)   Obesity due to excess calories    Discussed weight loss options. Insurance does not cover Wegovy  or Zepbound . Wegovy  tablets available as cash pay with savings plan card. Discussed side effects and potential cardiac benefits. Denies family hx of thyroid  cancer. Emphasized long-term use for sustained weight loss. - Prescribed Wegovy  tablets 1.5 mg po daily #30 to purchase using cash with savings plan card. - Follow up in three weeks to assess progress and side effects.      Moderate persistent asthma    Asthma managed  with Singulair , albuterol , and Wixela inhaler during high symptom seasons. - Continue Singulair  10 mg at bedtime. - Use albuterol  as needed for wheezing or shortness of breath. - Use Wixela inhaler consistently during high symptom seasons. - Refilled Wixela inhaler prescription.       Relevant Medications   fluticasone -salmeterol (WIXELA INHUB) 250-50 MCG/ACT AEPB   Migraine without status migrainosus, not intractable    Migraines have been a significant issue recently, with back to back episodes noted during the time patient was taking Repatha . Nurtec used as needed, with option for preventive use if frequency increases. - Continue Nurtec as needed for migraines. - Refilled Nurtec prescription.       Relevant Medications   DULoxetine  (CYMBALTA ) 60 MG capsule   Rimegepant Sulfate (NURTEC) 75 MG TBDP   amLODipine  (NORVASC ) 10 MG tablet   Hypothyroidism    Thyroid  function last checked in July. Plan to reassess today. - Ordered thyroid  function tests. - Refilled Synthroid  prescription.      Relevant Medications   levothyroxine  (SYNTHROID ) 125 MCG tablet   Other Relevant Orders   TSH   Hyperlipidemia - Primary    Repatha  discontinued due to mood changes. Plan to reassess cholesterol levels. - Ordered cholesterol panel.       Relevant Medications   amLODipine  (NORVASC ) 10 MG tablet   Other Relevant Orders   Lipid panel   GASTROESOPHAGEAL REFLUX DISEASE    Reflux symptoms controlled with pantoprazole . - Continue pantoprazole  40 mg daily. - Refilled pantoprazole  prescription.      Relevant Medications   pantoprazole  (PROTONIX ) 40 MG tablet   Depression    Mood may be slightly better since starting estrogen patch, but patient does not feel it is a big improvement. Cymbalta  refilled as requested. - Continue Cymbalta  60 mg daily.      Relevant Medications   DULoxetine  (CYMBALTA ) 60 MG capsule   B12 deficiency    Vitamin B12 levels maintained with regular  injections. - Continue cyanocobalamin  1000 mcg/mL intramuscular every 14 days. - Refilled B12 injection prescription.      Other Visit Diagnoses  Uncomplicated asthma, unspecified asthma severity, unspecified whether persistent       Relevant Medications   fluticasone -salmeterol (WIXELA INHUB) 250-50 MCG/ACT AEPB     Need for pneumococcal 20-valent conjugate vaccination       Relevant Orders   Pneumococcal conjugate vaccine 20-valent (Prevnar 20) (Completed)      Assessment & Plan   General health maintenance Discussed Cologuard kit for colorectal cancer screening. Pneumonia vaccine due and well-tolerated. Shingles vaccine declined for now. - Complete Cologuard kit for colorectal cancer screening. - Administered pneumonia vaccine. - Will discuss shingles vaccine at next visit.    I am having Evalene KYM Galloway maintain her (Levonorgestrel  (MIRENA , 52 MG, IU)), albuterol , acetaminophen , Hair Skin and Nails Formula, Alum Hydroxide-Mag Carbonate (GAVISCON PO), loperamide , Probiotic Multi-Enzyme, cyanocobalamin , aspirin  EC, BD Integra Syringe, montelukast , estradiol , DULoxetine , pantoprazole , levothyroxine , Nurtec, amLODipine , Azelastine -Fluticasone , levocetirizine, and fluticasone -salmeterol.  Meds ordered this encounter  Medications   DULoxetine  (CYMBALTA ) 60 MG capsule    Sig: Take 1 capsule (60 mg total) by mouth daily.    Dispense:  90 capsule    Refill:  1    Supervising Provider:   DOMENICA BLACKBIRD A [[id number]]   pantoprazole  (PROTONIX ) 40 MG tablet    Sig: Take 1 tablet (40 mg total) by mouth daily.    Dispense:  90 tablet    Refill:  1    Supervising Provider:   DOMENICA BLACKBIRD A [[id number]]   levothyroxine  (SYNTHROID ) 125 MCG tablet    Sig: Take 1 tablet (125 mcg total) by mouth daily before breakfast.    Dispense:  90 tablet    Refill:  1    Supervising Provider:   DOMENICA BLACKBIRD A [[id number]]   Rimegepant Sulfate (NURTEC) 75 MG TBDP    Sig: One tablet by mouth once daily as  needed for migraine    Dispense:  30 tablet    Refill:  2    Supervising Provider:   DOMENICA BLACKBIRD A [[id number]]   amLODipine  (NORVASC ) 10 MG tablet    Sig: Take 1 tablet (10 mg total) by mouth daily.    Dispense:  90 tablet    Refill:  1    Supervising Provider:   DOMENICA BLACKBIRD A [[id number]]   Azelastine -Fluticasone  137-50 MCG/ACT SUSP    Sig: Place 1 spray into the nose in the morning and at bedtime.    Dispense:  23 g    Refill:  5    Supervising Provider:   DOMENICA BLACKBIRD A [[id number]]   levocetirizine (XYZAL ) 5 MG tablet    Sig: Take 1 tablet (5 mg total) by mouth every evening.    Dispense:  90 tablet    Refill:  1    Supervising Provider:   DOMENICA BLACKBIRD A [[id number]]   fluticasone -salmeterol (WIXELA INHUB) 250-50 MCG/ACT AEPB    Sig: Inhale 1 puff into the lungs in the morning and at bedtime.    Dispense:  1 each    Refill:  5    Supervising Provider:   DOMENICA BLACKBIRD A [[id number]]      [1]  Allergies Allergen Reactions   Hydrocodone  Nausea Only and Other (See Comments)    Hallucination    Sulfa Antibiotics Rash   Dilaudid  [Hydromorphone  Hcl]     Family history of having a reaction to this drug   Statins     Lip swelling   Latex Rash   Penicillins Rash and Other (See Comments)    Childhood allergy 

## 2024-08-17 ENCOUNTER — Other Ambulatory Visit (HOSPITAL_COMMUNITY): Payer: Self-pay

## 2024-08-20 ENCOUNTER — Telehealth: Payer: Self-pay

## 2024-08-20 ENCOUNTER — Other Ambulatory Visit (HOSPITAL_COMMUNITY): Payer: Self-pay

## 2024-08-20 NOTE — Telephone Encounter (Signed)
 Pharmacy Patient Advocate Encounter  Received notification from [facility] that Prior Authorization for Pantoprazole  Sodium 40MG  dr tablets  has been APPROVED from 08/20/2024 to 08/20/2025. Ran test claim, Copay is $1.69. This test claim was processed through [facility]- copay amounts may vary at other pharmacies due to pharmacy/plan contracts, or as the patient moves through the different stages of their insurance plan.   PA #/Case ID/Reference #: [id number]

## 2024-08-20 NOTE — Telephone Encounter (Signed)
 Pharmacy Patient Advocate Encounter   Received notification from Physician's Office that prior authorization for Pantoprazole  Sodium 40MG  dr tablets  is required/requested.   Insurance verification completed.   The patient is insured through [facility].   Per test claim: PA required; PA submitted to above mentioned insurance via Latent Key/confirmation #/[id number] Status is pending

## 2024-08-24 ENCOUNTER — Other Ambulatory Visit: Payer: Self-pay

## 2024-08-24 ENCOUNTER — Other Ambulatory Visit (HOSPITAL_BASED_OUTPATIENT_CLINIC_OR_DEPARTMENT_OTHER): Payer: Self-pay

## 2024-08-24 MED ORDER — WEGOVY 1.5 MG PO TABS
1.5000 mg | ORAL_TABLET | Freq: Every day | ORAL | 1 refills | Status: AC
  Filled 2024-08-24: qty 30, 30d supply, fill #0

## 2024-08-27 ENCOUNTER — Encounter: Payer: Self-pay | Admitting: Family

## 2024-09-06 ENCOUNTER — Encounter: Payer: Self-pay | Admitting: Family

## 2024-09-06 DIAGNOSIS — E538 Deficiency of other specified B group vitamins: Secondary | ICD-10-CM

## 2024-09-06 MED ORDER — "BD INTEGRA SYRINGE 25G X 1"" 3 ML MISC": 0 refills | Status: AC

## 2024-10-03 ENCOUNTER — Encounter: Admitting: Family
